# Patient Record
Sex: Female | Born: 2000
Health system: Southern US, Community
[De-identification: ages and names within clinical notes are randomized; demographics above are authoritative.]

## PROBLEM LIST (undated history)

## (undated) DIAGNOSIS — F419 Anxiety disorder, unspecified: Secondary | ICD-10-CM

## (undated) DIAGNOSIS — K760 Fatty (change of) liver, not elsewhere classified: Secondary | ICD-10-CM

## (undated) DIAGNOSIS — L309 Dermatitis, unspecified: Secondary | ICD-10-CM

## (undated) DIAGNOSIS — J069 Acute upper respiratory infection, unspecified: Secondary | ICD-10-CM

## (undated) DIAGNOSIS — L509 Urticaria, unspecified: Secondary | ICD-10-CM

## (undated) DIAGNOSIS — T7840XA Allergy, unspecified, initial encounter: Secondary | ICD-10-CM

## (undated) DIAGNOSIS — F909 Attention-deficit hyperactivity disorder, unspecified type: Secondary | ICD-10-CM

## (undated) DIAGNOSIS — T783XXA Angioneurotic edema, initial encounter: Secondary | ICD-10-CM

## (undated) HISTORY — PX: TONSILLECTOMY: SUR1361

## (undated) HISTORY — DX: Allergy, unspecified, initial encounter: T78.40XA

## (undated) HISTORY — DX: Dermatitis, unspecified: L30.9

## (undated) HISTORY — DX: Anxiety disorder, unspecified: F41.9

## (undated) HISTORY — DX: Fatty (change of) liver, not elsewhere classified: K76.0

## (undated) HISTORY — PX: UPPER GASTROINTESTINAL ENDOSCOPY: SHX188

## (undated) HISTORY — PX: MYRINGOTOMY WITH TUBE PLACEMENT: SHX5663

## (undated) HISTORY — DX: Urticaria, unspecified: L50.9

## (undated) HISTORY — PX: TYMPANOSTOMY TUBE PLACEMENT: SHX32

## (undated) HISTORY — DX: Angioneurotic edema, initial encounter: T78.3XXA

## (undated) HISTORY — PX: ADENOIDECTOMY: SUR15

## (undated) HISTORY — DX: Acute upper respiratory infection, unspecified: J06.9

## (undated) HISTORY — PX: OTHER SURGICAL HISTORY: SHX169

---
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
## 2018-01-31 ENCOUNTER — Encounter: Payer: Self-pay | Admitting: Neurology

## 2018-01-31 ENCOUNTER — Ambulatory Visit: Payer: 59 | Admitting: Neurology

## 2018-01-31 VITALS — BP 104/69 | HR 61 | Ht 60.0 in | Wt 165.0 lb

## 2018-01-31 DIAGNOSIS — R202 Paresthesia of skin: Secondary | ICD-10-CM | POA: Diagnosis not present

## 2018-01-31 NOTE — Progress Notes (Signed)
PATIENT: Leslie [name] DOB: 09-May-2001  Chief Complaint  Patient presents with  . Skin Sensations    She is here with her mother, Leslie [name].  Reports extreme cold sensations in her feet for the last several years - significant enough to cause pain.    Marland Kitchen. PCP    Leslie AstersSummer, Jennifer, MD     HISTORICAL  Leslie [name] is 17 years old female, seen in refer by her primary care doctor Summer, Leslie [name] for evaluation of feeling cold in her feet for the last several years, initial evaluation was on January 31, 2018, she is accompanied by her mother at today's clinical visit.  She has past medical history of ADHD, began to take medication since 6 grade, initially was treated with Concerta, since 2017, it was changed to Vyvanse  She began to feel bilateral feet coldness sensation around 2017, especially in cold weather, it is to the point of painful sensation, she has to get up and walk around, which helps some, she often feels coldness in her feet even during the summertime.  She denies bilateral hands coldness sensation, denies neck pain, low back pain, denies gait abnormality, denies bowel and bladder incontinence.  She plays volleyball regularly, denies difficulty  REVIEW OF SYSTEMS: Full 14 system review of systems performed and notable only for as above  ALLERGIES: No Known Allergies  HOME MEDICATIONS: Current Outpatient Medications  Medication Sig Dispense Refill  . lisdexamfetamine (VYVANSE) 40 MG capsule Take 40 mg by mouth every morning.     No current facility-administered medications for this visit.     PAST MEDICAL HISTORY: Past Medical History:  Diagnosis Date  . ADHD (attention deficit hyperactivity disorder)     PAST SURGICAL HISTORY: Past Surgical History:  Procedure Laterality Date  . MYRINGOTOMY WITH TUBE PLACEMENT    . TONSILLECTOMY    . tubes    . wisdom teeth      FAMILY HISTORY: Family History  Adopted: Yes    SOCIAL HISTORY:  Social  History   Socioeconomic History  . Marital status: Single    Spouse name: Not on file  . Number of children: 0  . Years of education: 1512  . Highest education level: Not on file  Social Needs  . Financial resource strain: Not on file  . Food insecurity - worry: Not on file  . Food insecurity - inability: Not on file  . Transportation needs - medical: Not on file  . Transportation needs - non-medical: Not on file  Occupational History  . Occupation: Consulting civil engineertudent  Tobacco Use  . Smoking status: Never Smoker  . Smokeless tobacco: Never Used  Substance and Sexual Activity  . Alcohol use: No    Alcohol/week: 0.0 oz  . Drug use: No  . Sexual activity: Not on file  Other Topics Concern  . Not on file  Social History Narrative   Lives at home with parents.   Right-handed.   2 sodas per day.         PHYSICAL EXAM   Vitals:   01/31/18 0817  BP: 104/69  Pulse: 61  Weight: 165 lb (74.8 kg)  Height: 5' (1.524 m)    Not recorded      Body mass index is 32.22 kg/m.  PHYSICAL EXAMNIATION:  Gen: NAD, conversant, well nourised, obese, well groomed                     Cardiovascular: Regular rate rhythm, no peripheral edema,  warm, nontender. Eyes: Conjunctivae clear without exudates or hemorrhage Neck: Supple, no carotid bruits. Pulmonary: Clear to auscultation bilaterally   NEUROLOGICAL EXAM:  MENTAL STATUS: Speech:    Speech is normal; fluent and spontaneous with normal comprehension.  Cognition:     Orientation to time, place and person     Normal recent and remote memory     Normal Attention span and concentration     Normal Language, naming, repeating,spontaneous speech     Fund of knowledge   CRANIAL NERVES: CN II: Visual fields are full to confrontation. Fundoscopic exam is normal with sharp discs and no vascular changes. Pupils are round equal and briskly reactive to light. CN III, IV, VI: extraocular movement are normal. No ptosis. CN V: Facial sensation is  intact to pinprick in all 3 divisions bilaterally. Corneal responses are intact.  CN VII: Face is symmetric with normal eye closure and smile. CN VIII: Hearing is normal to rubbing fingers CN IX, X: Palate elevates symmetrically. Phonation is normal. CN XI: Head turning and shoulder shrug are intact CN XII: Tongue is midline with normal movements and no atrophy.  MOTOR: There is no pronator drift of out-stretched arms. Muscle bulk and tone are normal. Muscle strength is normal.  REFLEXES: Reflexes are 2+ and symmetric at the biceps, triceps, knees, and ankles. Plantar responses are flexor.  SENSORY: Intact to light touch, pinprick, positional sensation and vibratory sensation are intact in fingers and toes.  COORDINATION: Rapid alternating movements and fine finger movements are intact. There is no dysmetria on finger-to-nose and heel-knee-shin.    GAIT/STANCE: Posture is normal. Gait is steady with normal steps, base, arm swing, and turning. Heel and toe walking are normal. Tandem gait is normal.  Romberg is absent.   DIAGNOSTIC DATA (LABS, IMAGING, TESTING) - I reviewed patient records, labs, notes, testing and imaging myself where available.   ASSESSMENT AND PLAN  Leslie [name] is a 17 y.o. female   Bilateral feet feeling cold  Laboratory evaluation to rule out common etiology such as anemia, thyroid malfunction, also check inflammatory markers  She has normal neurological examination, there is no evidence of peripheral neuropathy  I will call her for laboratory results    Leslie [name], M.D. Ph.D.  [facility] [address] [place], [place] [address] Ph: [phone] Fax: [phone]  [id number], Leslie Dike, MD

## 2018-02-01 ENCOUNTER — Telehealth: Payer: Self-pay | Admitting: Neurology

## 2018-02-01 LAB — CBC WITH DIFFERENTIAL/PLATELET
Basophils Absolute: 0.1 10*3/uL (ref 0.0–0.2)
Basos: 1 %
EOS (ABSOLUTE): 0.2 10*3/uL (ref 0.0–0.4)
Eos: 2 %
HEMOGLOBIN: 13.9 g/dL (ref 11.1–15.9)
Hematocrit: 41.6 % (ref 34.0–46.6)
Immature Grans (Abs): 0 10*3/uL (ref 0.0–0.1)
Immature Granulocytes: 0 %
LYMPHS ABS: 2.9 10*3/uL (ref 0.7–3.1)
Lymphs: 31 %
MCH: 29 pg (ref 26.6–33.0)
MCHC: 33.4 g/dL (ref 31.5–35.7)
MCV: 87 fL (ref 79–97)
MONOCYTES: 7 %
MONOS ABS: 0.7 10*3/uL (ref 0.1–0.9)
NEUTROS ABS: 5.7 10*3/uL (ref 1.4–7.0)
Neutrophils: 59 %
Platelets: 270 10*3/uL (ref 150–379)
RBC: 4.79 x10E6/uL (ref 3.77–5.28)
RDW: 14.6 % (ref 12.3–15.4)
WBC: 9.7 10*3/uL (ref 3.4–10.8)

## 2018-02-01 LAB — COMPREHENSIVE METABOLIC PANEL
ALBUMIN: 4.5 g/dL (ref 3.5–5.5)
ALT: 27 IU/L (ref 0–32)
AST: 22 IU/L (ref 0–40)
Albumin/Globulin Ratio: 1.7 (ref 1.2–2.2)
Alkaline Phosphatase: 97 IU/L (ref 43–101)
BILIRUBIN TOTAL: 0.3 mg/dL (ref 0.0–1.2)
BUN/Creatinine Ratio: 20 (ref 9–23)
BUN: 13 mg/dL (ref 6–20)
CHLORIDE: 103 mmol/L (ref 96–106)
CO2: 24 mmol/L (ref 20–29)
Calcium: 9.5 mg/dL (ref 8.7–10.2)
Creatinine, Ser: 0.66 mg/dL (ref 0.57–1.00)
GFR calc Af Amer: 149 mL/min/{1.73_m2} (ref >59)
GFR calc non Af Amer: 129 mL/min/{1.73_m2} (ref >59)
GLUCOSE: 89 mg/dL (ref 65–99)
Globulin, Total: 2.7 g/dL (ref 1.5–4.5)
Potassium: 4.1 mmol/L (ref 3.5–5.2)
SODIUM: 141 mmol/L (ref 134–144)
Total Protein: 7.2 g/dL (ref 6.0–8.5)

## 2018-02-01 LAB — ANA W/REFLEX IF POSITIVE
Anti JO-1: <0.2 AI (ref 0.0–0.9)
Anti Nuclear Antibody(ANA): POSITIVE — AB
Chromatin Ab SerPl-aCnc: 3 AI — ABNORMAL HIGH (ref 0.0–0.9)
DSDNA AB: 1 [IU]/mL (ref 0–9)
ENA SM Ab Ser-aCnc: 0.2 AI (ref 0.0–0.9)
ENA SSA (RO) Ab: 0.6 AI (ref 0.0–0.9)
ENA SSB (LA) Ab: <0.2 AI (ref 0.0–0.9)
Scleroderma SCL-70: <0.2 AI (ref 0.0–0.9)

## 2018-02-01 LAB — VITAMIN D 25 HYDROXY (VIT D DEFICIENCY, FRACTURES): Vit D, 25-Hydroxy: 13.4 ng/mL — ABNORMAL LOW (ref 30.0–100.0)

## 2018-02-01 LAB — VITAMIN B12: VITAMIN B 12: 155 pg/mL — AB (ref 232–1245)

## 2018-02-01 LAB — FERRITIN: Ferritin: 27 ng/mL (ref 15–77)

## 2018-02-01 LAB — RPR: RPR: NONREACTIVE

## 2018-02-01 LAB — C-REACTIVE PROTEIN: CRP: 1.8 mg/L (ref 0.0–4.9)

## 2018-02-01 LAB — SEDIMENTATION RATE: Sed Rate: 2 mm/hr (ref 0–32)

## 2018-02-01 NOTE — Telephone Encounter (Signed)
Please call patient, laboratory evaluation showed B12 deficiency, and vitamin D deficiency, level 13.  Vitamin B12 level was 155, she would benefit Vit B12 im supplement daily xone week, then weekly for one month.  Vit D3  supplement 1000 units, 3 tabs daily  Rest of the laboratory evaluation showed no significant abnormality.

## 2018-02-02 NOTE — Telephone Encounter (Signed)
Spoke with mother Boone MasterCherrie and reviewed below lab results, rec. for B12 inj. and Vit. D3 3,000iu once daily.  She verbalized understanding of same; first B12 appt. sched. for 02/06/18/fim

## 2018-02-05 NOTE — Telephone Encounter (Signed)
Patient is only to follow up with Dr. Terrace ArabiaYan if symptoms fail to improve (normal neuro exam).  Otherwise, she will continue care with her PCP.  I will fax over her lab results and recommended vitamin B12 injection schedule.  We have spoken with her mother, Harlow OhmsCherrie Arrowood on HIPAA, who is aware of this plan.

## 2018-02-06 ENCOUNTER — Ambulatory Visit: Payer: Self-pay

## 2018-02-07 MED ORDER — CYANOCOBALAMIN 1000 MCG/ML IJ SOLN: 1000.0000 ug | Freq: Once | INTRAMUSCULAR | 0 refills | Status: DC

## 2018-02-07 NOTE — Addendum Note (Signed)
Addended by: Lindell SparKIRKMAN, MICHELLE C on: 02/07/2018 05:09 PM   Modules accepted: Orders

## 2018-02-07 NOTE — Telephone Encounter (Signed)
Spoke to patient's mother - she will come to our office tomorrow for her first B12 injection.  She then plans to come back next week and will have injections Monday - Thursday.  After next week, she will start weekly injections for four weeks then monthly injections for one year.   Her mother plans to get her established with an adult PCP.  She is currently still under the care of her pediatrician.

## 2018-02-07 NOTE — Telephone Encounter (Signed)
Pt's mother called PCP does not give B12 injections. Please call to advise [phone]

## 2018-02-08 ENCOUNTER — Ambulatory Visit: Payer: 59 | Admitting: *Deleted

## 2018-02-08 ENCOUNTER — Other Ambulatory Visit: Payer: Self-pay | Admitting: *Deleted

## 2018-02-08 ENCOUNTER — Encounter: Payer: Self-pay | Admitting: *Deleted

## 2018-02-08 DIAGNOSIS — E538 Deficiency of other specified B group vitamins: Secondary | ICD-10-CM

## 2018-02-08 MED ORDER — CYANOCOBALAMIN 1000 MCG/ML IJ SOLN
1000.0000 ug | Freq: Once | INTRAMUSCULAR | Status: AC
  Administered 2018-02-08: 1000 ug via INTRAMUSCULAR

## 2018-02-08 MED ORDER — CYANOCOBALAMIN 1000 MCG/ML IJ SOLN
1000.0000 ug | INTRAMUSCULAR | Status: AC
  Administered 2018-02-21 – 2018-03-14 (×4): 1000 ug via INTRAMUSCULAR

## 2018-02-08 MED ORDER — CYANOCOBALAMIN 1000 MCG/ML IJ SOLN
1000.0000 ug | Freq: Every day | INTRAMUSCULAR | Status: AC
  Administered 2018-02-12 – 2018-02-15 (×4): 1000 ug via INTRAMUSCULAR

## 2018-02-08 MED ORDER — CYANOCOBALAMIN 1000 MCG/ML IJ SOLN
1000.0000 ug | INTRAMUSCULAR | Status: DC
  Administered 2018-04-13 – 2018-05-10 (×2): 1000 ug via INTRAMUSCULAR

## 2018-02-12 ENCOUNTER — Ambulatory Visit (INDEPENDENT_AMBULATORY_CARE_PROVIDER_SITE_OTHER): Payer: 59

## 2018-02-12 DIAGNOSIS — E538 Deficiency of other specified B group vitamins: Secondary | ICD-10-CM | POA: Diagnosis not present

## 2018-02-13 ENCOUNTER — Ambulatory Visit (INDEPENDENT_AMBULATORY_CARE_PROVIDER_SITE_OTHER): Payer: 59 | Admitting: *Deleted

## 2018-02-13 DIAGNOSIS — E538 Deficiency of other specified B group vitamins: Secondary | ICD-10-CM | POA: Diagnosis not present

## 2018-02-14 ENCOUNTER — Ambulatory Visit (INDEPENDENT_AMBULATORY_CARE_PROVIDER_SITE_OTHER): Payer: 59 | Admitting: *Deleted

## 2018-02-14 DIAGNOSIS — E538 Deficiency of other specified B group vitamins: Secondary | ICD-10-CM

## 2018-02-15 ENCOUNTER — Ambulatory Visit (INDEPENDENT_AMBULATORY_CARE_PROVIDER_SITE_OTHER): Payer: 59 | Admitting: *Deleted

## 2018-02-15 DIAGNOSIS — E538 Deficiency of other specified B group vitamins: Secondary | ICD-10-CM

## 2018-02-21 ENCOUNTER — Ambulatory Visit (INDEPENDENT_AMBULATORY_CARE_PROVIDER_SITE_OTHER): Payer: 59

## 2018-02-21 DIAGNOSIS — E538 Deficiency of other specified B group vitamins: Secondary | ICD-10-CM | POA: Diagnosis not present

## 2018-02-28 ENCOUNTER — Ambulatory Visit (INDEPENDENT_AMBULATORY_CARE_PROVIDER_SITE_OTHER): Payer: 59

## 2018-02-28 DIAGNOSIS — E538 Deficiency of other specified B group vitamins: Secondary | ICD-10-CM

## 2018-03-07 ENCOUNTER — Ambulatory Visit (INDEPENDENT_AMBULATORY_CARE_PROVIDER_SITE_OTHER): Payer: 59 | Admitting: *Deleted

## 2018-03-07 DIAGNOSIS — E538 Deficiency of other specified B group vitamins: Secondary | ICD-10-CM | POA: Diagnosis not present

## 2018-03-14 ENCOUNTER — Ambulatory Visit (INDEPENDENT_AMBULATORY_CARE_PROVIDER_SITE_OTHER): Payer: 59 | Admitting: *Deleted

## 2018-03-14 DIAGNOSIS — E538 Deficiency of other specified B group vitamins: Secondary | ICD-10-CM | POA: Diagnosis not present

## 2018-04-12 DIAGNOSIS — Z23 Encounter for immunization: Secondary | ICD-10-CM | POA: Diagnosis not present

## 2018-04-12 DIAGNOSIS — F902 Attention-deficit hyperactivity disorder, combined type: Secondary | ICD-10-CM | POA: Diagnosis not present

## 2018-04-13 ENCOUNTER — Ambulatory Visit (INDEPENDENT_AMBULATORY_CARE_PROVIDER_SITE_OTHER): Payer: 59 | Admitting: *Deleted

## 2018-04-13 DIAGNOSIS — E538 Deficiency of other specified B group vitamins: Secondary | ICD-10-CM

## 2018-04-24 MED FILL — VYVANSE 50 MG CAPSULE: 50 | 30 days supply | Qty: 30 | Fill #0

## 2018-05-10 ENCOUNTER — Ambulatory Visit (INDEPENDENT_AMBULATORY_CARE_PROVIDER_SITE_OTHER): Payer: 59 | Admitting: *Deleted

## 2018-05-10 ENCOUNTER — Other Ambulatory Visit: Payer: Self-pay | Admitting: Neurology

## 2018-05-10 DIAGNOSIS — E538 Deficiency of other specified B group vitamins: Secondary | ICD-10-CM | POA: Insufficient documentation

## 2018-05-10 NOTE — Progress Notes (Signed)
Order for B12, folic acid, and methylmalonic acid level

## 2018-05-10 NOTE — Progress Notes (Signed)
Gave  Vitmain B12 1066mcg/ml in left deltoid. Cleaned with alcohol wipe prior to injection. Band-aid applied. Pt tolerated well.

## 2018-05-13 LAB — FOLATE: Folate: 5.9 ng/mL (ref >3.0)

## 2018-05-13 LAB — METHYLMALONIC ACID, SERUM: METHYLMALONIC ACID: 173 nmol/L (ref 0–378)

## 2018-05-13 LAB — VITAMIN B12: Vitamin B-12: 389 pg/mL (ref 232–1245)

## 2018-05-14 ENCOUNTER — Telehealth: Payer: Self-pay | Admitting: *Deleted

## 2018-05-14 NOTE — Telephone Encounter (Signed)
-----   Message from Levert Feinstein, MD sent at 05/14/2018  5:29 PM EDT ----- Please call patient for normal Vit B12 result.

## 2018-05-15 ENCOUNTER — Encounter: Payer: Self-pay | Admitting: *Deleted

## 2018-05-15 NOTE — Telephone Encounter (Signed)
Dr. Terrace Arabia would like patient to take oral supplements of the following:  1) vitamin B12 1000 mcg per day 2) vitamin D3 2000 units per day  Spoke to patient's mother on DPR.  She is aware of the results and will purchase the recommended supplements for her daughter to start.

## 2018-06-11 ENCOUNTER — Ambulatory Visit: Payer: 59

## 2018-06-18 DIAGNOSIS — F900 Attention-deficit hyperactivity disorder, predominantly inattentive type: Secondary | ICD-10-CM | POA: Diagnosis not present

## 2018-08-07 DIAGNOSIS — Z68.41 Body mass index (BMI) pediatric, greater than or equal to 95th percentile for age: Secondary | ICD-10-CM | POA: Diagnosis not present

## 2018-08-07 DIAGNOSIS — L700 Acne vulgaris: Secondary | ICD-10-CM | POA: Diagnosis not present

## 2018-08-07 DIAGNOSIS — Z0001 Encounter for general adult medical examination with abnormal findings: Secondary | ICD-10-CM | POA: Diagnosis not present

## 2018-08-07 DIAGNOSIS — Z13220 Encounter for screening for lipoid disorders: Secondary | ICD-10-CM | POA: Diagnosis not present

## 2018-08-07 DIAGNOSIS — Z1331 Encounter for screening for depression: Secondary | ICD-10-CM | POA: Diagnosis not present

## 2018-08-07 DIAGNOSIS — F900 Attention-deficit hyperactivity disorder, predominantly inattentive type: Secondary | ICD-10-CM | POA: Diagnosis not present

## 2018-08-07 DIAGNOSIS — Z713 Dietary counseling and surveillance: Secondary | ICD-10-CM | POA: Diagnosis not present

## 2018-08-07 MED FILL — VYVANSE 50 MG CAPSULE: 50 | 30 days supply | Qty: 30 | Fill #0

## 2018-09-19 DIAGNOSIS — H5213 Myopia, bilateral: Secondary | ICD-10-CM | POA: Diagnosis not present

## 2018-10-18 DIAGNOSIS — F900 Attention-deficit hyperactivity disorder, predominantly inattentive type: Secondary | ICD-10-CM | POA: Diagnosis not present

## 2018-10-18 DIAGNOSIS — F902 Attention-deficit hyperactivity disorder, combined type: Secondary | ICD-10-CM | POA: Diagnosis not present

## 2018-10-18 DIAGNOSIS — J157 Pneumonia due to Mycoplasma pneumoniae: Secondary | ICD-10-CM | POA: Diagnosis not present

## 2018-10-18 DIAGNOSIS — E559 Vitamin D deficiency, unspecified: Secondary | ICD-10-CM | POA: Diagnosis not present

## 2018-10-18 DIAGNOSIS — E538 Deficiency of other specified B group vitamins: Secondary | ICD-10-CM | POA: Diagnosis not present

## 2018-10-18 DIAGNOSIS — R799 Abnormal finding of blood chemistry, unspecified: Secondary | ICD-10-CM | POA: Diagnosis not present

## 2018-10-18 MED FILL — AZITHROMYCIN 250 MG TABLET: 250 | 5 days supply | Qty: 6 | Fill #0

## 2018-10-18 MED FILL — VYVANSE 50 MG CAPSULE: 50 | 30 days supply | Qty: 30 | Fill #0

## 2019-01-18 MED FILL — VYVANSE 50 MG CAPSULE: 50 | 30 days supply | Qty: 30 | Fill #0

## 2019-01-23 ENCOUNTER — Ambulatory Visit: Payer: 59 | Admitting: Sports Medicine

## 2019-01-25 DIAGNOSIS — Z130 Encounter for screening for diseases of the blood and blood-forming organs and certain disorders involving the immune mechanism: Secondary | ICD-10-CM | POA: Diagnosis not present

## 2019-02-01 DIAGNOSIS — J029 Acute pharyngitis, unspecified: Secondary | ICD-10-CM | POA: Diagnosis not present

## 2019-02-01 DIAGNOSIS — B338 Other specified viral diseases: Secondary | ICD-10-CM | POA: Diagnosis not present

## 2019-02-01 MED FILL — OSELTAMIVIR PHOS 75 MG CAP: 75 | 5 days supply | Qty: 10 | Fill #0

## 2019-02-03 ENCOUNTER — Encounter: Payer: Self-pay | Admitting: Family Medicine

## 2019-02-03 ENCOUNTER — Ambulatory Visit (INDEPENDENT_AMBULATORY_CARE_PROVIDER_SITE_OTHER): Payer: Self-pay | Admitting: Family Medicine

## 2019-02-03 VITALS — BP 110/70 | HR 86 | Temp 97.9°F | Resp 16 | Wt 151.0 lb

## 2019-02-03 DIAGNOSIS — H6690 Otitis media, unspecified, unspecified ear: Secondary | ICD-10-CM

## 2019-02-03 DIAGNOSIS — R6889 Other general symptoms and signs: Secondary | ICD-10-CM

## 2019-02-03 DIAGNOSIS — R059 Cough, unspecified: Secondary | ICD-10-CM

## 2019-02-03 DIAGNOSIS — R05 Cough: Secondary | ICD-10-CM

## 2019-02-03 MED ORDER — CEFDINIR 300 MG PO CAPS: 600.0000 mg | ORAL_CAPSULE | Freq: Every day | ORAL | 0 refills | Status: AC

## 2019-02-03 MED ORDER — BENZONATATE 100 MG PO CAPS: 100.0000 mg | ORAL_CAPSULE | Freq: Three times a day (TID) | ORAL | 0 refills | Status: DC | PRN

## 2019-02-03 MED ORDER — AZELASTINE HCL 0.1 % NA SOLN: 1.0000 | Freq: Two times a day (BID) | NASAL | 0 refills | Status: DC

## 2019-02-03 NOTE — Patient Instructions (Addendum)
Otitis Media, Adult    Otitis media occurs when there is inflammation and fluid in the middle ear. Your middle ear is a part of the ear that contains bones for hearing as well as air that helps send sounds to your brain.  What are the causes?  This condition is caused by a blockage in the eustachian tube. This tube drains fluid from the ear to the back of the nose (nasopharynx). A blockage in this tube can be caused by an object or by swelling (edema) in the tube. Problems that can cause a blockage include:   A cold or other upper respiratory infection.   Allergies.   An irritant, such as tobacco smoke.   Enlarged adenoids. The adenoids are areas of soft tissue located high in the back of the throat, behind the nose and the roof of the mouth.   A mass in the nasopharynx.   Damage to the ear caused by pressure changes (barotrauma).  What are the signs or symptoms?  Symptoms of this condition include:   Ear pain.   A fever.   Decreased hearing.   A headache.   Tiredness (lethargy).   Fluid leaking from the ear.   Ringing in the ear.  How is this diagnosed?  This condition is diagnosed with a physical exam. During the exam your health care provider will use an instrument called an otoscope to look into your ear and check for redness, swelling, and fluid. He or she will also ask about your symptoms.  Your health care provider may also order tests, such as:   A test to check the movement of the eardrum (pneumatic otoscopy). This test is done by squeezing a small amount of air into the ear.   A test that changes air pressure in the middle ear to check how well the eardrum moves and whether the eustachian tube is working (tympanogram).  How is this treated?  This condition usually goes away on its own within 3-5 days. But if the condition is caused by a bacteria infection and does not go away own its own, or keeps coming back, your health care provider may:   Prescribe antibiotic medicines to treat the  infection.   Prescribe or recommend medicines to control pain.  Follow these instructions at home:   Take over-the-counter and prescription medicines only as told by your health care provider.   If you were prescribed an antibiotic medicine, take it as told by your health care provider. Do not stop taking the antibiotic even if you start to feel better.   Keep all follow-up visits as told by your health care provider. This is important.  Contact a health care provider if:   You have bleeding from your nose.   There is a lump on your neck.   You are not getting better in 5 days.   You feel worse instead of better.  Get help right away if:   You have severe pain that is not controlled with medicine.   You have swelling, redness, or pain around your ear.   You have stiffness in your neck.   A part of your face is paralyzed.   The bone behind your ear (mastoid) is tender when you touch it.   You develop a severe headache.  Summary   Otitis media is redness, soreness, and swelling of the middle ear.   This condition usually goes away on its own within 3-5 days.   If the problem does   not go away in 3-5 days, your health care provider may prescribe or recommend medicines to treat your symptoms.   If you were prescribed an antibiotic medicine, take it as told by your health care provider.  This information is not intended to replace advice given to you by your health care provider. Make sure you discuss any questions you have with your health care provider.  Document Released: 09/16/2004 Document Revised: 12/02/2016 Document Reviewed: 12/02/2016  Elsevier Interactive Patient Education  2019 Elsevier Inc[employer]      Influenza, Adult  Influenza, more commonly known as "the flu," is a viral infection that mainly affects the respiratory tract. The respiratory tract includes organs that help you breathe, such as the lungs, nose, and throat. The flu causes many symptoms similar to the common cold along with high  fever and body aches.  The flu spreads easily from person to person (is contagious). Getting a flu shot (influenza vaccination) every year is the best way to prevent the flu.  What are the causes?  This condition is caused by the influenza virus. You can get the virus by:   Breathing in droplets that are in the air from an infected person's cough or sneeze.   Touching something that has been exposed to the virus (has been contaminated) and then touching your mouth, nose, or eyes.  What increases the risk?  The following factors may make you more likely to get the flu:   Not washing or sanitizing your hands often.   Having close contact with many people during cold and flu season.   Touching your mouth, eyes, or nose without first washing or sanitizing your hands.   Not getting a yearly (annual) flu shot.  You may have a higher risk for the flu, including serious problems such as a lung infection (pneumonia), if you:   Are older than 65.   Are pregnant.   Have a weakened disease-fighting system (immune system). You may have a weakened immune system if you:  ? Have HIV or AIDS.  ? Are undergoing chemotherapy.  ? Are taking medicines that reduce (suppress) the activity of your immune system.   Have a long-term (chronic) illness, such as heart disease, kidney disease, diabetes, or lung disease.   Have a liver disorder.   Are severely overweight (morbidly obese).   Have anemia. This is a condition that affects your red blood cells.   Have asthma.  What are the signs or symptoms?  Symptoms of this condition usually begin suddenly and last 4-14 days. They may include:   Fever and chills.   Headaches, body aches, or muscle aches.   Sore throat.   Cough.   Runny or stuffy (congested) nose.   Chest discomfort.   Poor appetite.   Weakness or fatigue.   Dizziness.   Nausea or vomiting.  How is this diagnosed?  This condition may be diagnosed based on:   Your symptoms and medical history.   A physical  exam.   Swabbing your nose or throat and testing the fluid for the influenza virus.  How is this treated?  If the flu is diagnosed early, you can be treated with medicine that can help reduce how severe the illness is and how long it lasts (antiviral medicine). This may be given by mouth (orally) or through an IV.  Taking care of yourself at home can help relieve symptoms. Your health care provider may recommend:   Taking over-the-counter medicines.   Drinking plenty of   fluids.  In many cases, the flu goes away on its own. If you have severe symptoms or complications, you may be treated in a hospital.  Follow these instructions at home:  Activity   Rest as needed and get plenty of sleep.   Stay home from work or school as told by your health care provider. Unless you are visiting your health care provider, avoid leaving home until your fever has been gone for 24 hours without taking medicine.  Eating and drinking   Take an oral rehydration solution (ORS). This is a drink that is sold at pharmacies and retail stores.   Drink enough fluid to keep your urine pale yellow.   Drink clear fluids in small amounts as you are able. Clear fluids include water, ice chips, diluted fruit juice, and low-calorie sports drinks.   Eat bland, easy-to-digest foods in small amounts as you are able. These foods include bananas, applesauce, rice, lean meats, toast, and crackers.   Avoid drinking fluids that contain a lot of sugar or caffeine, such as energy drinks, regular sports drinks, and soda.   Avoid alcohol.   Avoid spicy or fatty foods.  General instructions          Take over-the-counter and prescription medicines only as told by your health care provider.   Use a cool mist humidifier to add humidity to the air in your home. This can make it easier to breathe.   Cover your mouth and nose when you cough or sneeze.   Wash your hands with soap and water often, especially after you cough or sneeze. If soap and water  are not available, use alcohol-based hand sanitizer.   Keep all follow-up visits as told by your health care provider. This is important.  How is this prevented?     Get an annual flu shot. You may get the flu shot in late summer, fall, or winter. Ask your health care provider when you should get your flu shot.   Avoid contact with people who are sick during cold and flu season. This is generally fall and winter.  Contact a health care provider if:   You develop new symptoms.   You have:  ? Chest pain.  ? Diarrhea.  ? A fever.   Your cough gets worse.   You produce more mucus.   You feel nauseous or you vomit.  Get help right away if:   You develop shortness of breath or difficulty breathing.   Your skin or nails turn a bluish color.   You have severe pain or stiffness in your neck.   You develop a sudden headache or sudden pain in your face or ear.   You cannot eat or drink without vomiting.  Summary   Influenza, more commonly known as "the flu," is a viral infection that primarily affects your respiratory tract.   Symptoms of the flu usually begin suddenly and last 4-14 days.   Getting an annual flu shot is the best way to prevent getting the flu.   Stay home from work or school as told by your health care provider. Unless you are visiting your health care provider, avoid leaving home until your fever has been gone for 24 hours without taking medicine.   Keep all follow-up visits as told by your health care provider. This is important.  This information is not intended to replace advice given to you by your health care provider. Make sure you discuss any questions you have with   your health care provider.  Document Released: 12/09/2000 Document Revised: 05/30/2018 Document Reviewed: 05/30/2018  Elsevier Interactive Patient Education  2019 Elsevier Inc.

## 2019-02-03 NOTE — Progress Notes (Signed)
Leslie [name] is a 18 y.o. female who presents today with 3 days of flu like symptoms after tamiflu was initiated by pediatrician in the last 72 hours despite negative flu test. She is a Leslie [name] and is here accompanied by Leslie [name] who is a Engineer, civil (consulting). She has been using the tamiflu as well as tylenol for symptoms. [name] is concerned about the continued cough and sore throat that patient exhibits. She reports an overall normal health history and denies any chronic health conditions. [place] does [place] campus and Leslie room mate is reportedly well at this time.    Review of Systems  Constitutional: Positive for chills, fever and malaise/fatigue.  HENT: Negative for congestion, ear discharge, ear pain, sinus pain and sore throat.   Eyes: Negative.   Respiratory: Positive for cough. Negative for sputum production and shortness of breath.   Cardiovascular: Negative.  Negative for chest pain.  Gastrointestinal: Negative for abdominal pain, diarrhea, nausea and vomiting.  Genitourinary: Negative for dysuria, frequency, hematuria and urgency.  Musculoskeletal: Negative for myalgias.  Skin: Negative.   Neurological: Negative for dizziness and headaches.  Endo/Heme/Allergies: Negative.   Psychiatric/Behavioral: Negative.     Leslie [name] has a current medication list which includes the following prescription(s): vitamin d3, lisdexamfetamine, oseltamivir, vitamin b-12, azelastine, azithromycin, benzonatate, cefdinir, and portia-28. Also has No Known Allergies.  Leslie [name]  has a past medical history of ADHD (attention deficit hyperactivity disorder). Also  has a past surgical history that includes Myringotomy with tube placement; tubes; Tonsillectomy; and wisdom teeth.    O: Vitals:   02/03/19 1543  BP: 110/70  Pulse: 86  Resp: 16  Temp: 97.9 F (36.6 C)  SpO2: 97%     Physical Exam Vitals signs (WNL-afebrile) reviewed.  Constitutional:      General: She is not in acute distress.  Appearance: Normal appearance. She is well-developed, well-groomed and normal weight. She is not ill-appearing, toxic-appearing or diaphoretic.  HENT:     Head: Normocephalic.     Right Ear: Hearing, ear canal and external ear normal. A middle ear effusion is present. There is no impacted cerumen. Tympanic membrane is injected and erythematous.     Left Ear: Hearing, ear canal and external ear normal. There is impacted cerumen. Tympanic membrane is injected.     Ears:     Comments: Impacted cerumen- ear irrigated and then TM visualized and is consistent appearance of TM erythema as right TM.    Nose: Congestion and rhinorrhea present.     Right Sinus: Frontal sinus tenderness present. No maxillary sinus tenderness.     Left Sinus: Frontal sinus tenderness present. No maxillary sinus tenderness.     Mouth/Throat:     Mouth: Mucous membranes are moist.     Pharynx: Uvula midline.  Neck:     Musculoskeletal: Normal range of motion and neck supple.  Cardiovascular:     Rate and Rhythm: Normal rate and regular rhythm.     Pulses: Normal pulses.     Heart sounds: Normal heart sounds.  Pulmonary:     Effort: Pulmonary effort is normal.     Breath sounds: Normal breath sounds. No decreased breath sounds, wheezing, rhonchi or rales.     Comments: Mild upper lobe congestion easily cleared with cough in office- cough was non productive  Abdominal:     General: Bowel sounds are normal.     Palpations: Abdomen is soft.  Musculoskeletal: Normal range of motion.  Lymphadenopathy:     Head:  Right side of head: Tonsillar adenopathy present. No submental or submandibular adenopathy.     Left side of head: Tonsillar adenopathy present. No submental or submandibular adenopathy.     Cervical: Cervical adenopathy present.     Right cervical: Superficial cervical adenopathy present.     Left cervical: Superficial cervical adenopathy present.  Neurological:     Mental Status: She is alert and oriented  to person, place, and time.  Psychiatric:        Behavior: Behavior is cooperative.    A: 1. Acute otitis media, unspecified otitis media type   2. Flu-like symptoms   3. Cough    P: Patient treated for influenza in the last 72 hours be pediatrician. Patient has been taking tamiflu but no other real treatment for supportive care- I suspect that this has complicated the healing process for the patient and now has a bacterial ear infection. She is AAO x 3 and NAD on exam today-she is a Leslie [name] in office with Leslie [name]- today VSS and real concern is just the need to initiate symptom management and will treat for the otitis- medication compliance importance was discussed with patient, as well as urgent emergent follow up reasons and local locations.  1. Acute otitis media, unspecified otitis media type - cefdinir (OMNICEF) 300 MG capsule; Take 2 capsules (600 mg total) by mouth daily for 5 days.  2. Flu-like symptoms - azelastine (ASTELIN) 0.1 % nasal spray; Place 1 spray into both nostrils 2 (two) times daily. Use in each nostril as directed  3. Cough - benzonatate (TESSALON) 100 MG capsule; Take 1 capsule (100 mg total) by mouth 3 (three) times daily as needed for cough (with full glass of water). Albuterol inhaler for cough support.   Discussed with patient exam findings, suspected diagnosis etiology and  reviewed recommended treatment plan and follow up, including complications and indications for urgent medical follow up and evaluation. Medications including use and indications reviewed with patient. Patient provided relevant patient education on diagnosis and/or relevant related condition that were discussed and reviewed with patient at discharge. Patient verbalized understanding of information provided and agrees with plan of care (POC), all questions answered.

## 2019-02-04 ENCOUNTER — Telehealth: Payer: Self-pay

## 2019-02-04 MED ORDER — ALBUTEROL SULFATE HFA 108 (90 BASE) MCG/ACT IN AERS: 2.0000 | INHALATION_SPRAY | Freq: Four times a day (QID) | RESPIRATORY_TRACT | 0 refills | Status: DC | PRN

## 2019-06-26 DIAGNOSIS — F900 Attention-deficit hyperactivity disorder, predominantly inattentive type: Secondary | ICD-10-CM | POA: Diagnosis not present

## 2019-07-04 DIAGNOSIS — F411 Generalized anxiety disorder: Secondary | ICD-10-CM | POA: Diagnosis not present

## 2019-07-04 DIAGNOSIS — F321 Major depressive disorder, single episode, moderate: Secondary | ICD-10-CM | POA: Diagnosis not present

## 2019-07-04 DIAGNOSIS — F900 Attention-deficit hyperactivity disorder, predominantly inattentive type: Secondary | ICD-10-CM | POA: Diagnosis not present

## 2019-07-08 DIAGNOSIS — F411 Generalized anxiety disorder: Secondary | ICD-10-CM | POA: Diagnosis not present

## 2019-07-08 DIAGNOSIS — F321 Major depressive disorder, single episode, moderate: Secondary | ICD-10-CM | POA: Diagnosis not present

## 2019-07-08 DIAGNOSIS — F900 Attention-deficit hyperactivity disorder, predominantly inattentive type: Secondary | ICD-10-CM | POA: Diagnosis not present

## 2019-07-11 ENCOUNTER — Other Ambulatory Visit: Payer: Self-pay | Admitting: Internal Medicine

## 2019-07-11 DIAGNOSIS — Z20822 Contact with and (suspected) exposure to covid-19: Secondary | ICD-10-CM

## 2019-07-11 DIAGNOSIS — R6889 Other general symptoms and signs: Secondary | ICD-10-CM | POA: Diagnosis not present

## 2019-07-15 DIAGNOSIS — F321 Major depressive disorder, single episode, moderate: Secondary | ICD-10-CM | POA: Diagnosis not present

## 2019-07-15 DIAGNOSIS — F900 Attention-deficit hyperactivity disorder, predominantly inattentive type: Secondary | ICD-10-CM | POA: Diagnosis not present

## 2019-07-15 DIAGNOSIS — F411 Generalized anxiety disorder: Secondary | ICD-10-CM | POA: Diagnosis not present

## 2019-07-16 LAB — NOVEL CORONAVIRUS, NAA: SARS-CoV-2, NAA: NOT DETECTED

## 2019-07-22 DIAGNOSIS — F900 Attention-deficit hyperactivity disorder, predominantly inattentive type: Secondary | ICD-10-CM | POA: Diagnosis not present

## 2019-07-22 DIAGNOSIS — F411 Generalized anxiety disorder: Secondary | ICD-10-CM | POA: Diagnosis not present

## 2019-07-22 DIAGNOSIS — F321 Major depressive disorder, single episode, moderate: Secondary | ICD-10-CM | POA: Diagnosis not present

## 2019-07-29 DIAGNOSIS — F900 Attention-deficit hyperactivity disorder, predominantly inattentive type: Secondary | ICD-10-CM | POA: Diagnosis not present

## 2019-07-29 DIAGNOSIS — F411 Generalized anxiety disorder: Secondary | ICD-10-CM | POA: Diagnosis not present

## 2019-07-29 DIAGNOSIS — F321 Major depressive disorder, single episode, moderate: Secondary | ICD-10-CM | POA: Diagnosis not present

## 2019-08-05 DIAGNOSIS — F900 Attention-deficit hyperactivity disorder, predominantly inattentive type: Secondary | ICD-10-CM | POA: Diagnosis not present

## 2019-08-05 DIAGNOSIS — F411 Generalized anxiety disorder: Secondary | ICD-10-CM | POA: Diagnosis not present

## 2019-08-05 DIAGNOSIS — F321 Major depressive disorder, single episode, moderate: Secondary | ICD-10-CM | POA: Diagnosis not present

## 2019-08-07 DIAGNOSIS — H52203 Unspecified astigmatism, bilateral: Secondary | ICD-10-CM | POA: Diagnosis not present

## 2019-08-09 DIAGNOSIS — Z20828 Contact with and (suspected) exposure to other viral communicable diseases: Secondary | ICD-10-CM | POA: Diagnosis not present

## 2019-08-09 DIAGNOSIS — Z7189 Other specified counseling: Secondary | ICD-10-CM | POA: Diagnosis not present

## 2019-08-27 DIAGNOSIS — Z20828 Contact with and (suspected) exposure to other viral communicable diseases: Secondary | ICD-10-CM | POA: Diagnosis not present

## 2019-12-17 DIAGNOSIS — R453 Demoralization and apathy: Secondary | ICD-10-CM | POA: Diagnosis not present

## 2019-12-17 DIAGNOSIS — R450 Nervousness: Secondary | ICD-10-CM | POA: Diagnosis not present

## 2019-12-17 DIAGNOSIS — R452 Unhappiness: Secondary | ICD-10-CM | POA: Diagnosis not present

## 2019-12-17 DIAGNOSIS — Z1331 Encounter for screening for depression: Secondary | ICD-10-CM | POA: Diagnosis not present

## 2019-12-17 DIAGNOSIS — F900 Attention-deficit hyperactivity disorder, predominantly inattentive type: Secondary | ICD-10-CM | POA: Diagnosis not present

## 2019-12-17 DIAGNOSIS — Z713 Dietary counseling and surveillance: Secondary | ICD-10-CM | POA: Diagnosis not present

## 2019-12-17 DIAGNOSIS — Z79899 Other long term (current) drug therapy: Secondary | ICD-10-CM | POA: Diagnosis not present

## 2019-12-17 DIAGNOSIS — Z0001 Encounter for general adult medical examination with abnormal findings: Secondary | ICD-10-CM | POA: Diagnosis not present

## 2019-12-17 MED FILL — VYVANSE 50 MG CAPSULE: 50 | 30 days supply | Qty: 30 | Fill #0

## 2019-12-17 MED FILL — SERTRALINE HCL 25 MG TABLET: 25 | 30 days supply | Qty: 30 | Fill #0

## 2020-01-15 MED FILL — SERTRALINE HCL 25 MG TABLET: 25 | 30 days supply | Qty: 30 | Fill #1

## 2020-01-16 DIAGNOSIS — F419 Anxiety disorder, unspecified: Secondary | ICD-10-CM | POA: Diagnosis not present

## 2020-01-16 DIAGNOSIS — R450 Nervousness: Secondary | ICD-10-CM | POA: Diagnosis not present

## 2020-02-14 MED FILL — SERTRALINE HCL 25 MG TABLET: 25 | 30 days supply | Qty: 30 | Fill #2

## 2020-03-23 MED FILL — SERTRALINE HCL 25 MG TABLET: 25 | 30 days supply | Qty: 30 | Fill #0

## 2020-03-23 MED FILL — VYVANSE 50 MG CAPSULE: 50 | 30 days supply | Qty: 30 | Fill #0

## 2020-04-03 ENCOUNTER — Other Ambulatory Visit (HOSPITAL_COMMUNITY): Payer: Self-pay | Admitting: Medical

## 2020-04-03 DIAGNOSIS — Z111 Encounter for screening for respiratory tuberculosis: Secondary | ICD-10-CM | POA: Diagnosis not present

## 2020-04-03 DIAGNOSIS — F900 Attention-deficit hyperactivity disorder, predominantly inattentive type: Secondary | ICD-10-CM | POA: Diagnosis not present

## 2020-04-03 DIAGNOSIS — F419 Anxiety disorder, unspecified: Secondary | ICD-10-CM | POA: Diagnosis not present

## 2020-04-03 DIAGNOSIS — Z79899 Other long term (current) drug therapy: Secondary | ICD-10-CM | POA: Diagnosis not present

## 2020-04-08 DIAGNOSIS — Z111 Encounter for screening for respiratory tuberculosis: Secondary | ICD-10-CM | POA: Diagnosis not present

## 2020-04-16 DIAGNOSIS — Z23 Encounter for immunization: Secondary | ICD-10-CM | POA: Diagnosis not present

## 2020-04-28 MED FILL — SERTRALINE HCL 25 MG TABLET: 25 | 30 days supply | Qty: 30 | Fill #0

## 2020-05-16 DIAGNOSIS — M545 Low back pain: Secondary | ICD-10-CM | POA: Diagnosis not present

## 2020-05-16 DIAGNOSIS — R300 Dysuria: Secondary | ICD-10-CM | POA: Diagnosis not present

## 2020-05-16 DIAGNOSIS — R102 Pelvic and perineal pain: Secondary | ICD-10-CM | POA: Diagnosis not present

## 2020-05-19 MED FILL — AMOXICILLIN 875 MG TABS: 875 | 10 days supply | Qty: 20 | Fill #0

## 2020-05-21 ENCOUNTER — Other Ambulatory Visit: Payer: Self-pay | Admitting: Nurse Practitioner

## 2020-05-21 ENCOUNTER — Encounter: Payer: Self-pay | Admitting: Nurse Practitioner

## 2020-05-21 ENCOUNTER — Ambulatory Visit (INDEPENDENT_AMBULATORY_CARE_PROVIDER_SITE_OTHER): Payer: 59 | Admitting: Nurse Practitioner

## 2020-05-21 ENCOUNTER — Other Ambulatory Visit: Payer: Self-pay

## 2020-05-21 VITALS — BP 124/78 | Ht 61.0 in | Wt 183.0 lb

## 2020-05-21 DIAGNOSIS — M545 Low back pain, unspecified: Secondary | ICD-10-CM

## 2020-05-21 DIAGNOSIS — R1030 Lower abdominal pain, unspecified: Secondary | ICD-10-CM

## 2020-05-21 DIAGNOSIS — N898 Other specified noninflammatory disorders of vagina: Secondary | ICD-10-CM | POA: Diagnosis not present

## 2020-05-21 DIAGNOSIS — Z30011 Encounter for initial prescription of contraceptive pills: Secondary | ICD-10-CM | POA: Diagnosis not present

## 2020-05-21 LAB — WET PREP FOR TRICH, YEAST, CLUE

## 2020-05-21 MED ORDER — LEVONORGESTREL-ETHINYL ESTRAD 0.15-30 MG-MCG PO TABS: 1.0000 | ORAL_TABLET | Freq: Every day | ORAL | 4 refills | Status: DC

## 2020-05-21 NOTE — Progress Notes (Signed)
Acute Office Visit  Subjective:    Patient ID: Leslie [name], female    DOB: 2001/04/10, 18 y.o.   MRN: [id number]  Chief Complaint  Patient presents with  . New Patient (Initial Visit)    jk,gardasil completed  . Back Pain  . Abdominal Pain    severity pain 8  . Urinary Frequency    HPI Presents today with her mom for lower abdominal pain, back pain, and urinary frequency that began 1 week ago. Describes the abdominal pain as lower, sharp, intermittent, 8/10 pain level. Back pain is lower, bilateral, and described as dull.  Her mom has noticed vaginal discharge while doing laundry and would like to check for infection.  Patient denies vaginal itching or odor.  Still having some frequency, denies dysuria and hematuria.  Was seen 05/19/2020 by pediatric office for the urinary symptoms and was treated with a 10-day course of amoxicillin, she is currently on day 2. Urine culture showed Group B but patient wanted to continue the antibiotic.  Has not been sexually active in 3 to 4 months, used condom, denies need for STD screening. Has had Gardasil series.  She is adopted and does not know family history. On OCPs with regular cycles. Mother concerned that patient is ordering them online and would like a prescription for these.   Review of Systems  Constitutional: Negative.  Negative for appetite change, chills and fever.  Gastrointestinal: Positive for abdominal pain (lower). Negative for abdominal distention, constipation, diarrhea, nausea and vomiting.  Genitourinary: Positive for flank pain (bilateral), frequency, pelvic pain (bilateral) and vaginal discharge. Negative for dysuria, hematuria and menstrual problem.  Musculoskeletal: Positive for back pain (lower).       Objective:    Physical Exam Constitutional:      Appearance: She is obese.  Abdominal:     General: There is no distension.     Tenderness: There is no abdominal tenderness. There is right CVA tenderness and left  CVA tenderness. There is no guarding. Negative signs include Murphy's sign and Rovsing's sign.  Genitourinary:    Vagina: Vaginal discharge (white, thin) present.     Cervix: Discharge (white, thin) present. No cervical motion tenderness.     Uterus: Normal. Not enlarged and not tender.      Adnexa: Right adnexa normal and left adnexa normal.       Right: No tenderness.         Left: No tenderness.       BP 124/78 (BP Location: Right Arm, Patient Position: Sitting, Cuff Size: Large)   Ht 5\' 1"  (1.549 m)   Wt 183 lb (83 kg)   LMP 05/01/2020   BMI 34.58 kg/m  Wt Readings from Last 3 Encounters:  05/21/20 183 lb (83 kg)  02/03/19 151 lb (68.5 kg) (82 %, Z= 0.92)*  01/31/18 165 lb (74.8 kg) (92 %, Z= 1.37)*   * Growth percentiles are based on CDC (Girls, 2-20 Years) data.   Wet prep negative     Assessment & Plan:   Problem List Items Addressed This Visit    None    Visit Diagnoses    Oral contraception initial prescription    -  Primary   Relevant Medications   levonorgestrel-ethinyl estradiol (KURVELO) 0.15-30 MG-MCG tablet   Vaginal discharge       Relevant Orders   WET PREP FOR TRICH, YEAST, CLUE   Acute bilateral low back pain without sciatica       Relevant Orders  US PELVIS TRANSVAGINAL NON-OB (TV ONLY)   Lower abdominal pain       Relevant Orders   US PELVIS TRANSVAGINAL NON-OB (TV ONLY)     Plan: We will schedule ultrasound to rule out abnormalities.  Prescription for OCP sent to pharmacy.  Instructed to finish full course of antibiotics that was prescribed by pediatrician.  Instructed to go to emergency room if patient begins to have severe abdominal pain.  Follow-up as needed if symptoms worsen or do not improve.     Tamela Gammon [facility], 3:37 PM 05/21/2020

## 2020-05-28 ENCOUNTER — Encounter: Payer: Self-pay | Admitting: Nurse Practitioner

## 2020-05-28 ENCOUNTER — Ambulatory Visit (INDEPENDENT_AMBULATORY_CARE_PROVIDER_SITE_OTHER): Payer: 59

## 2020-05-28 ENCOUNTER — Ambulatory Visit: Payer: 59 | Admitting: Nurse Practitioner

## 2020-05-28 ENCOUNTER — Other Ambulatory Visit: Payer: Self-pay

## 2020-05-28 VITALS — BP 110/78

## 2020-05-28 DIAGNOSIS — M545 Low back pain, unspecified: Secondary | ICD-10-CM

## 2020-05-28 DIAGNOSIS — R1030 Lower abdominal pain, unspecified: Secondary | ICD-10-CM

## 2020-05-28 DIAGNOSIS — N854 Malposition of uterus: Secondary | ICD-10-CM | POA: Diagnosis not present

## 2020-05-28 MED FILL — MARLISSA-28 TABLET: 0.15-30 | 28 days supply | Qty: 28 | Fill #0

## 2020-05-28 NOTE — Progress Notes (Signed)
History: Here following pelvic ultrasound for the lower abdominal pain and back pain that began 2 weeks ago.  Described the back pain as lower, bilateral, and dull.  Abdominal pain was lower, sharp, intermittent, 8 out of 10 pain level.Was treated 05/19/2020 at pediatric office for UTI.   Exam: Appears well. Reports resolution of symptoms Ultrasound: Anteverted uterus, normal size and shape, no myometrial masses.  Thin, symmetrical endometrium.  No masses or thickening seen.  Both ovaries normal size with normal follicle pattern and normal perfusion.  No adnexal masses, no free fluid.  Assessment: Normal ultrasound  Plan: Reassurance provided on normal ultrasound findings.  Patient is adopted so there is a lot of question regarding genetics. Most likely related to UTI. Follow up if symptoms return.

## 2020-06-10 MED FILL — SERTRALINE HCL 25 MG TABLET: 25 | 30 days supply | Qty: 30 | Fill #1

## 2020-06-24 MED FILL — MARLISSA-28 TABLET: 0.15-30 | 84 days supply | Qty: 84 | Fill #1

## 2020-07-27 MED FILL — SERTRALINE HCL 25 MG TABLET: 25 | 30 days supply | Qty: 30 | Fill #2

## 2020-07-29 MED FILL — VYVANSE 50 MG CAPSULE: 50 | 30 days supply | Qty: 30 | Fill #0

## 2020-08-07 DIAGNOSIS — F419 Anxiety disorder, unspecified: Secondary | ICD-10-CM | POA: Diagnosis not present

## 2020-08-07 DIAGNOSIS — Z1331 Encounter for screening for depression: Secondary | ICD-10-CM | POA: Diagnosis not present

## 2020-09-11 MED FILL — FLUARIX QUADRIVALENT 0.5 ML: 0.5 | 1 days supply | Qty: 1 | Fill #0

## 2020-09-16 MED FILL — MARLISSA-28 TABLET: 0.15-30 | 84 days supply | Qty: 84 | Fill #2

## 2020-09-28 MED FILL — SERTRALINE HCL 25 MG TABLET: 25 | 30 days supply | Qty: 30 | Fill #3

## 2020-10-16 ENCOUNTER — Ambulatory Visit: Payer: 59

## 2020-10-16 NOTE — Progress Notes (Deleted)
   Covid-19 Vaccination Clinic  Name:  Leslie [name]    MRN: [id number] DOB: 03/05/2001  10/16/2020  Leslie [name] was observed post Covid-19 immunization for 15 minutes without incident. Leslie [name] was provided with Vaccine Information Sheet and instruction to access the V-Safe system. Vaccinated by Pam Ward.  [name] was instructed to call 911 with any severe reactions post vaccine: . Difficulty breathing  . Swelling of face and throat  . A fast heartbeat  . A bad rash all over body  . Dizziness and weakness      

## 2020-10-16 NOTE — Progress Notes (Unsigned)
   Covid-19 Vaccination Clinic  Name:  Leslie [name]    MRN: [id number] DOB: [birth year]  10/16/2020  [name] was observed post Covid-19 immunization for 15 minutes without incident. She was provided with Vaccine Information Sheet and instruction to access the V-Safe system. Vaccinated by [facility] Ward.  [name] was instructed to call 911 with any severe reactions post vaccine: Marland Kitchen Difficulty breathing  . Swelling of face and throat  . A fast heartbeat  . A bad rash all over body  . Dizziness and weakness

## 2020-10-22 ENCOUNTER — Other Ambulatory Visit (HOSPITAL_COMMUNITY): Payer: Self-pay | Admitting: Pediatrics

## 2020-10-22 MED FILL — VYVANSE 50 MG CAPSULE: 50 | 30 days supply | Qty: 30 | Fill #0

## 2020-10-27 DIAGNOSIS — R300 Dysuria: Secondary | ICD-10-CM | POA: Diagnosis not present

## 2020-10-27 DIAGNOSIS — Z113 Encounter for screening for infections with a predominantly sexual mode of transmission: Secondary | ICD-10-CM | POA: Diagnosis not present

## 2020-10-27 DIAGNOSIS — R350 Frequency of micturition: Secondary | ICD-10-CM | POA: Diagnosis not present

## 2020-10-29 ENCOUNTER — Other Ambulatory Visit (HOSPITAL_COMMUNITY): Payer: Self-pay | Admitting: Medical

## 2020-10-29 DIAGNOSIS — Z1331 Encounter for screening for depression: Secondary | ICD-10-CM | POA: Diagnosis not present

## 2020-10-29 DIAGNOSIS — R450 Nervousness: Secondary | ICD-10-CM | POA: Diagnosis not present

## 2020-10-29 DIAGNOSIS — F419 Anxiety disorder, unspecified: Secondary | ICD-10-CM | POA: Diagnosis not present

## 2020-10-29 MED FILL — SERTRALINE HCL 50 MG TABLET: 50 | 30 days supply | Qty: 30 | Fill #0

## 2020-11-04 DIAGNOSIS — H6122 Impacted cerumen, left ear: Secondary | ICD-10-CM | POA: Diagnosis not present

## 2020-11-04 DIAGNOSIS — R450 Nervousness: Secondary | ICD-10-CM | POA: Diagnosis not present

## 2020-11-04 DIAGNOSIS — H65191 Other acute nonsuppurative otitis media, right ear: Secondary | ICD-10-CM | POA: Diagnosis not present

## 2020-11-04 DIAGNOSIS — J029 Acute pharyngitis, unspecified: Secondary | ICD-10-CM | POA: Diagnosis not present

## 2020-11-04 DIAGNOSIS — Z1152 Encounter for screening for COVID-19: Secondary | ICD-10-CM | POA: Diagnosis not present

## 2020-11-07 DIAGNOSIS — J029 Acute pharyngitis, unspecified: Secondary | ICD-10-CM | POA: Diagnosis not present

## 2020-11-07 DIAGNOSIS — H6642 Suppurative otitis media, unspecified, left ear: Secondary | ICD-10-CM | POA: Diagnosis not present

## 2020-11-07 DIAGNOSIS — J069 Acute upper respiratory infection, unspecified: Secondary | ICD-10-CM | POA: Diagnosis not present

## 2020-11-09 DIAGNOSIS — E569 Vitamin deficiency, unspecified: Secondary | ICD-10-CM | POA: Diagnosis not present

## 2020-11-09 DIAGNOSIS — H6642 Suppurative otitis media, unspecified, left ear: Secondary | ICD-10-CM | POA: Diagnosis not present

## 2020-11-09 DIAGNOSIS — H6643 Suppurative otitis media, unspecified, bilateral: Secondary | ICD-10-CM | POA: Diagnosis not present

## 2020-11-09 DIAGNOSIS — Z1152 Encounter for screening for COVID-19: Secondary | ICD-10-CM | POA: Diagnosis not present

## 2020-11-09 DIAGNOSIS — J189 Pneumonia, unspecified organism: Secondary | ICD-10-CM | POA: Diagnosis not present

## 2020-11-09 DIAGNOSIS — R059 Cough, unspecified: Secondary | ICD-10-CM | POA: Diagnosis not present

## 2020-11-09 DIAGNOSIS — H1031 Unspecified acute conjunctivitis, right eye: Secondary | ICD-10-CM | POA: Diagnosis not present

## 2020-11-17 ENCOUNTER — Other Ambulatory Visit (HOSPITAL_COMMUNITY): Payer: Self-pay | Admitting: Pediatrics

## 2020-12-04 MED FILL — SERTRALINE HCL 50 MG TABLET: 50 | 60 days supply | Qty: 60 | Fill #0

## 2020-12-04 MED FILL — MARLISSA-28 TABLET: 0.15-30 | 84 days supply | Qty: 84 | Fill #3

## 2020-12-07 MED FILL — VYVANSE 50 MG CAPSULE: 50 | 60 days supply | Qty: 60 | Fill #0

## 2020-12-28 DIAGNOSIS — F32A Depression, unspecified: Secondary | ICD-10-CM | POA: Diagnosis not present

## 2020-12-28 DIAGNOSIS — E538 Deficiency of other specified B group vitamins: Secondary | ICD-10-CM | POA: Diagnosis not present

## 2020-12-28 DIAGNOSIS — E559 Vitamin D deficiency, unspecified: Secondary | ICD-10-CM | POA: Diagnosis not present

## 2020-12-28 DIAGNOSIS — F902 Attention-deficit hyperactivity disorder, combined type: Secondary | ICD-10-CM | POA: Diagnosis not present

## 2020-12-28 DIAGNOSIS — Z0000 Encounter for general adult medical examination without abnormal findings: Secondary | ICD-10-CM | POA: Diagnosis not present

## 2020-12-28 DIAGNOSIS — F419 Anxiety disorder, unspecified: Secondary | ICD-10-CM | POA: Diagnosis not present

## 2020-12-29 DIAGNOSIS — Z0000 Encounter for general adult medical examination without abnormal findings: Secondary | ICD-10-CM | POA: Diagnosis not present

## 2020-12-29 DIAGNOSIS — F419 Anxiety disorder, unspecified: Secondary | ICD-10-CM | POA: Diagnosis not present

## 2020-12-29 DIAGNOSIS — E559 Vitamin D deficiency, unspecified: Secondary | ICD-10-CM | POA: Diagnosis not present

## 2020-12-29 DIAGNOSIS — E538 Deficiency of other specified B group vitamins: Secondary | ICD-10-CM | POA: Diagnosis not present

## 2021-01-04 DIAGNOSIS — R899 Unspecified abnormal finding in specimens from other organs, systems and tissues: Secondary | ICD-10-CM | POA: Diagnosis not present

## 2021-03-01 MED FILL — MARLISSA-28 TABLET: 0.15-30 | 84 days supply | Qty: 84 | Fill #4

## 2021-03-29 ENCOUNTER — Other Ambulatory Visit (HOSPITAL_COMMUNITY): Payer: Self-pay

## 2021-03-29 DIAGNOSIS — F902 Attention-deficit hyperactivity disorder, combined type: Secondary | ICD-10-CM | POA: Diagnosis not present

## 2021-03-29 DIAGNOSIS — F419 Anxiety disorder, unspecified: Secondary | ICD-10-CM | POA: Diagnosis not present

## 2021-03-29 MED ORDER — SERTRALINE HCL 50 MG PO TABS
50.0000 mg | ORAL_TABLET | Freq: Every day | ORAL | 3 refills | Status: DC
  Filled 2021-03-29: qty 90, 90d supply, fill #0
  Filled 2021-07-01: qty 90, 90d supply, fill #1

## 2021-05-28 ENCOUNTER — Other Ambulatory Visit (HOSPITAL_COMMUNITY): Payer: Self-pay

## 2021-05-28 ENCOUNTER — Telehealth: Payer: Self-pay | Admitting: *Deleted

## 2021-05-28 ENCOUNTER — Other Ambulatory Visit: Payer: Self-pay | Admitting: Nurse Practitioner

## 2021-05-28 DIAGNOSIS — Z30011 Encounter for initial prescription of contraceptive pills: Secondary | ICD-10-CM

## 2021-05-28 MED ORDER — LEVONORGESTREL-ETHINYL ESTRAD 0.15-30 MG-MCG PO TABS
1.0000 | ORAL_TABLET | Freq: Every day | ORAL | 0 refills | Status: DC
  Filled 2021-05-28: qty 84, 84d supply, fill #0

## 2021-05-28 NOTE — Telephone Encounter (Signed)
Patient has annual exam scheduled on 07/05/21, needs refill on birth control pills. Rx sent.

## 2021-06-30 DIAGNOSIS — F902 Attention-deficit hyperactivity disorder, combined type: Secondary | ICD-10-CM | POA: Diagnosis not present

## 2021-07-01 ENCOUNTER — Other Ambulatory Visit (HOSPITAL_COMMUNITY): Payer: Self-pay

## 2021-07-01 MED ORDER — VYVANSE 50 MG PO CAPS
50.0000 mg | ORAL_CAPSULE | Freq: Every morning | ORAL | 0 refills | Status: DC
  Filled 2021-07-01: qty 90, 90d supply, fill #0

## 2021-07-05 ENCOUNTER — Encounter: Payer: Self-pay | Admitting: Nurse Practitioner

## 2021-07-05 ENCOUNTER — Other Ambulatory Visit (HOSPITAL_COMMUNITY)
Admission: RE | Admit: 2021-07-05 | Discharge: 2021-07-05 | Disposition: A | Discharge status: Home / Self Care | Payer: 59 | Source: Ambulatory Visit | Attending: Nurse Practitioner | Admitting: Nurse Practitioner

## 2021-07-05 ENCOUNTER — Other Ambulatory Visit (HOSPITAL_COMMUNITY): Payer: Self-pay

## 2021-07-05 ENCOUNTER — Ambulatory Visit: Payer: 59 | Admitting: Nurse Practitioner

## 2021-07-05 ENCOUNTER — Ambulatory Visit (INDEPENDENT_AMBULATORY_CARE_PROVIDER_SITE_OTHER): Payer: 59 | Admitting: Nurse Practitioner

## 2021-07-05 ENCOUNTER — Other Ambulatory Visit: Payer: Self-pay

## 2021-07-05 VITALS — BP 110/62 | HR 84 | Ht 60.0 in | Wt 190.0 lb

## 2021-07-05 DIAGNOSIS — Z01419 Encounter for gynecological examination (general) (routine) without abnormal findings: Secondary | ICD-10-CM

## 2021-07-05 DIAGNOSIS — Z3041 Encounter for surveillance of contraceptive pills: Secondary | ICD-10-CM | POA: Diagnosis not present

## 2021-07-05 MED ORDER — LEVONORGESTREL-ETHINYL ESTRAD 0.15-30 MG-MCG PO TABS
1.0000 | ORAL_TABLET | Freq: Every day | ORAL | 3 refills | Status: DC
Start: 2021-07-05 — End: 2022-03-15
  Filled 2021-07-05 – 2021-08-18 (×2): qty 84, 84d supply, fill #0
  Filled 2021-11-14: qty 84, 84d supply, fill #1

## 2021-07-05 NOTE — Progress Notes (Signed)
   Leslie [name] May 17, 2001 [id number]   History:  20 y.o. G0 presents for annual exam without GYN complaints. Monthly cycle/OCPs. Not currently sexually active, denies need for STD screening.   Gynecologic History Patient's last menstrual period was 06/15/2021 (approximate).   Contraception/Family planning: OCP (estrogen/progesterone)  Health Maintenance Last Pap: Never Last mammogram: Not indicated Last colonoscopy: Not indicated Last Dexa: Not indicated   Past medical history, past surgical history, family history and social history were all reviewed and documented in the EPIC chart. At [employer] [facility] [employer] [employer] with plans for nursing. 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   ROS:  A ROS was performed and pertinent positives and negatives are included.  Exam:  Vitals:   07/05/21 1532  BP: 110/62  Pulse: 84  SpO2: 100%  Weight: 190 lb (86.2 kg)  Height: 5' (1.524 m)   Body mass index is 37.11 kg/m.  General appearance:  Normal Thyroid:  Symmetrical, normal in size, without palpable masses or nodularity. Respiratory  Auscultation:  Clear without wheezing or rhonchi Cardiovascular  Auscultation:  Regular rate, without rubs, murmurs or gallops  Edema/varicosities:  Not grossly evident Abdominal  Soft,nontender, without masses, guarding or rebound.  Liver/spleen:  No organomegaly noted  Hernia:  None appreciated  Skin  Inspection:  Grossly normal Breasts: Not indicated per guidelines Genitourinary   Inguinal/mons:  Normal without inguinal adenopathy  External genitalia:  Normal appearing vulva with no masses, tenderness, or lesions  BUS/Urethra/Skene's glands:  Normal  Vagina:  Normal appearing with normal color and discharge, no lesions  Cervix:  Normal appearing without discharge or lesions  Uterus:  Normal in size, shape and contour.  Midline and mobile, nontender  Adnexa/parametria:     Rt: Normal in size, without masses or tenderness.   Lt: Normal in size, without masses or  tenderness.  Anus and perineum: Normal  Patient informed chaperone available to be present for breast and pelvic exam. Patient has requested no chaperone to be present. Patient has been advised what will be completed during breast and pelvic exam.   Assessment/Plan:  20 y.o. G0 for annual exam.   Well female exam with routine gynecological exam - Plan: Cytology - PAP( [facility]). Education provided on SBEs, importance of preventative screenings, current guidelines, high calcium diet, regular exercise, and multivitamin daily.   Encounter for surveillance of contraceptive pills - Plan: levonorgestrel-ethinyl estradiol (NORDETTE) 0.15-30 MG-MCG tablet daily. Taking as prescribed. Monthly cycles. Refills x 1 year provided.   Screening for cervical cancer - Pap today.   Return in 1 year for annual.    Olivia Mackie DNP, 4:00 PM 07/05/2021

## 2021-07-12 LAB — CYTOLOGY - PAP: Diagnosis: NEGATIVE

## 2021-07-29 ENCOUNTER — Other Ambulatory Visit (HOSPITAL_COMMUNITY): Payer: Self-pay

## 2021-07-29 DIAGNOSIS — R509 Fever, unspecified: Secondary | ICD-10-CM | POA: Diagnosis not present

## 2021-07-29 DIAGNOSIS — R6883 Chills (without fever): Secondary | ICD-10-CM | POA: Diagnosis not present

## 2021-07-29 DIAGNOSIS — J029 Acute pharyngitis, unspecified: Secondary | ICD-10-CM | POA: Diagnosis not present

## 2021-07-29 MED ORDER — AZITHROMYCIN 250 MG PO TABS
ORAL_TABLET | ORAL | 0 refills | Status: DC
  Filled 2021-07-29: qty 6, 5d supply, fill #0

## 2021-07-29 MED ORDER — CARESTART COVID-19 HOME TEST VI KIT
PACK | 0 refills | Status: DC
  Filled 2021-07-29: qty 4, 4d supply, fill #0

## 2021-08-18 ENCOUNTER — Other Ambulatory Visit (HOSPITAL_COMMUNITY): Payer: Self-pay

## 2021-08-19 ENCOUNTER — Other Ambulatory Visit (HOSPITAL_COMMUNITY): Payer: Self-pay

## 2021-09-16 ENCOUNTER — Other Ambulatory Visit (HOSPITAL_COMMUNITY): Payer: Self-pay

## 2021-09-16 DIAGNOSIS — M25551 Pain in right hip: Secondary | ICD-10-CM | POA: Diagnosis not present

## 2021-09-16 MED ORDER — TRAMADOL HCL 50 MG PO TABS
50.0000 mg | ORAL_TABLET | Freq: Four times a day (QID) | ORAL | 0 refills | Status: DC | PRN
  Filled 2021-09-16: qty 20, 5d supply, fill #0

## 2021-11-11 DIAGNOSIS — F419 Anxiety disorder, unspecified: Secondary | ICD-10-CM | POA: Diagnosis not present

## 2021-11-11 DIAGNOSIS — L700 Acne vulgaris: Secondary | ICD-10-CM | POA: Diagnosis not present

## 2021-11-11 DIAGNOSIS — F902 Attention-deficit hyperactivity disorder, combined type: Secondary | ICD-10-CM | POA: Diagnosis not present

## 2021-11-12 ENCOUNTER — Other Ambulatory Visit (HOSPITAL_COMMUNITY): Payer: Self-pay

## 2021-11-15 ENCOUNTER — Other Ambulatory Visit (HOSPITAL_COMMUNITY): Payer: Self-pay

## 2021-11-16 ENCOUNTER — Other Ambulatory Visit (HOSPITAL_COMMUNITY): Payer: Self-pay

## 2021-11-16 MED ORDER — VYVANSE 30 MG PO CAPS
30.0000 mg | ORAL_CAPSULE | Freq: Every morning | ORAL | 0 refills | Status: DC
  Filled 2021-11-16: qty 30, 30d supply, fill #0

## 2021-12-02 ENCOUNTER — Other Ambulatory Visit (HOSPITAL_COMMUNITY): Payer: Self-pay

## 2021-12-02 DIAGNOSIS — L700 Acne vulgaris: Secondary | ICD-10-CM | POA: Diagnosis not present

## 2021-12-02 MED ORDER — CLINDAMYCIN PHOS-BENZOYL PEROX 1.2-5 % EX GEL
1.0000 "application " | Freq: Every morning | CUTANEOUS | 2 refills | Status: DC
  Filled 2021-12-02: qty 45, 30d supply, fill #0

## 2021-12-02 MED ORDER — DROSPIRENONE-ETHINYL ESTRADIOL 3-0.03 MG PO TABS
1.0000 | ORAL_TABLET | Freq: Every day | ORAL | 11 refills | Status: DC
  Filled 2021-12-02: qty 28, 28d supply, fill #0
  Filled 2022-01-02: qty 28, 28d supply, fill #1
  Filled 2022-02-06: qty 28, 28d supply, fill #2

## 2021-12-06 ENCOUNTER — Other Ambulatory Visit (HOSPITAL_COMMUNITY): Payer: Self-pay

## 2021-12-06 DIAGNOSIS — F902 Attention-deficit hyperactivity disorder, combined type: Secondary | ICD-10-CM | POA: Diagnosis not present

## 2021-12-06 DIAGNOSIS — L700 Acne vulgaris: Secondary | ICD-10-CM | POA: Diagnosis not present

## 2021-12-06 DIAGNOSIS — F419 Anxiety disorder, unspecified: Secondary | ICD-10-CM | POA: Diagnosis not present

## 2021-12-06 MED ORDER — VYVANSE 30 MG PO CAPS
30.0000 mg | ORAL_CAPSULE | Freq: Every morning | ORAL | 0 refills | Status: DC
  Filled 2022-01-04: qty 90, 90d supply, fill #0

## 2021-12-22 ENCOUNTER — Other Ambulatory Visit (HOSPITAL_COMMUNITY): Payer: Self-pay

## 2021-12-22 DIAGNOSIS — H6692 Otitis media, unspecified, left ear: Secondary | ICD-10-CM | POA: Diagnosis not present

## 2021-12-22 MED ORDER — AZITHROMYCIN 250 MG PO TABS
ORAL_TABLET | ORAL | 0 refills | Status: DC
  Filled 2021-12-22: qty 6, 5d supply, fill #0

## 2022-01-03 ENCOUNTER — Other Ambulatory Visit (HOSPITAL_COMMUNITY): Payer: Self-pay

## 2022-01-03 MED ORDER — SERTRALINE HCL 50 MG PO TABS
50.0000 mg | ORAL_TABLET | Freq: Every day | ORAL | 3 refills | Status: DC
  Filled 2022-01-03: qty 90, 90d supply, fill #0
  Filled 2022-05-24: qty 90, 90d supply, fill #1

## 2022-01-04 ENCOUNTER — Other Ambulatory Visit (HOSPITAL_COMMUNITY): Payer: Self-pay

## 2022-02-04 DIAGNOSIS — M7591 Shoulder lesion, unspecified, right shoulder: Secondary | ICD-10-CM | POA: Diagnosis not present

## 2022-02-04 DIAGNOSIS — M25511 Pain in right shoulder: Secondary | ICD-10-CM | POA: Diagnosis not present

## 2022-02-04 DIAGNOSIS — S46001A Unspecified injury of muscle(s) and tendon(s) of the rotator cuff of right shoulder, initial encounter: Secondary | ICD-10-CM | POA: Diagnosis not present

## 2022-02-04 DIAGNOSIS — M542 Cervicalgia: Secondary | ICD-10-CM | POA: Diagnosis not present

## 2022-02-07 ENCOUNTER — Other Ambulatory Visit (HOSPITAL_COMMUNITY): Payer: Self-pay

## 2022-02-14 NOTE — Therapy (Signed)
OUTPATIENT PHYSICAL THERAPY SHOULDER EVALUATION   Patient Name: Leslie [name] MRN: [id number] DOB:June 15, 2001, 21 y.o., female Today's Date: 02/15/2022   PT End of Session - 02/15/22 1723     Visit Number 1    Date for PT Re-Evaluation 04/12/22    Authorization Type UMR review after 25 visits    PT Start Time 1615    PT Stop Time 1655    PT Time Calculation (min) 40 min    Activity Tolerance Patient tolerated treatment well             Past Medical History:  Diagnosis Date   ADHD (attention deficit hyperactivity disorder)    Anxiety    Past Surgical History:  Procedure Laterality Date   MYRINGOTOMY WITH TUBE PLACEMENT     TONSILLECTOMY     tubes     wisdom teeth     Patient Active Problem List   Diagnosis Date Noted   Vitamin B12 deficiency 05/10/2018   B12 deficiency 02/08/2018    PCP: Fatima Sanger, FNP  REFERRING PROVIDER: Cherie Dark, PA  REFERRING DIAG: injury to right rotator cuff  THERAPY DIAG:  Right shoulder pain   ONSET DATE: 12/26/2013  SUBJECTIVE:                                                                                                                                                                                      SUBJECTIVE STATEMENT: Overuse learning to serve volleyball 8 years ago (21 y.o) torn rotator cuff only 10% left.  Had cortisone shot 1 week ago still in a lot of pain.  Didn't help.  Goals strengthen arm and lift arm more than 20 sec without pain.  Shoulder pops a lot.    PERTINENT HISTORY: X-rays   PAIN:  Are you having pain? Yes NPRS scale: 8/10 Pain location: anterior right shoulder and posterior near axilla Pain orientation: Right  PAIN TYPE: aching Pain description: constant  Aggravating factors: sleeping on right side arm goes numb; reaching overhead; hard to put hair up and brushing up Relieving factors: advil  PRECAUTIONS: None  WEIGHT BEARING RESTRICTIONS No  FALLS:  Has patient fallen in  last 6 months? 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  OBJECTIVE:   DIAGNOSTIC FINDINGS:  X-ray "rotator cuff issue"  PATIENT SURVEYS:  FOTO 64%        POSTURE: WNLs  UPPER EXTREMITY AROM/PROM: Decreased right scapular mobility with shoulder elevation ; no winging with wall push up  A/PROM  Right  Left   Shoulder flexion 148 168  Shoulder extension    Shoulder abduction 155 173  Shoulder adduction    Shoulder internal rotation T10 pain T4  Shoulder external rotation C7 T1  Elbow flexion    Elbow extension    Wrist flexion    Wrist extension    Wrist ulnar deviation    Wrist radial deviation    Wrist pronation    Wrist supination    (Blank rows = not tested)  UPPER EXTREMITY MMT:  MMT Right  Left   Shoulder flexion 4 5  Shoulder extension    Shoulder abduction 4- 5  Shoulder adduction    Shoulder internal rotation 4 5  Shoulder external rotation 4 5  Middle trapezius    Lower trapezius    Elbow flexion    Elbow extension    Wrist flexion    Wrist extension    Wrist ulnar deviation    Wrist radial deviation    Wrist pronation    Wrist supination    Grip strength (lbs)    (Blank rows = not tested)  SHOULDER SPECIAL TESTS:  Impingement tests: Neer impingement test: positive     Rotator cuff assessment: Drop arm test: positive , Empty can test: positive , External rotation lag sign: negative, and Internal rotation lag sign: negative    JOINT MOBILITY TESTING:  Mild glenohumeral and scapular hypomobility   PALPATION:  Tender points in upper traps, teres, infraspinatus and supraspinatus   TODAY'S TREATMENT:  The patient has had DN in the past for knee pain and had an excellent response.  She is open to manual therapy/soft tissue mobilization and DN of right upper trap, teres, infraspinatus, supraspinatus muscles.   Multiple twitch response produced and improved soft tissue length noted.  Much improved flexion and internal rotation ROM after DN/manual therapy.     PATIENT EDUCATION: Education details: prone row, extensions, horizontal abduction; DN after care Person educated: Patient Education method: Explanation, Demonstration, and Handouts Education comprehension: verbalized understanding and returned demonstration   HOME EXERCISE PROGRAM: Access Code: [facility] URL: [email or web address] Date: 02/15/2022 Prepared by: Lavinia Sharps  Exercises Prone Shoulder Extension - Single Arm - 1 x daily - 7 x weekly - 1 sets - 10 reps Prone Shoulder Row - 1 x daily - 7 x weekly - 1 sets - 10 reps Prone Single Arm Shoulder Horizontal Abduction with Scapular Retraction and Palm Down - 1 x daily - 7 x weekly - 1 sets - 10 reps   ASSESSMENT:  CLINICAL IMPRESSION: Patient is a 21 y.o. female who was seen today for physical therapy evaluation and treatment for right shoulder pain.   The pain began 8 years ago when she was 21 years old playing volleyball.  She reports continued anterior shoulder pain as well as pain posterior shoulder on a fairly constant basis interfering with overhead reaching, lifting and lying on her right side.  Limited flexion, abduction and internal rotation ROM.  Decreased strength particularly shoulder abduction.  + rotator cuff tests including empty can test and pain with resisted abduction.  Decreased scapular mobility and underuse of scapular/shoulder stabilizer muscles.     OBJECTIVE IMPAIRMENTS decreased ROM, decreased strength, increased fascial restrictions, impaired UE functional use, and pain.   ACTIVITY LIMITATIONS cleaning, community activity, driving, and occupation.   PERSONAL FACTORS Time since onset of injury/illness/exacerbation are also affecting patient's functional outcome.    REHAB POTENTIAL: Good  CLINICAL DECISION MAKING:  Stable/uncomplicated  EVALUATION COMPLEXITY: Low  GOALS: Goals reviewed with patient? Yes  SHORT TERM GOALS:  STG Name Target Date Goal status  1 The patient will demonstrate knowledge of basic self care strategies and ex's to promote healing Baseline:  03/15/2022 INITIAL  2 The patient will report a 40% improvement in right shoulder pain with right UE elevation with home and work (CNA) duties Baseline:  03/15/2022 INITIAL  3 Right shoulder flexion to 158, abduction to 163 needed for home and work ADLS Baseline: 03/15/2022 INITIAL  4  Baseline:    5  Baseline:    6   Baseline:    7   Baseline:     LONG TERM GOALS:   LTG Name Target Date Goal status  1 The patient will be independent in safe, self progression of HEP Baseline: 04/12/2022 INITIAL  2 The patient will report a 75% improvement in right shoulder pain with home and work ADLs Baseline: 04/12/2022 INITIAL  3 The patient will be able to perform overhead activity for 30 sec with pain level 3/10 Baseline: 04/12/2022 INITIAL  4 The patient will have grossly 4+/5 shoulder and scapular muscle strength to decrease rotator cuff overuse Baseline: 04/12/2022 INITIAL  5 Shoulder flexion 168, abduction 170, internal rotation to T8 needed for fixing her hair Baseline: 04/12/2022 INITIAL  6 FOTO score improved to 75% Baseline: 04/12/2022 INITIAL  7  Baseline: 04/12/2022 INITIAL   PLAN: PT FREQUENCY: 2x/week  PT DURATION: 8 weeks  PLANNED INTERVENTIONS: Therapeutic exercises, Therapeutic activity, Neuro Muscular re-education, Balance training, Gait training, Patient/Family education, Joint mobilization, Aquatic Therapy, Dry Needling, Electrical stimulation, Cryotherapy, Moist heat, Taping, Ultrasound, Ionotophoresis 4mg /ml Dexamethasone, and Manual therapy  PLAN FOR NEXT SESSION: assess response to DN#1 and continue possibly adding rhomboids;  review prone ex's from HEP as needed; further progress middle and lower trap  strengthening; band ex's  , PT 02/15/22 5:25 PM Phone: [phone] Fax: [phone]  [phone], PT 02/15/2022, 5:25 PM

## 2022-02-15 ENCOUNTER — Other Ambulatory Visit: Payer: Self-pay

## 2022-02-15 ENCOUNTER — Ambulatory Visit: Payer: 59 | Attending: Physician Assistant | Admitting: Physical Therapy

## 2022-02-15 ENCOUNTER — Encounter: Payer: Self-pay | Admitting: Physical Therapy

## 2022-02-15 DIAGNOSIS — M25511 Pain in right shoulder: Secondary | ICD-10-CM | POA: Diagnosis not present

## 2022-02-15 DIAGNOSIS — M25611 Stiffness of right shoulder, not elsewhere classified: Secondary | ICD-10-CM | POA: Diagnosis not present

## 2022-02-15 DIAGNOSIS — S46001A Unspecified injury of muscle(s) and tendon(s) of the rotator cuff of right shoulder, initial encounter: Secondary | ICD-10-CM | POA: Insufficient documentation

## 2022-02-15 DIAGNOSIS — M6281 Muscle weakness (generalized): Secondary | ICD-10-CM | POA: Diagnosis not present

## 2022-02-15 DIAGNOSIS — X503XXA Overexertion from repetitive movements, initial encounter: Secondary | ICD-10-CM | POA: Insufficient documentation

## 2022-02-24 ENCOUNTER — Ambulatory Visit: Payer: 59 | Attending: Physician Assistant | Admitting: Physical Therapy

## 2022-02-24 ENCOUNTER — Other Ambulatory Visit: Payer: Self-pay

## 2022-02-24 DIAGNOSIS — M6281 Muscle weakness (generalized): Secondary | ICD-10-CM | POA: Diagnosis not present

## 2022-02-24 DIAGNOSIS — M25611 Stiffness of right shoulder, not elsewhere classified: Secondary | ICD-10-CM | POA: Diagnosis not present

## 2022-02-24 NOTE — Therapy (Addendum)
OUTPATIENT PHYSICAL THERAPY TREATMENT NOTE   Patient Name: Leslie [name] MRN: [id number] DOB:2001/03/05, 21 y.o., female Today's Date: 02/24/2022  PCP: Holland Commons, FNP REFERRING PROVIDER: Drue Novel, PA   PT End of Session - 02/24/22 0756     Visit Number 2    Date for PT Re-Evaluation 04/12/22    Authorization Type UMR review after 25 visits    PT Start Time 0800    PT Stop Time 0840    PT Time Calculation (min) 40 min    Activity Tolerance Patient tolerated treatment well             Past Medical History:  Diagnosis Date   ADHD (attention deficit hyperactivity disorder)    Anxiety    Past Surgical History:  Procedure Laterality Date   MYRINGOTOMY WITH TUBE PLACEMENT     TONSILLECTOMY     tubes     wisdom teeth     Patient Active Problem List   Diagnosis Date Noted   Vitamin B12 deficiency 05/10/2018   B12 deficiency 02/08/2018    REFERRING DIAG: right shoulder pain  THERAPY DIAG:  Stiffness of right shoulder, not elsewhere classified  Muscle weakness (generalized)  PERTINENT HISTORY: 8 year history of shoulder pain starting when playing volleyball;  had DN in past for knee pain  PRECAUTIONS: none  SUBJECTIVE: I did good with the needling.  At night I have trouble getting to sleep b/c I feel like it's falling out of joint.  Could only do 3-5 reps of ex before spasm.    PAIN:  Are you having pain? Yes NPRS scale: 8/10 Pain location: right anterior shoulder and post scapular  Pain orientation: Right and Anterior    Aggravating factors: night time Relieving factors: DN    OBJECTIVE:    DIAGNOSTIC FINDINGS:  X-ray "rotator cuff issue"   PATIENT SURVEYS:  FOTO 64%                                    POSTURE: WNLs   UPPER EXTREMITY AROM/PROM: Decreased right scapular mobility with shoulder elevation ; no winging with wall push up   A/PROM Right   Left    Shoulder flexion 148 168  Shoulder extension      Shoulder  abduction 155 173  Shoulder adduction      Shoulder internal rotation T10 pain T4  Shoulder external rotation C7 T1  Elbow flexion      Elbow extension      Wrist flexion      Wrist extension      Wrist ulnar deviation      Wrist radial deviation      Wrist pronation      Wrist supination      (Blank rows = not tested)   UPPER EXTREMITY MMT:   MMT Right   Left    Shoulder flexion 4 5  Shoulder extension      Shoulder abduction 4- 5  Shoulder adduction      Shoulder internal rotation 4 5  Shoulder external rotation 4 5  Middle trapezius      Lower trapezius      Elbow flexion      Elbow extension      Wrist flexion      Wrist extension      Wrist ulnar deviation      Wrist radial deviation  Wrist pronation      Wrist supination      Grip strength (lbs)      (Blank rows = not tested)   SHOULDER SPECIAL TESTS:           Impingement tests: Neer impingement test: positive                       Rotator cuff assessment: Drop arm test: positive , Empty can test: positive , External rotation lag sign: negative, and Internal rotation lag sign: negative              JOINT MOBILITY TESTING:  Mild glenohumeral and scapular hypomobility    PALPATION:  Tender points in upper traps, teres, infraspinatus and supraspinatus                 OBJECTIVE findings from initial evaluation:    DIAGNOSTIC FINDINGS:  X-ray "rotator cuff issue"   PATIENT SURVEYS:  FOTO 64%                                    POSTURE: WNLs   UPPER EXTREMITY AROM/PROM: Decreased right scapular mobility with shoulder elevation ; no winging with wall push up   A/PROM Right   Left    Shoulder flexion 148 168  Shoulder extension      Shoulder abduction 155 173  Shoulder adduction      Shoulder internal rotation T10 pain T4  Shoulder external rotation C7 T1  Elbow flexion      Elbow extension      Wrist flexion      Wrist extension      Wrist ulnar deviation      Wrist radial deviation       Wrist pronation      Wrist supination      (Blank rows = not tested)   UPPER EXTREMITY MMT:   MMT Right   Left    Shoulder flexion 4 5  Shoulder extension      Shoulder abduction 4- 5  Shoulder adduction      Shoulder internal rotation 4 5  Shoulder external rotation 4 5  Middle trapezius      Lower trapezius      Elbow flexion      Elbow extension      Wrist flexion      Wrist extension      Wrist ulnar deviation      Wrist radial deviation      Wrist pronation      Wrist supination      Grip strength (lbs)      (Blank rows = not tested)   SHOULDER SPECIAL TESTS:           Impingement tests: Neer impingement test: positive                       Rotator cuff assessment: Drop arm test: positive , Empty can test: positive , External rotation lag sign: negative, and Internal rotation lag sign: negative              JOINT MOBILITY TESTING:  Mild glenohumeral and scapular hypomobility    PALPATION:  Tender points in upper traps, teres, infraspinatus and supraspinatus             TODAY'S TREATMENT 02/24/2022 Review of initial HEP prone rows, extensions, horizontal abduction 3-5 reps  Counter top push ups 10x Counter top shoulder taps 10x Small ball circles on wall 10x each way Yellow band tied in loop wall slides up and lift offs at the top 5x:  Manual therapy/Soft tissue mobilization to right pectorals, teres, rhomboid, levator, upper traps, infraspinatus, supraspinatus    Trigger Point Dry-Needling  Treatment instructions: Expect mild to moderate muscle soreness. S/S of pneumothorax if dry needled over a lung field, and to seek immediate medical attention should they occur. Patient verbalized understanding of these instructions and education.  Patient Consent Given: Yes Education handout provided: Previously provided Muscles treated: DN of right pectorals, levator scap, rhomboid, upper trap, teres, infraspinatus, supraspinatus muscles.  Multiple twitch response  produced and improved soft tissue length noted.  Much improved flexion and internal rotation ROM after DN/manual therapy.   Electrical stimulation performed: No Parameters: N/A Treatment response/outcome: Twitch response elicited and Palpable decrease in muscle tension   TODAY'S TREATMENT from initial evaluation:  The patient has had DN in the past for knee pain and had an excellent response.  She is open to manual therapy/soft tissue mobilization and DN of right upper trap, teres, infraspinatus, supraspinatus muscles.  Multiple twitch response produced and improved soft tissue length noted.  Much improved flexion and internal rotation ROM after DN/manual therapy.   PATIENT EDUCATION: Education details: prone row, extensions, horizontal abduction; DN after care Person educated: Patient Education method: Explanation, Demonstration, and Handouts Education comprehension: verbalized understanding and returned demonstration     HOME EXERCISE PROGRAM:  Access Code: [facility] URL: [email or web address] Date: 02/24/2022 Prepared by: Ruben Im  Exercises Prone Shoulder Extension - Single Arm - 1 x daily - 7 x weekly - 1 sets - 10 reps Prone Shoulder Row - 1 x daily - 7 x weekly - 1 sets - 10 reps Prone Single Arm Shoulder Horizontal Abduction with Scapular Retraction and Palm Down - 1 x daily - 7 x weekly - 1 sets - 10 reps Push ups on kitchen counter - 1 x daily - 7 x weekly - 1 sets - 10 reps Shoulder Taps on Table - 1 x daily - 7 x weekly - 1 sets - 10 reps Standing Wall Ball Circles with Mini Swiss Ball - 1 x daily - 7 x weekly - 2 sets - 10 reps Shoulder Flexion Wall Slide with Resistance Band - 1 x daily - 7 x weekly - 1 sets - 10 reps  ASSESSMENT:   CLINICAL IMPRESSION:  The patient had a positive response to initial DN with minimal soreness and continued improvements in ROM but reports continued anterior and posterior shoulder pain particularly at night.  She has the  sensation of instability.  Today's treatment focusing on stabilization ex's with closed chain emphasis with early muscular fatigue produced with minimal repetition.  Addressed soft tissue restriction including pectorals with numerous tender points identified.  Improved soft tissue mobility noted in teres and upper traps muscles compared to initial visit.     OBJECTIVE IMPAIRMENTS decreased ROM, decreased strength, increased fascial restrictions, impaired UE functional use, and pain.    ACTIVITY LIMITATIONS cleaning, community activity, driving, and occupation.    PERSONAL FACTORS Time since onset of injury/illness/exacerbation are also affecting patient's functional outcome.      REHAB POTENTIAL: Good   CLINICAL DECISION MAKING: Stable/uncomplicated   EVALUATION COMPLEXITY: Low     GOALS: Goals reviewed with patient? 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 Baseline:  03/15/2022 INITIAL  3 Right shoulder flexion to 158, abduction to 163 needed for home and work ADLS Baseline: [id number] INITIAL  4   Baseline:      5   Baseline:      6     Baseline:      7     Baseline:        LONG TERM GOALS:    LTG Name Target Date Goal status  1 The patient will be independent in safe, self progression of HEP Baseline: 04/12/2022 INITIAL  2 The patient will report a 75% improvement in right shoulder pain with home and work ADLs Baseline: 04/12/2022 INITIAL  3 The patient will be able to perform overhead activity for 30 sec with pain level 3/10 Baseline: 04/12/2022 INITIAL  4 The patient will have grossly 4+/5 shoulder and scapular muscle strength to decrease rotator cuff overuse Baseline: 04/12/2022 INITIAL  5 Shoulder  flexion 168, abduction 170, internal rotation to T8 needed for fixing her hair Baseline: 04/12/2022 INITIAL  6 FOTO score improved to 75% Baseline: 04/12/2022 INITIAL  7   Baseline: 04/12/2022 INITIAL    PLAN: PT FREQUENCY: 2x/week   PT DURATION: 8 weeks   PLANNED INTERVENTIONS: Therapeutic exercises, Therapeutic activity, Neuro Muscular re-education, Balance training, Gait training, Patient/Family education, Joint mobilization, Aquatic Therapy, Dry Needling, Electrical stimulation, Cryotherapy, Moist heat, Taping, Ultrasound, Ionotophoresis 4mg /ml Dexamethasone, and Manual therapy   PLAN FOR NEXT SESSION: assess response to DN#2;  shoulder stabilization ex's; middle and lower trap strengthening   URL: [email or web address] Date: 02/15/2022 Prepared by: Ruben Im   Exercises Prone Shoulder Extension - Single Arm - 1 x daily - 7 x weekly - 1 sets - 10 reps Prone Shoulder Row - 1 x daily - 7 x weekly - 1 sets - 10 reps Prone Single Arm Shoulder Horizontal Abduction with Scapular Retraction and Palm Down - 1 x daily - 7 x weekly - 1 sets - 10 reps         Alvera Singh, PT 02/24/2022, 7:56 AM    Ruben Im, PT 02/24/22 10:29 AM Phone: [phone] Fax: [phone]

## 2022-02-28 DIAGNOSIS — Z0000 Encounter for general adult medical examination without abnormal findings: Secondary | ICD-10-CM | POA: Diagnosis not present

## 2022-03-01 ENCOUNTER — Ambulatory Visit: Payer: 59

## 2022-03-01 ENCOUNTER — Encounter: Payer: 59 | Admitting: Physical Therapy

## 2022-03-01 ENCOUNTER — Other Ambulatory Visit (HOSPITAL_COMMUNITY): Payer: Self-pay

## 2022-03-01 ENCOUNTER — Other Ambulatory Visit: Payer: Self-pay

## 2022-03-01 DIAGNOSIS — M6281 Muscle weakness (generalized): Secondary | ICD-10-CM | POA: Diagnosis not present

## 2022-03-01 DIAGNOSIS — M25611 Stiffness of right shoulder, not elsewhere classified: Secondary | ICD-10-CM

## 2022-03-01 DIAGNOSIS — L700 Acne vulgaris: Secondary | ICD-10-CM | POA: Diagnosis not present

## 2022-03-01 MED ORDER — TRETINOIN 0.05 % EX CREA
1.0000 | TOPICAL_CREAM | Freq: Every evening | CUTANEOUS | 2 refills | Status: DC
  Filled 2022-03-01: qty 20, 30d supply, fill #0

## 2022-03-01 MED ORDER — DOXYCYCLINE HYCLATE 100 MG PO TABS
100.0000 mg | ORAL_TABLET | Freq: Every day | ORAL | 0 refills | Status: DC
  Filled 2022-03-01: qty 14, 14d supply, fill #0

## 2022-03-01 MED ORDER — DROSPIRENONE-ETHINYL ESTRADIOL 3-0.03 MG PO TABS
1.0000 | ORAL_TABLET | Freq: Every day | ORAL | 3 refills | Status: DC
  Filled 2022-03-01: qty 84, 84d supply, fill #0
  Filled 2022-05-13: qty 84, 84d supply, fill #1
  Filled 2022-08-03: qty 84, 84d supply, fill #2
  Filled 2022-10-05 – 2022-10-06 (×2): qty 84, 84d supply, fill #3

## 2022-03-01 NOTE — Therapy (Signed)
OUTPATIENT PHYSICAL THERAPY TREATMENT NOTE   Patient Name: Leslie [name] MRN: [id number] DOB:[birth year], 21 y.o., female Today's Date: 03/01/2022  PCP: Fatima Sanger, FNP REFERRING PROVIDER: Fatima Sanger, FNP   PT End of Session - 03/01/22 1320     Visit Number 3    Date for PT Re-Evaluation 04/12/22    Authorization Type UMR review after 25 visits    Authorization - Visit Number 3    Authorization - Number of Visits 25    PT Start Time 1238    PT Stop Time 1318    PT Time Calculation (min) 40 min    Activity Tolerance Patient tolerated treatment well    Behavior During Therapy WFL for tasks assessed/performed              Past Medical History:  Diagnosis Date   ADHD (attention deficit hyperactivity disorder)    Anxiety    Past Surgical History:  Procedure Laterality Date   MYRINGOTOMY WITH TUBE PLACEMENT     TONSILLECTOMY     tubes     wisdom teeth     Patient Active Problem List   Diagnosis Date Noted   Vitamin B12 deficiency 05/10/2018   B12 deficiency 02/08/2018    REFERRING DIAG: right shoulder pain  THERAPY DIAG:  Stiffness of right shoulder, not elsewhere classified  Muscle weakness (generalized)  PERTINENT HISTORY: 8 year history of shoulder pain starting when playing volleyball;  had DN in past for knee pain  PRECAUTIONS: none  SUBJECTIVE: I am having some pain in the outside of my shoulder when I touch it.  The needling is helping me.     PAIN:  Are you having pain? Yes NPRS scale: 6/10 Pain location: right anterior shoulder and post scapular  Pain orientation: Right and Anterior    Aggravating factors: night time Relieving factors: DN    OBJECTIVE:    DIAGNOSTIC FINDINGS:  X-ray "rotator cuff issue"   PATIENT SURVEYS:  FOTO 64%                                    POSTURE: WNLs   UPPER EXTREMITY AROM/PROM: Decreased right scapular mobility with shoulder elevation ; no winging with wall push up   A/PROM  Right   Left    Shoulder flexion 148 168  Shoulder extension      Shoulder abduction 155 173  Shoulder adduction      Shoulder internal rotation T10 pain T4  Shoulder external rotation C7 T1  Elbow flexion      Elbow extension      Wrist flexion      Wrist extension      Wrist ulnar deviation      Wrist radial deviation      Wrist pronation      Wrist supination      (Blank rows = not tested)   UPPER EXTREMITY MMT:   MMT Right   Left    Shoulder flexion 4 5  Shoulder extension      Shoulder abduction 4- 5  Shoulder adduction      Shoulder internal rotation 4 5  Shoulder external rotation 4 5  Middle trapezius      Lower trapezius      Elbow flexion      Elbow extension      Wrist flexion      Wrist extension  Wrist ulnar deviation      Wrist radial deviation      Wrist pronation      Wrist supination      Grip strength (lbs)      (Blank rows = not tested)   SHOULDER SPECIAL TESTS:           Impingement tests: Neer impingement test: positive                       Rotator cuff assessment: Drop arm test: positive , Empty can test: positive , External rotation lag sign: negative, and Internal rotation lag sign: negative              JOINT MOBILITY TESTING:  Mild glenohumeral and scapular hypomobility    PALPATION:  Tender points in upper traps, teres, infraspinatus and supraspinatus                 OBJECTIVE findings from initial evaluation:    DIAGNOSTIC FINDINGS:  X-ray "rotator cuff issue"   PATIENT SURVEYS:  FOTO 64%                                    POSTURE: WNLs   UPPER EXTREMITY AROM/PROM: Decreased right scapular mobility with shoulder elevation ; no winging with wall push up   A/PROM Right   Left    Shoulder flexion 148 168  Shoulder extension      Shoulder abduction 155 173  Shoulder adduction      Shoulder internal rotation T10 pain T4  Shoulder external rotation C7 T1  Elbow flexion      Elbow extension      Wrist flexion       Wrist extension      Wrist ulnar deviation      Wrist radial deviation      Wrist pronation      Wrist supination      (Blank rows = not tested)   UPPER EXTREMITY MMT:   MMT Right   Left    Shoulder flexion 4 5  Shoulder extension      Shoulder abduction 4- 5  Shoulder adduction      Shoulder internal rotation 4 5  Shoulder external rotation 4 5  Middle trapezius      Lower trapezius      Elbow flexion      Elbow extension      Wrist flexion      Wrist extension      Wrist ulnar deviation      Wrist radial deviation      Wrist pronation      Wrist supination      Grip strength (lbs)      (Blank rows = not tested)   SHOULDER SPECIAL TESTS:           Impingement tests: Neer impingement test: positive                       Rotator cuff assessment: Drop arm test: positive , Empty can test: positive , External rotation lag sign: negative, and Internal rotation lag sign: negative              JOINT MOBILITY TESTING:  Mild glenohumeral and scapular hypomobility    PALPATION:  Tender points in upper traps, teres, infraspinatus and supraspinatus  TODAY'S TREATMENT  03/01/2022 prone rows, extensions, horizontal abduction 5 reps- minor tactile cues required to reduce scapular elevation and to increased posterior deltoid activation  Counter top push ups 8x Counter top shoulder taps 10x Pec stretch in doorway: 10x 5" hold  Yellow band tied in loop wall slides up and lift offs at the top 5x:  Manual therapy: Skilled palpation and monitoring during DN, Soft tissue mobilization to right pectorals, teres, rhomboid, levator, upper traps, infraspinatus, supraspinatus    Trigger Point Dry-Needling  Treatment instructions: Expect mild to moderate muscle soreness. S/S of pneumothorax if dry needled over a lung field, and to seek immediate medical attention should they occur. Patient verbalized understanding of these instructions and education.  Patient Consent Given:  Yes Education handout provided: Previously provided Muscles treated: DN of right pectorals, levator scap, rhomboid, upper trap, teres, infraspinatus, supraspinatus muscles.  Multiple twitch response produced and improved soft tissue length noted.  Much improved flexion and internal rotation ROM after DN/manual therapy.   Electrical stimulation performed: No Parameters: N/A Treatment response/outcome: Twitch response elicited and Palpable decrease in muscle tension   02/24/2022 Review of initial HEP prone rows, extensions, horizontal abduction 3-5 reps Counter top push ups 10x Counter top shoulder taps 10x Small ball circles on wall 10x each way Yellow band tied in loop wall slides up and lift offs at the top 5x:  Manual therapy/Soft tissue mobilization to right pectorals, teres, rhomboid, levator, upper traps, infraspinatus, supraspinatus    Trigger Point Dry-Needling  Treatment instructions: Expect mild to moderate muscle soreness. S/S of pneumothorax if dry needled over a lung field, and to seek immediate medical attention should they occur. Patient verbalized understanding of these instructions and education.  Patient Consent Given: Yes Education handout provided: Previously provided Muscles treated: DN of right pectorals, levator scap, rhomboid, upper trap, teres, infraspinatus, supraspinatus muscles.  Multiple twitch response produced and improved soft tissue length noted.  Much improved flexion and internal rotation ROM after DN/manual therapy.   Electrical stimulation performed: No Parameters: N/A Treatment response/outcome: Twitch response elicited and Palpable decrease in muscle tension   TODAY'S TREATMENT from initial evaluation:  The patient has had DN in the past for knee pain and had an excellent response.  She is open to manual therapy/soft tissue mobilization and DN of right upper trap, teres, infraspinatus, supraspinatus muscles.  Multiple twitch response produced and improved  soft tissue length noted.  Much improved flexion and internal rotation ROM after DN/manual therapy.   PATIENT EDUCATION: Education details: prone row, extensions, horizontal abduction; DN after care Person educated: Patient Education method: Explanation, Demonstration, and Handouts Education comprehension: verbalized understanding and returned demonstration     HOME EXERCISE PROGRAM:  [facility] Code: [email or web address] URL: [email or web address] Date: 02/24/2022 Prepared by: Lavinia SharpsStacy Simpson  Exercises Prone Shoulder Extension - Single Arm - 1 x daily - 7 x weekly - 1 sets - 10 reps Prone Shoulder Row - 1 x daily - 7 x weekly - 1 sets - 10 reps Prone Single Arm Shoulder Horizontal Abduction with Scapular Retraction and Palm Down - 1 x daily - 7 x weekly - 1 sets - 10 reps Push ups on kitchen counter - 1 x daily - 7 x weekly - 1 sets - 10 reps Shoulder Taps on Table - 1 x daily - 7 x weekly - 1 sets - 10 reps Standing Wall Ball Circles with Mini Swiss Ball - 1 x daily - 7 x weekly - 2 sets - 10 reps Shoulder  Flexion Wall Slide with Resistance Band - 1 x daily - 7 x weekly - 1 sets - 10 reps  ASSESSMENT:   CLINICAL IMPRESSION: Pt reports that she experiences fatigue of her Rt shoulder scapula at rest now that she is doing HEP for strength.  Pt reports improved tissue mobility in the posterior Rt shoulder and denies any change in pain in the Rt pecs.  Pt is challenged with current level of exercise and can only perform 5-8 reps of most exercises. Pt with good response to DN including multiple twitch responses and improved tissue mobility after DN today.  Pt will continue to benefit from skilled PT to address chronic Rt shoulder pain.     OBJECTIVE IMPAIRMENTS decreased ROM, decreased strength, increased fascial restrictions, impaired UE functional use, and pain.    ACTIVITY LIMITATIONS cleaning, community activity, driving, and occupation.    PERSONAL FACTORS Time since onset of  injury/illness/exacerbation are also affecting patient's functional outcome.      REHAB POTENTIAL: Good   CLINICAL DECISION MAKING: Stable/uncomplicated   EVALUATION COMPLEXITY: Low     GOALS: Goals reviewed with patient? 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 Baseline:  03/15/2022 INITIAL  3 Right shoulder flexion to 158, abduction to 163 needed for home and work ADLS Baseline: 03/15/2022 INITIAL  4   Baseline:      5   Baseline:      6     Baseline:      7     Baseline:        LONG TERM GOALS:    LTG Name Target Date Goal status  1 The patient will be independent in safe, self progression of HEP Baseline: 04/12/2022 INITIAL  2 The patient will report a 75% improvement in right shoulder pain with home and work ADLs Baseline: 04/12/2022 INITIAL  3 The patient will be able to perform overhead activity for 30 sec with pain level 3/10 Baseline: 04/12/2022 INITIAL  4 The patient will have grossly 4+/5 shoulder and scapular muscle strength to decrease rotator cuff overuse Baseline: 04/12/2022 INITIAL  5 Shoulder flexion 168, abduction 170, internal rotation to T8 needed for fixing her hair Baseline: 04/12/2022 INITIAL  6 FOTO score improved to 75% Baseline: 04/12/2022 INITIAL  7   Baseline: 04/12/2022 INITIAL    PLAN: PT FREQUENCY: 2x/week   PT DURATION: 8 weeks   PLANNED INTERVENTIONS: Therapeutic exercises, Therapeutic activity, Neuro Muscular re-education, Balance training, Gait training, Patient/Family education, Joint mobilization, Aquatic Therapy, Dry Needling, Electrical stimulation, Cryotherapy, Moist heat, Taping, Ultrasound, Ionotophoresis 4mg /ml Dexamethasone, and Manual therapy   PLAN FOR NEXT  SESSION: assess response to DN#3;  shoulder stabilization ex's; middle and lower trap strengthening   URL: [email or web address] Date: 02/15/2022 Prepared by: 02/17/2022   Exercises Prone Shoulder Extension - Single Arm - 1 x daily - 7 x weekly - 1 sets - 10 reps Prone Shoulder Row - 1 x daily - 7 x weekly - 1 sets - 10 reps Prone Single Arm Shoulder Horizontal Abduction with Scapular Retraction and Palm Down - 1 x daily - 7 x weekly - 1 sets - 10 reps         Quintara Bost, PT 03/01/2022,  1:21 PM

## 2022-03-03 ENCOUNTER — Ambulatory Visit: Payer: 59

## 2022-03-03 ENCOUNTER — Other Ambulatory Visit: Payer: Self-pay

## 2022-03-03 DIAGNOSIS — M25611 Stiffness of right shoulder, not elsewhere classified: Secondary | ICD-10-CM | POA: Diagnosis not present

## 2022-03-03 DIAGNOSIS — M6281 Muscle weakness (generalized): Secondary | ICD-10-CM

## 2022-03-03 NOTE — Therapy (Addendum)
OUTPATIENT PHYSICAL THERAPY TREATMENT NOTE   Patient Name: Leslie [name] MRN: [id number] DOB:August 05, 2001, 21 y.o., female Today's Date: 03/03/2022  PCP: Holland Commons, FNP REFERRING PROVIDER: Holland Commons, FNP   PT End of Session - 03/03/22 [phone]     Visit Number 4    Date for PT Re-Evaluation 04/12/22    Authorization Type UMR review after 25 visits    Authorization - Visit Number 4    Authorization - Number of Visits 25    PT Start Time 0801    PT Stop Time 0835    PT Time Calculation (min) 34 min    Activity Tolerance Patient tolerated treatment well    Behavior During Therapy [facility] for tasks assessed/performed               Past Medical History:  Diagnosis Date   ADHD (attention deficit hyperactivity disorder)    Anxiety    Past Surgical History:  Procedure Laterality Date   MYRINGOTOMY WITH TUBE PLACEMENT     TONSILLECTOMY     tubes     wisdom teeth     Patient Active Problem List   Diagnosis Date Noted   Vitamin B12 deficiency 05/10/2018   B12 deficiency 02/08/2018    REFERRING DIAG: right shoulder pain  THERAPY DIAG:  Stiffness of right shoulder, not elsewhere classified  Muscle weakness (generalized)  PERTINENT HISTORY: 8 year history of shoulder pain starting when playing volleyball;  had DN in past for knee pain  PRECAUTIONS: none  SUBJECTIVE: I feel about the same.  I feel like my arm is going to "pop out" when I reach forward.      PAIN:  Are you having pain? Yes NPRS scale: 6/10 Pain location: right anterior shoulder and post scapular  Pain orientation: Right and Anterior    Aggravating factors: night time Relieving factors: DN OBJECTIVE:      OBJECTIVE findings from initial evaluation:    DIAGNOSTIC FINDINGS:  X-ray "rotator cuff issue"   PATIENT SURVEYS:  FOTO 64%                        POSTURE: WNLs   UPPER EXTREMITY AROM/PROM: Decreased right scapular mobility with shoulder elevation ; no winging with  wall push up   A/PROM Right   Left    Shoulder flexion 148 168  Shoulder extension      Shoulder abduction 155 173  Shoulder adduction      Shoulder internal rotation T10 pain T4  Shoulder external rotation C7 T1  Elbow flexion      Elbow extension      Wrist flexion      Wrist extension      Wrist ulnar deviation      Wrist radial deviation      Wrist pronation      Wrist supination      (Blank rows = not tested)   UPPER EXTREMITY MMT:   MMT Right   Left    Shoulder flexion 4 5  Shoulder extension      Shoulder abduction 4- 5  Shoulder adduction      Shoulder internal rotation 4 5  Shoulder external rotation 4 5  Middle trapezius      Lower trapezius      Elbow flexion      Elbow extension      Wrist flexion      Wrist extension      Wrist ulnar deviation  Wrist radial deviation      Wrist pronation      Wrist supination      Grip strength (lbs)      (Blank rows = not tested)   SHOULDER SPECIAL TESTS:           Impingement tests: Neer impingement test: positive                       Rotator cuff assessment: Drop arm test: positive , Empty can test: positive , External rotation lag sign: negative, and Internal rotation lag sign: negative              JOINT MOBILITY TESTING:  Mild glenohumeral and scapular hypomobility    PALPATION:  Tender points in upper traps, teres, infraspinatus and supraspinatus             TODAY'S TREATMENT  03/03/2022 Standing rows and shoulder extension: red band 2x10 Supine on foam roll: static pec stretch x 3 min, ER with band red 2x10, horizontal abduction without band 2x10 Pec stretch in doorway: 10x 5" hold  Yellow band tied in loop wall slides up and lift offs at the top 5x:  Open book stretch with foam roll anchor x10 each   03/01/2022 prone rows, extensions, horizontal abduction 5 reps- minor tactile cues required to reduce scapular elevation and to increased posterior deltoid activation  Counter top push ups  8x Counter top shoulder taps 10x Pec stretch in doorway: 10x 5" hold  Yellow band tied in loop wall slides up and lift offs at the top 5x:  Manual therapy: Skilled palpation and monitoring during DN, Soft tissue mobilization to right pectorals, teres, rhomboid, levator, upper traps, infraspinatus, supraspinatus    Trigger Point Dry-Needling  Treatment instructions: Expect mild to moderate muscle soreness. S/S of pneumothorax if dry needled over a lung field, and to seek immediate medical attention should they occur. Patient verbalized understanding of these instructions and education.  Patient Consent Given: Yes Education handout provided: Previously provided Muscles treated: DN of right pectorals, levator scap, rhomboid, upper trap, teres, infraspinatus, supraspinatus muscles.  Multiple twitch response produced and improved soft tissue length noted.  Much improved flexion and internal rotation ROM after DN/manual therapy.   Electrical stimulation performed: No Parameters: N/A Treatment response/outcome: Twitch response elicited and Palpable decrease in muscle tension   02/24/2022 Review of initial HEP prone rows, extensions, horizontal abduction 3-5 reps Counter top push ups 10x Counter top shoulder taps 10x Small ball circles on wall 10x each way Yellow band tied in loop wall slides up and lift offs at the top 5x:  Manual therapy/Soft tissue mobilization to right pectorals, teres, rhomboid, levator, upper traps, infraspinatus, supraspinatus    Trigger Point Dry-Needling  Treatment instructions: Expect mild to moderate muscle soreness. S/S of pneumothorax if dry needled over a lung field, and to seek immediate medical attention should they occur. Patient verbalized understanding of these instructions and education.  Patient Consent Given: Yes Education handout provided: Previously provided Muscles treated: DN of right pectorals, levator scap, rhomboid, upper trap, teres, infraspinatus,  supraspinatus muscles.  Multiple twitch response produced and improved soft tissue length noted.  Much improved flexion and internal rotation ROM after DN/manual therapy.   Electrical stimulation performed: No Parameters: N/A Treatment response/outcome: Twitch response elicited and Palpable decrease in muscle tension     HOME EXERCISE PROGRAM:   PATIENT EDUCATION: Education details: [place] Person educated: Patient Education method: Explanation, Demonstration, and Handouts Education comprehension:  verbalized understanding and returned demonstration   HOME EXERCISE PROGRAM: Access Code: [facility] URL: [email or web address] Date: 03/03/2022 Prepared by: Claiborne Billings  Exercises Scapular Retraction with Resistance - 2 x daily - 7 x weekly - 10 reps - 2 sets Shoulder extension with resistance - Neutral - 2 x daily - 7 x weekly - 10 reps - 2 sets Doorway Pec Stretch at 90 Degrees Abduction - 2 x daily - 7 x weekly - 1 sets - 5 reps - 10 hold Chest Stretch on Foam 1/2 Roll Arms Extended - 1 x daily - 7 x weekly - 1 sets - 10 reps   ASSESSMENT:   CLINICAL IMPRESSION: Pt had good response to DN last session and reports improved flexibility and reduced muscle tension.  PT issued HEP for scapular strength in standing and pec stretch to address posture and anterior pec stiffness.  Pt did well with all exercises and fatigued quickly.  PT provided verbal and tactile cueing for alignment and technique with exercise.  Pt with chronic shoulder pain and functional strength deficits and will continue to benefit from skilled PT to address.     OBJECTIVE IMPAIRMENTS decreased ROM, decreased strength, increased fascial restrictions, impaired UE functional use, and pain.    ACTIVITY LIMITATIONS cleaning, community activity, driving, and occupation.    PERSONAL FACTORS Time since onset of injury/illness/exacerbation are also affecting patient's functional outcome.      REHAB POTENTIAL: Good    CLINICAL DECISION MAKING: Stable/uncomplicated   EVALUATION COMPLEXITY: Low     GOALS: Goals reviewed with patient? 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 Baseline:  03/15/2022 INITIAL  3 Right shoulder flexion to 158, abduction to 163 needed for home and work ADLS Baseline: 06/18/7627 INITIAL  4   Baseline:      5   Baseline:      6     Baseline:      7     Baseline:        LONG TERM GOALS:    LTG Name Target Date Goal status  1 The patient will be independent in safe, self progression of HEP Baseline: 04/12/2022 INITIAL  2 The patient will report a 75% improvement in right shoulder pain with home and work ADLs Baseline: 04/12/2022 INITIAL  3 The patient will be able to perform overhead activity for 30 sec with pain level 3/10 Baseline: 04/12/2022 INITIAL  4 The patient will have grossly 4+/5 shoulder and scapular muscle strength to decrease rotator cuff overuse Baseline: 04/12/2022 INITIAL  5 Shoulder flexion 168, abduction 170, internal rotation to T8 needed for fixing her hair Baseline: 04/12/2022 INITIAL  6 FOTO score improved to 75% Baseline: 04/12/2022 INITIAL  7   Baseline: 04/12/2022 INITIAL    PLAN: PT FREQUENCY: 2x/week   PT DURATION: 8 weeks   PLANNED INTERVENTIONS: Therapeutic exercises, Therapeutic activity, Neuro Muscular re-education, Balance training, Gait training, Patient/Family education, Joint mobilization, Aquatic Therapy, Dry Needling, Electrical stimulation, Cryotherapy, Moist heat, Taping, Ultrasound, Ionotophoresis 40m/ml Dexamethasone, and Manual therapy   PLAN FOR NEXT SESSION: DN #4,  shoulder stabilization ex's; middle and lower trap strengthening   URL:  [email or web address] Date: 02/15/2022 Prepared by: SRuben Im  Exercises Prone Shoulder Extension -  Single Arm - 1 x daily - 7 x weekly - 1 sets - 10 reps Prone Shoulder Row - 1 x daily - 7 x weekly - 1 sets - 10 reps Prone Single Arm Shoulder Horizontal Abduction with Scapular Retraction and Palm Down - 1 x daily - 7 x weekly - 1 sets - 10 reps         Cuca Benassi, PT 03/03/2022, 8:42 AM   PHYSICAL THERAPY DISCHARGE SUMMARY  Visits from Start of Care: 4  Current functional level related to goals / functional outcomes: Pt didn't return after this session.  See above for most current status.     Remaining deficits: See above.    Education / Equipment: HEP, posture    Patient agrees to discharge. Patient goals were partially met. Patient is being discharged due to not returning since the last visit.  Sigurd Sos, PT 05/17/22 6:30 PM

## 2022-03-07 ENCOUNTER — Other Ambulatory Visit (HOSPITAL_COMMUNITY): Payer: Self-pay

## 2022-03-07 DIAGNOSIS — S46001A Unspecified injury of muscle(s) and tendon(s) of the rotator cuff of right shoulder, initial encounter: Secondary | ICD-10-CM | POA: Diagnosis not present

## 2022-03-07 DIAGNOSIS — E559 Vitamin D deficiency, unspecified: Secondary | ICD-10-CM | POA: Diagnosis not present

## 2022-03-07 DIAGNOSIS — Z0000 Encounter for general adult medical examination without abnormal findings: Secondary | ICD-10-CM | POA: Diagnosis not present

## 2022-03-07 DIAGNOSIS — M25511 Pain in right shoulder: Secondary | ICD-10-CM | POA: Diagnosis not present

## 2022-03-07 DIAGNOSIS — E538 Deficiency of other specified B group vitamins: Secondary | ICD-10-CM | POA: Diagnosis not present

## 2022-03-07 MED ORDER — METHOCARBAMOL 500 MG PO TABS
500.0000 mg | ORAL_TABLET | ORAL | 0 refills | Status: DC
  Filled 2022-03-07: qty 40, 10d supply, fill #0

## 2022-03-08 ENCOUNTER — Other Ambulatory Visit (HOSPITAL_BASED_OUTPATIENT_CLINIC_OR_DEPARTMENT_OTHER): Payer: Self-pay | Admitting: Physician Assistant

## 2022-03-08 ENCOUNTER — Telehealth: Payer: Self-pay | Admitting: Physical Therapy

## 2022-03-08 ENCOUNTER — Ambulatory Visit: Payer: 59 | Admitting: Physical Therapy

## 2022-03-08 DIAGNOSIS — S46001A Unspecified injury of muscle(s) and tendon(s) of the rotator cuff of right shoulder, initial encounter: Secondary | ICD-10-CM

## 2022-03-08 NOTE — Telephone Encounter (Signed)
Called and left voicemail message regarding missed appt today.  Reminded of next appt 3/16 ?

## 2022-03-11 ENCOUNTER — Ambulatory Visit (HOSPITAL_BASED_OUTPATIENT_CLINIC_OR_DEPARTMENT_OTHER)
Admission: RE | Admit: 2022-03-11 | Discharge: 2022-03-11 | Disposition: A | Discharge status: Home / Self Care | Payer: 59 | Source: Ambulatory Visit | Attending: Physician Assistant | Admitting: Physician Assistant

## 2022-03-11 ENCOUNTER — Other Ambulatory Visit: Payer: Self-pay

## 2022-03-11 DIAGNOSIS — X58XXXA Exposure to other specified factors, initial encounter: Secondary | ICD-10-CM | POA: Diagnosis not present

## 2022-03-11 DIAGNOSIS — S46001A Unspecified injury of muscle(s) and tendon(s) of the rotator cuff of right shoulder, initial encounter: Secondary | ICD-10-CM | POA: Diagnosis not present

## 2022-03-11 DIAGNOSIS — Y9368 Activity, volleyball (beach) (court): Secondary | ICD-10-CM | POA: Insufficient documentation

## 2022-03-11 DIAGNOSIS — S43431A Superior glenoid labrum lesion of right shoulder, initial encounter: Secondary | ICD-10-CM | POA: Diagnosis not present

## 2022-03-11 IMAGING — MR MR SHOULDER*R* W/O CM
5 series · 40 of 40 positions shown · non-contrast
Comparison: None.

CLINICAL DATA: Right anterior/posterior shoulder pain related to a
volleyball injury 8 years ago, and notes right arm goes numb
sometimes, and reports hard to lift arm above head. History of torn
rotator cuff with only 10% left.

EXAM:
MRI OF THE RIGHT SHOULDER WITHOUT CONTRAST
TECHNIQUE: Multiplanar, multisequence MR imaging of the shoulder was performed.
No intravenous contrast was administered.

[Series 5: T2 fat-sat · oblique · 4.0mm · 0.59mm/px · 7 of 25 slices shown (1 of 3)]
[im 1/25]
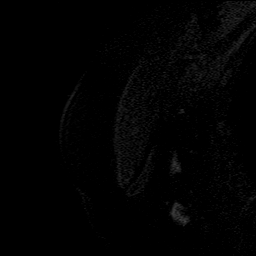
[im 5/25]
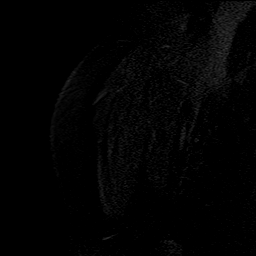
[im 9/25]
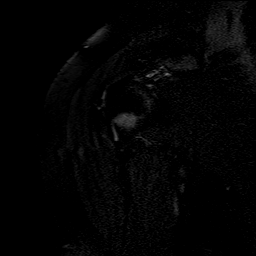
[im 13/25]
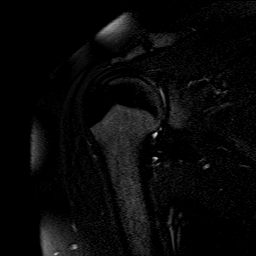
[im 17/25]
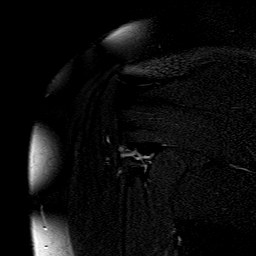
[im 21/25]
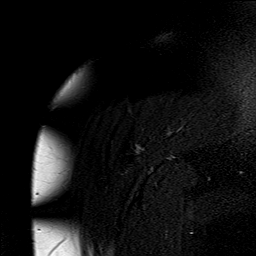
[im 25/25]
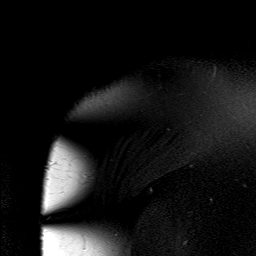

[Series 6: PD · oblique · 4.0mm · 0.59mm/px · 8 of 25 slices shown]
[im 1/25]
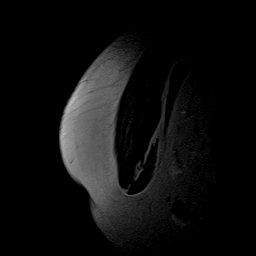
[im 4/25]
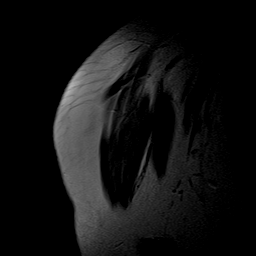
[im 7/25]
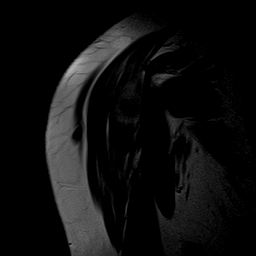
[im 11/25]
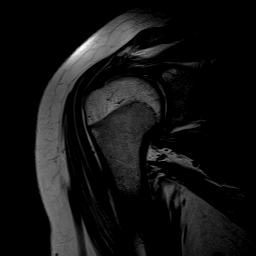
[im 14/25]
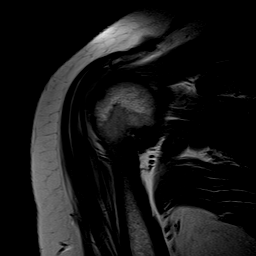
[im 18/25]
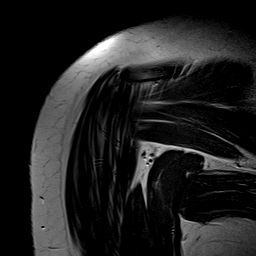
[im 21/25]
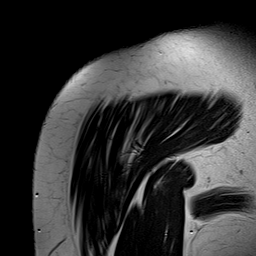
[im 25/25]
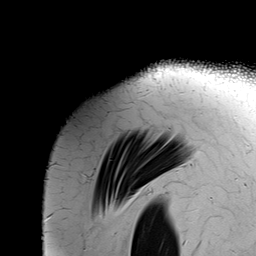

[Series 7: T1 · oblique · 4.0mm · 0.59mm/px · 8 of 25 slices shown]
[im 1/25]
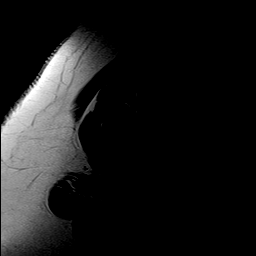
[im 4/25]
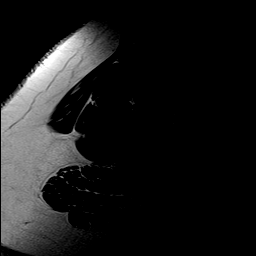
[im 7/25]
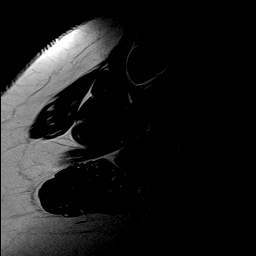
[im 11/25]
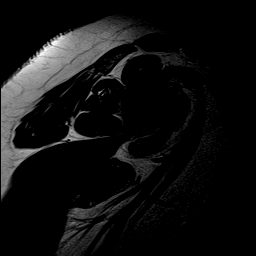
[im 14/25]
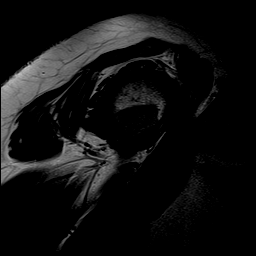
[im 18/25]
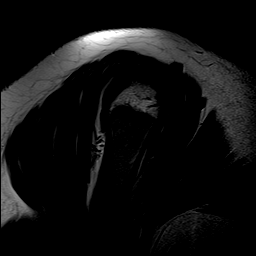
[im 21/25]
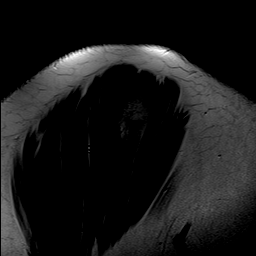
[im 25/25]
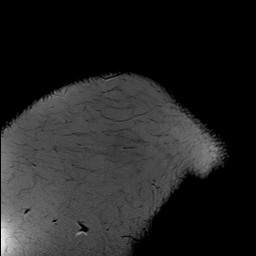

[Series 8: T2 fat-sat · oblique · 4.0mm · 0.59mm/px · 8 of 25 slices shown (2 of 3)]
[im 1/25]
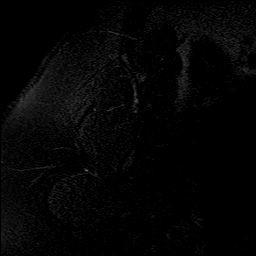
[im 4/25]
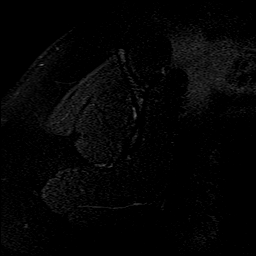
[im 7/25]
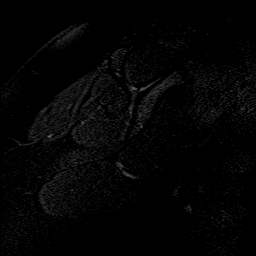
[im 11/25]
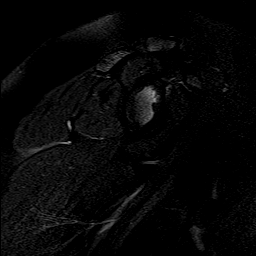
[im 14/25]
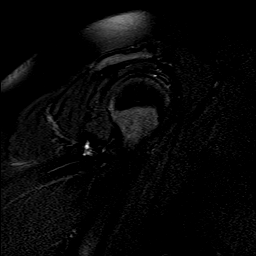
[im 18/25]
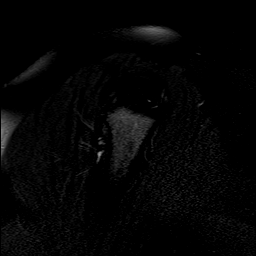
[im 21/25]
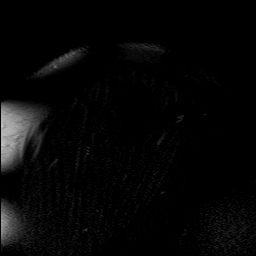
[im 25/25]
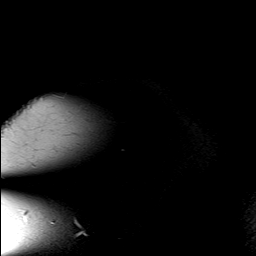

[Series 9: T2 fat-sat · axial · 4.0mm · 0.59mm/px · z∈[-23,+103]mm · 9 of 29 slices shown (3 of 3)]
[im 1/29]
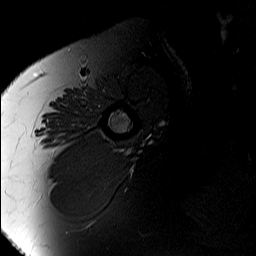
[im 4/29]
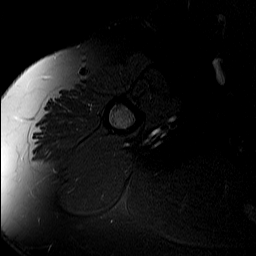
[im 8/29]
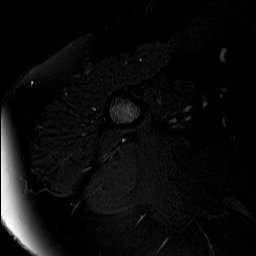
[im 11/29]
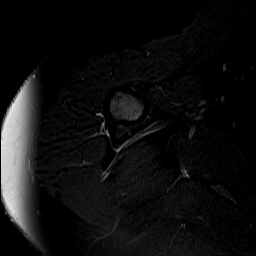
[im 15/29]
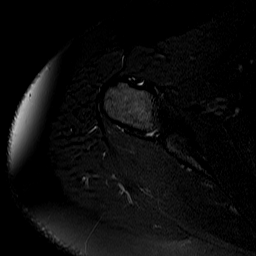
[im 18/29]
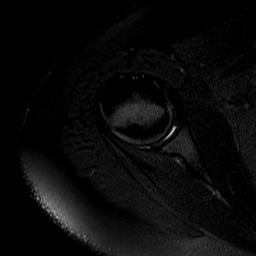
[im 22/29]
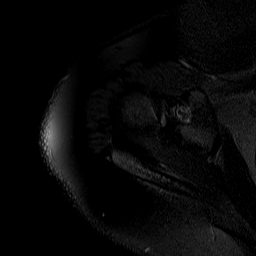
[im 25/29]
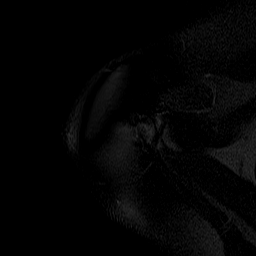
[im 29/29]
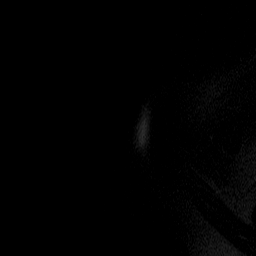

[40 of 40 positions shown; findings below may reference images not displayed]

FINDINGS: Rotator cuff: The supraspinatus, infraspinatus, subscapularis, and
teres minor are intact.

Muscles: No rotator cuff muscle atrophy, fatty infiltration, or
edema.

Biceps long head: The intra-articular long head of the biceps tendon
is intact.

Acromioclavicular Joint: Normal alignment of the acromioclavicular
joint without significant degenerative change. Type 2 acromion. No
subacromial/subdeltoid bursitis.

Glenohumeral Joint: Intact cartilage.

Labrum: There appears to be mild attenuation of the posterior
glenoid labrum however this may represent the patient's normal
anatomy. No fluid bright tear is visualized. Lack of intra-articular
fluid does limit evaluation of the labrum.

Bones:  No acute fracture.

Other: None.
IMPRESSION: :
IMPRESSION: 1. Intact rotator cuff.
2. No significant degenerative change of the acromioclavicular
joint.
3. Apparent mild attenuation of the posterior glenoid labrum,
however this may represent the patient's normal anatomy.

## 2022-03-14 ENCOUNTER — Ambulatory Visit: Payer: 59 | Admitting: Orthopedic Surgery

## 2022-03-15 ENCOUNTER — Other Ambulatory Visit (HOSPITAL_COMMUNITY): Payer: Self-pay

## 2022-03-15 ENCOUNTER — Ambulatory Visit
Admission: EM | Admit: 2022-03-15 | Discharge: 2022-03-15 | Disposition: A | Discharge status: Home / Self Care | Payer: 59 | Attending: Emergency Medicine | Admitting: Emergency Medicine

## 2022-03-15 ENCOUNTER — Other Ambulatory Visit: Payer: Self-pay

## 2022-03-15 ENCOUNTER — Encounter: Payer: 59 | Admitting: Physical Therapy

## 2022-03-15 DIAGNOSIS — K209 Esophagitis, unspecified without bleeding: Secondary | ICD-10-CM

## 2022-03-15 DIAGNOSIS — J029 Acute pharyngitis, unspecified: Secondary | ICD-10-CM

## 2022-03-15 LAB — POCT URINALYSIS DIP (MANUAL ENTRY)
Bilirubin, UA: NEGATIVE
Blood, UA: NEGATIVE
Glucose, UA: NEGATIVE mg/dL
Ketones, POC UA: NEGATIVE mg/dL
Leukocytes, UA: NEGATIVE
Nitrite, UA: NEGATIVE
Protein Ur, POC: NEGATIVE mg/dL
Spec Grav, UA: 1.025 (ref 1.010–1.025)
Urobilinogen, UA: 0.2 E.U./dL
pH, UA: 6 (ref 5.0–8.0)

## 2022-03-15 LAB — POCT RAPID STREP A (OFFICE): Rapid Strep A Screen: NEGATIVE

## 2022-03-15 LAB — POCT URINE PREGNANCY: Preg Test, Ur: NEGATIVE

## 2022-03-15 MED ORDER — SUCRALFATE 1 G PO TABS
1.0000 g | ORAL_TABLET | Freq: Three times a day (TID) | ORAL | 0 refills | Status: DC
  Filled 2022-03-15: qty 28, 7d supply, fill #0

## 2022-03-15 NOTE — ED Triage Notes (Signed)
Pt c/o center chest pain radiating up to throat since awaken this am. Denies other sx's. Caregiver states pt has had high fevers and vomiting all last week until Sunday 2 days ago. ?

## 2022-03-15 NOTE — Discharge Instructions (Addendum)
Your rapid strep test today is negative.  Per our protocol, throat culture will be performed as well, will be notified of the result once it is complete and if there is a positive strep result, you will be provided with antibiotics. ? ?Your urinalysis today was concerning for mild dehydration but was otherwise normal. ? ?Your EKG today demonstrated normal sinus rhythm. ? ?I believe that the upper chest and throat pain that you are having at this time is due to irritation in your esophagus from prolonged vomiting. ? ?I recommend that you begin taking Carafate.  Please take 1 tablet 3 times daily with meals and again before bedtime.  Please do this for the next 7 days. ? ?If you have not had complete relief of your symptoms, please follow-up with your primary care provider.  If you have worsening pain in your chest, please report to the emergency room for further evaluation. ? ?Thank you for visiting urgent care today. ?

## 2022-03-15 NOTE — ED Provider Notes (Signed)
?[facility] ? ? ? ?CSN: [id number] ?Arrival date & time: 03/15/22  1427 ?  ? ?HISTORY  ? ?Chief Complaint  ?Patient presents with  ? Chest Pain  ? ?HPI ?Leslie [name] is a 21 y.o. female. Pt c/o center chest pain radiating up to throat since awaken this am, denies cough, nasal congestion, sinus drainage, ear pain, body aches, loss of taste or smell.  Patient is here with her mother today who states has had high fever (Tmax 102.5) as well as vomiting that began 9 days ago and resolved 2 days ago, caregiver states she was retching and vomiting at least 4 times a day.  Caregiver states she also was concerned because patient appears flushed at this time.  As I walk into the room, patient is pleasant, smiling, interactive and well-appearing.  Vital signs are normal today.  Urine pregnancy test today is negative, urinalysis is unremarkable and EKG, requested by caregiver, was normal.  Patient denies abdominal pain, constipation but reports significantly decreased appetite but is not fully resolved since her recent gastroenteritis. ? ?The history is provided by the patient and a caregiver.  ?Past Medical History:  ?Diagnosis Date  ? ADHD (attention deficit hyperactivity disorder)   ? Anxiety   ? ?Patient Active Problem List  ? Diagnosis Date Noted  ? Vitamin B12 deficiency 05/10/2018  ? B12 deficiency 02/08/2018  ? ?Past Surgical History:  ?Procedure Laterality Date  ? MYRINGOTOMY WITH TUBE PLACEMENT    ? TONSILLECTOMY    ? tubes    ? wisdom teeth    ? ?OB History   ? ? Gravida  ?0  ? Para  ?0  ? Term  ?0  ? Preterm  ?0  ? AB  ?0  ? Living  ?0  ?  ? ? SAB  ?0  ? IAB  ?0  ? Ectopic  ?0  ? Multiple  ?0  ? Live Births  ?0  ?   ?  ?  ? ?Home Medications   ? ?Prior to Admission medications   ?Medication Sig Start Date End Date Taking? Authorizing Provider  ?Cholecalciferol (VITAMIN D3) 2000 units TABS Take 2,000 Units by mouth daily.    [provider]  ?doxycycline (VIBRA-TABS) 100 MG tablet Take 1  tablet by mouth once a day 03/01/22     ?drospirenone-ethinyl estradiol (YASMIN) 3-0.03 MG tablet Take 1 tablet by mouth once a day 03/01/22     ?lisdexamfetamine (VYVANSE) 30 MG capsule Take 1 capsule (30 mg total) by mouth in the morning. 12/06/21     ?methocarbamol (ROBAXIN) 500 MG tablet Take 1 tablet (500 mg total) by mouth every 6-8 hours as needed for spasm 03/07/22     ?sertraline (ZOLOFT) 50 MG tablet Take 1 tablet (50 mg total) by mouth daily. 01/03/22     ?tretinoin (RETIN-A) 0.05 % cream Apply 1 application (a small amount) topically to skin every night 03/01/22     ?vitamin B-12 (CYANOCOBALAMIN) 1000 MCG tablet Take 1,000 mcg by mouth daily.    [provider]  ? ?Family History ?Family History  ?Adopted: Yes  ? ?Social History ?Social History  ? ?Tobacco Use  ? Smoking status: Never  ? Smokeless tobacco: Never  ?Vaping Use  ? Vaping Use: Never used  ?Substance Use Topics  ? Alcohol use: Yes  ?  Comment: 3 or 4 x/year  ? Drug use: No  ? ?Allergies   ?Amoxicillin-pot clavulanate ? ?Review of Systems ?Review of Systems ?Pertinent  findings noted in history of present illness.  ? ?Physical Exam ?Triage Vital Signs ?ED Triage Vitals  ?Enc Vitals Group  ?   BP 10/22/21 0827 (!) 147/82  ?   Pulse Rate 10/22/21 0827 72  ?   Resp 10/22/21 0827 18  ?   Temp 10/22/21 0827 98.3 ?F (36.8 ?C)  ?   Temp Source 10/22/21 0827 Oral  ?   SpO2 10/22/21 0827 98 %  ?   Weight --   ?   Height --   ?   Head Circumference --   ?   Peak Flow --   ?   Pain Score 10/22/21 0826 5  ?   Pain Loc --   ?   Pain Edu? --   ?   Excl. in GC? --   ?No data found. ? ?Updated Vital Signs ?BP 118/80 (BP Location: Left Arm)   Pulse 84   Temp 98.8 ?F (37.1 ?C) (Oral)   Resp 18   LMP 03/01/2022   SpO2 97%  ? ?Physical Exam ?Vitals and nursing note reviewed.  ?Constitutional:   ?   General: She is not in acute distress. ?   Appearance: Normal appearance. She is not ill-appearing.  ?HENT:  ?   Head: Normocephalic and atraumatic.  ?Eyes:  ?    General: Lids are normal.     ?   Right eye: No discharge.     ?   Left eye: No discharge.  ?   Extraocular Movements: Extraocular movements intact.  ?   Conjunctiva/sclera: Conjunctivae normal.  ?   Right eye: Right conjunctiva is not injected.  ?   Left eye: Left conjunctiva is not injected.  ?Neck:  ?   Trachea: Trachea and phonation normal.  ?Cardiovascular:  ?   Rate and Rhythm: Normal rate and regular rhythm.  ?   Pulses: Normal pulses.  ?   Heart sounds: Normal heart sounds. No murmur heard. ?  No friction rub. No gallop.  ?Pulmonary:  ?   Effort: Pulmonary effort is normal. No accessory muscle usage, prolonged expiration or respiratory distress.  ?   Breath sounds: Normal breath sounds. No stridor, decreased air movement or transmitted upper airway sounds. No decreased breath sounds, wheezing, rhonchi or rales.  ?Chest:  ?   Chest wall: No tenderness.  ?Abdominal:  ?   General: Abdomen is flat. Bowel sounds are normal. There is no distension or abdominal bruit.  ?   Palpations: Abdomen is soft. There is no fluid wave, hepatomegaly, splenomegaly or mass.  ?   Tenderness: There is no abdominal tenderness. There is no right CVA tenderness, left CVA tenderness, guarding or rebound.  ?   Hernia: No hernia is present.  ?Musculoskeletal:     ?   General: Normal range of motion.  ?   Cervical back: Normal range of motion and neck supple. Normal range of motion.  ?Lymphadenopathy:  ?   Cervical: No cervical adenopathy.  ?Skin: ?   General: Skin is warm and dry.  ?   Findings: No erythema or rash.  ?Neurological:  ?   General: No focal deficit present.  ?   Mental Status: She is alert and oriented to person, place, and time.  ?Psychiatric:     ?   Mood and Affect: Mood normal.     ?   Behavior: Behavior normal.  ? ? ?Visual Acuity ?Right Eye Distance:   ?Left Eye Distance:   ?Bilateral Distance:   ? ?  Right Eye Near:   ?Left Eye Near:    ?Bilateral Near:    ? ?UC Couse / Diagnostics / Procedures:  ?   ?EKG ? ?Radiology ?No results found. ? ?Procedures ?Procedures (including critical care time) ? ?UC Diagnoses / Final Clinical Impressions(s)   ?I have reviewed the triage vital signs and the nursing notes. ? ?Pertinent labs & imaging results that were available during my care of the patient were reviewed by me and considered in my medical decision making (see chart for details).   ?Final diagnoses:  ?Pharyngitis, unspecified etiology  ?Acute esophagitis  ? ?All testing today was normal.  Throat culture is pending.  Patient advised to begin Carafate for esophagitis secondary to prolonged vomiting.  Return precautions advised. ? ?ED Prescriptions   ? ? Medication Sig Dispense Auth. Provider  ? sucralfate (CARAFATE) 1 g tablet Take 1 tablet (1 g total) by mouth 4 (four) times daily -  with meals and at bedtime for 7 days. 28 tablet Theadora Rama Scales, PA-C  ? ?  ? ?PDMP not reviewed this encounter. ? ?Pending results:  ?Labs Reviewed  ?CULTURE, GROUP A STREP [facility])  ?POCT RAPID STREP A (OFFICE)  ?POCT URINALYSIS DIP (MANUAL ENTRY)  ?POCT URINE PREGNANCY  ? ? ?Medications Ordered in UC: ?Medications - No data to display ? ?Disposition Upon Discharge:  ?Condition: stable for discharge home ?Home: take medications as prescribed; routine discharge instructions as discussed; follow up as advised. ? ?Patient presented with an acute illness with associated systemic symptoms and significant discomfort requiring urgent management. In my opinion, this is a condition that a prudent lay person (someone who possesses an average knowledge of health and medicine) may potentially expect to result in complications if not addressed urgently such as respiratory distress, impairment of bodily function or dysfunction of bodily organs.  ? ?Routine symptom specific, illness specific and/or disease specific instructions were discussed with the patient and/or caregiver at length.  ? ?As such, the patient has been evaluated and assessed,  work-up was performed and treatment was provided in alignment with urgent care protocols and evidence based medicine.  Patient/parent/caregiver has been advised that the patient may require follow up for further testing and trea

## 2022-03-18 LAB — CULTURE, GROUP A STREP (THRC)

## 2022-03-22 ENCOUNTER — Encounter: Payer: 59 | Admitting: Physical Therapy

## 2022-03-23 ENCOUNTER — Ambulatory Visit (INDEPENDENT_AMBULATORY_CARE_PROVIDER_SITE_OTHER): Payer: 59 | Admitting: Orthopedic Surgery

## 2022-03-23 ENCOUNTER — Ambulatory Visit (INDEPENDENT_AMBULATORY_CARE_PROVIDER_SITE_OTHER): Payer: 59

## 2022-03-23 ENCOUNTER — Encounter: Payer: Self-pay | Admitting: Orthopedic Surgery

## 2022-03-23 DIAGNOSIS — M542 Cervicalgia: Secondary | ICD-10-CM | POA: Diagnosis not present

## 2022-03-23 DIAGNOSIS — M79601 Pain in right arm: Secondary | ICD-10-CM

## 2022-03-25 ENCOUNTER — Encounter: Payer: Self-pay | Admitting: Orthopedic Surgery

## 2022-03-25 NOTE — Progress Notes (Signed)
? ?Office Visit Note ?  ?Patient: Leslie [name]           ?Date of Birth: January 18, 2001           ?MRN: [id number] ?Visit Date: 03/23/2022 ?Requested by: Fatima Sanger, FNP ?[phone] [address] ?[place],  [place] [address] ?PCP: Fatima Sanger, FNP ? ?Subjective: ?Chief Complaint  ?Patient presents with  ? Right Shoulder - Pain  ? ? ?HPI: Patient presents for evaluation of right shoulder and arm pain.  Reports long history of of chronic right shoulder pain.  Patient plays volleyball and did 7 college.  She was a Surveyor, minerals.  She has had prior treatment at another orthopedic office.  Tried injection along with physical therapy for 2 to 3 weeks without relief.  States her shoulder pain was worsened after the injection.  Pain started 8 years ago from overuse playing volleyball.  States that she played in pain frequently.  She was finished with the team in October 2022.  Pain wakes her from sleep at night.  Unable to lay on the right-hand side.  Describes some decreased range of motion as well as painful range of motion and popping in the shoulder.  Describes pain level at 9 out of 10.  Also reports some mechanical symptoms.  Patient reported shoulder pain with almost every practice.  Developed a duodenal ulcer secondary to Advil use.  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  This is a physical job.  She has a nonarthrogram MRI scan on the system which is reviewed.  This shows intact rotator cuff with no degenerative change of the [facility] joint and very mild attenuation of the posterior glenoid labrum which could represent normal anatomy. ?             ?ROS: All systems reviewed are negative as they relate to the chief complaint within the history of present illness.  Patient denies  fevers or chills. ? ? ?Assessment & Plan: ?Visit Diagnoses:  ?1. Neck pain   ? ? ?Plan: Impression is shoulder pain which could be radiculopathy.  She is an Hotel manager and so statistically speaking this likely  represents labral pathology.  Does not have too much in terms of multidirectional instability in the shoulder.  Mild pain with posterior force on the shoulder with positive O'Brien's sign on the right but not on the left.  No discrete AC joint tenderness.  I think she needs an MRI arthrogram to more fully evaluate the labrum particularly with the history of mechanical symptoms in the shoulder.  I think it is also possible that this could be referred pain from the neck.  She is having some symptoms which go down to the elbow.  MRI cervical spine also indicated due to long duration of symptoms.  Follow-up after those studies. ? ?Follow-Up Instructions: Return for after MRI.  ? ?Orders:  ?Orders Placed This Encounter  ?Procedures  ? XR Cervical Spine 2 or 3 views  ? ?No orders of the defined types were placed in this encounter. ? ? ? ? Procedures: ?No procedures performed ? ? ?Clinical Data: ?No additional findings. ? ?Objective: ?Vital Signs: LMP 03/01/2022  ? ?Physical Exam:  ? ?Constitutional: Patient appears well-developed ?HEENT:  ?Head: Normocephalic ?Eyes:EOM are normal ?Neck: Normal range of motion ?Cardiovascular: Normal rate ?Pulmonary/chest: Effort normal ?Neurologic: Patient is alert ?Skin: Skin is warm ?Psychiatric: Patient has normal mood and affect ? ? ?Ortho Exam: Ortho exam demonstrates good cervical  spine range of motion.  5 out of 5 grip EPL FPL interosseous wrist flexion extension bicep triceps and deltoid strength with no paresthesias C5-T1.  Reflexes symmetric bilateral biceps and triceps.  Radial pulse intact bilaterally.  Right shoulder examination demonstrates passive range of motion of 80/115/180.  O'Brien's testing positive on the right negative on the left.  Cannot get much in the way of coarse grinding or crepitus with labral load testing.  No radiating pain into the biceps with no tenderness over the bicipital groove.  Apprehension is negative anteriorly and equivocal posteriorly.  Less  than a centimeter sulcus sign. ? ?Specialty Comments:  ?No specialty comments available. ? ?Imaging: ?No results found. ? ? ?PMFS History: ?Patient Active Problem List  ? Diagnosis Date Noted  ? Vitamin B12 deficiency 05/10/2018  ? B12 deficiency 02/08/2018  ? ?Past Medical History:  ?Diagnosis Date  ? ADHD (attention deficit hyperactivity disorder)   ? Anxiety   ?  ?Family History  ?Adopted: Yes  ?  ?Past Surgical History:  ?Procedure Laterality Date  ? MYRINGOTOMY WITH TUBE PLACEMENT    ? TONSILLECTOMY    ? tubes    ? wisdom teeth    ? ?Social History  ? ?Occupational History  ? Occupation: Consulting civil engineer  ?Tobacco Use  ? Smoking status: Never  ? Smokeless tobacco: Never  ?Vaping Use  ? Vaping Use: Never used  ?Substance and Sexual Activity  ? Alcohol use: Yes  ?  Comment: 3 or 4 x/year  ? Drug use: No  ? Sexual activity: Not Currently  ?  Birth control/protection: Pill  ?  Comment: intercourse age 10, less than sexual partnes  ? ? ? ? ? ?

## 2022-03-28 NOTE — Addendum Note (Signed)
Addended byPrescott Parma on: 03/28/2022 09:10 AM ? ? Modules accepted: Orders ? ?

## 2022-03-29 ENCOUNTER — Encounter: Payer: 59 | Admitting: Physical Therapy

## 2022-03-31 ENCOUNTER — Encounter: Payer: 59 | Admitting: Physical Therapy

## 2022-04-05 ENCOUNTER — Ambulatory Visit: Payer: 59

## 2022-04-07 ENCOUNTER — Ambulatory Visit: Payer: 59 | Admitting: Physical Therapy

## 2022-04-12 ENCOUNTER — Encounter: Payer: 59 | Admitting: Physical Therapy

## 2022-04-14 ENCOUNTER — Encounter: Payer: 59 | Admitting: Physical Therapy

## 2022-04-19 ENCOUNTER — Ambulatory Visit
Admission: RE | Admit: 2022-04-19 | Discharge: 2022-04-19 | Disposition: A | Discharge status: Home / Self Care | Payer: 59 | Source: Ambulatory Visit | Attending: Orthopedic Surgery | Admitting: Orthopedic Surgery

## 2022-04-19 DIAGNOSIS — M79601 Pain in right arm: Secondary | ICD-10-CM

## 2022-04-19 DIAGNOSIS — M75101 Unspecified rotator cuff tear or rupture of right shoulder, not specified as traumatic: Secondary | ICD-10-CM | POA: Diagnosis not present

## 2022-04-19 DIAGNOSIS — G8929 Other chronic pain: Secondary | ICD-10-CM | POA: Diagnosis not present

## 2022-04-19 DIAGNOSIS — M50222 Other cervical disc displacement at C5-C6 level: Secondary | ICD-10-CM | POA: Diagnosis not present

## 2022-04-19 DIAGNOSIS — M25511 Pain in right shoulder: Secondary | ICD-10-CM | POA: Diagnosis not present

## 2022-04-19 DIAGNOSIS — M542 Cervicalgia: Secondary | ICD-10-CM

## 2022-04-19 IMAGING — MR MR SHOULDER*R* W/CM
5 series · 38 of 40 positions shown · IV contrast (agent unspecified)
Comparison: MRI examination dated [DATE]

CLINICAL DATA: Evaluate for labral tear. Right shoulder pain.
Volleyball injury 8 years ago.

EXAM:
MR ARTHROGRAM OF THE RIGHT SHOULDER
TECHNIQUE: Multiplanar, multisequence MR imaging of the right shoulder was
performed following the administration of intra-articular contrast.
CONTRAST:  See Injection Documentation.

[Series 3: T1 fat-sat · axial · 4.0mm · 0.27mm/px · z∈[-40,+58]mm · 11 of 21 slices shown (1 of 3)]
[im 1/21]
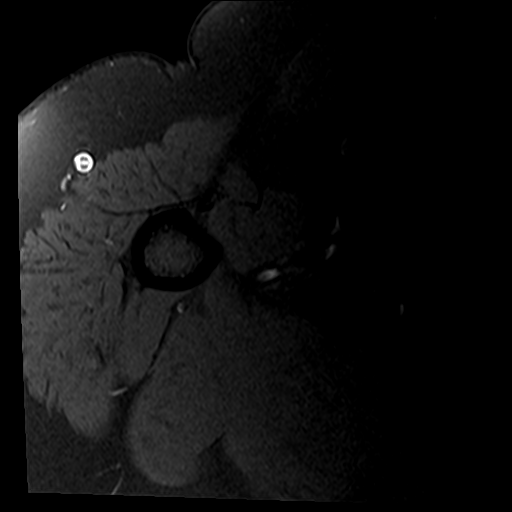
[im 3/21]
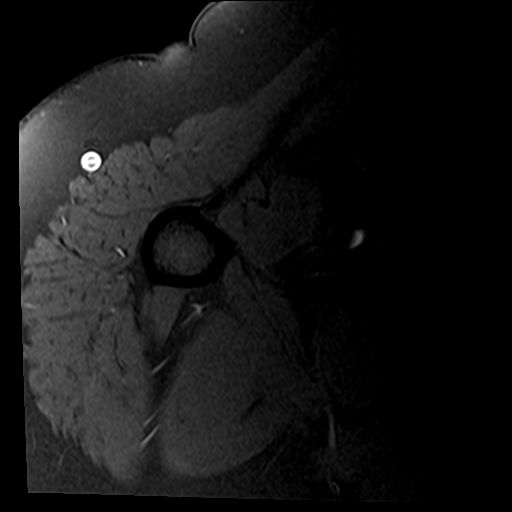
[im 5/21]
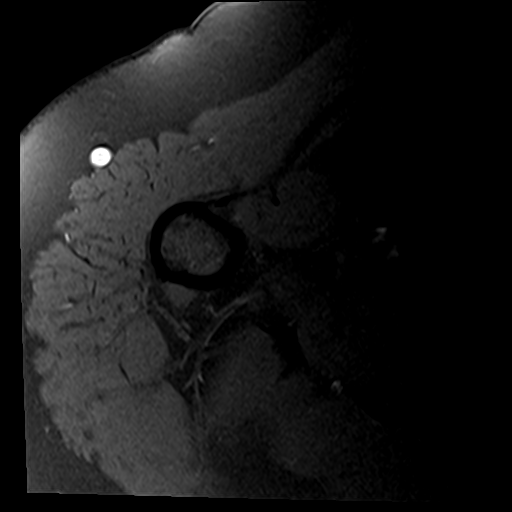
[im 7/21]
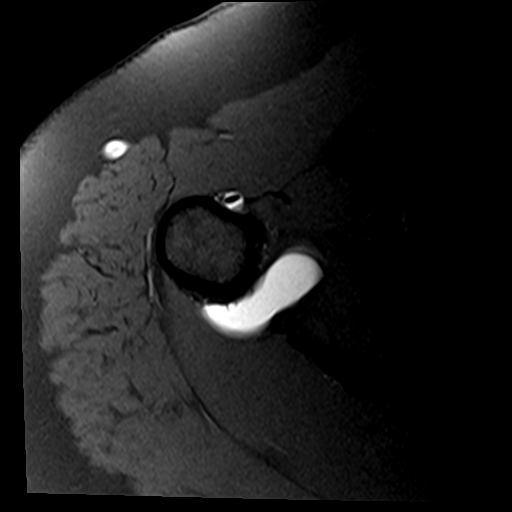
[im 9/21]
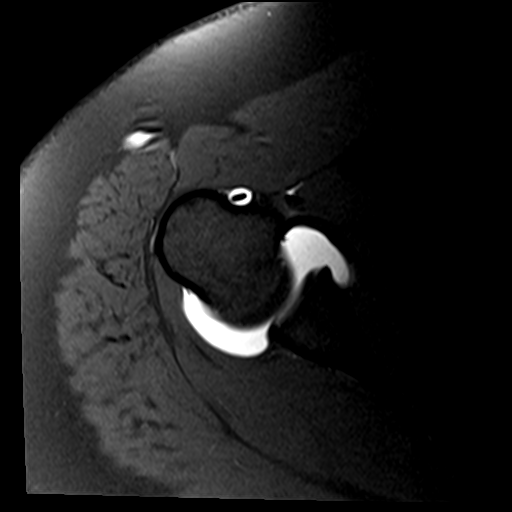
[im 11/21]
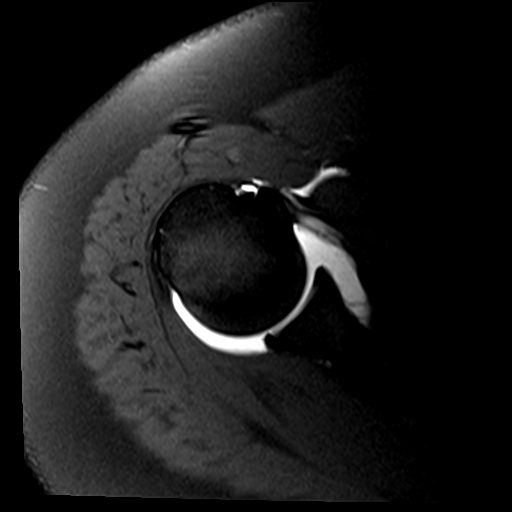
[im 13/21]
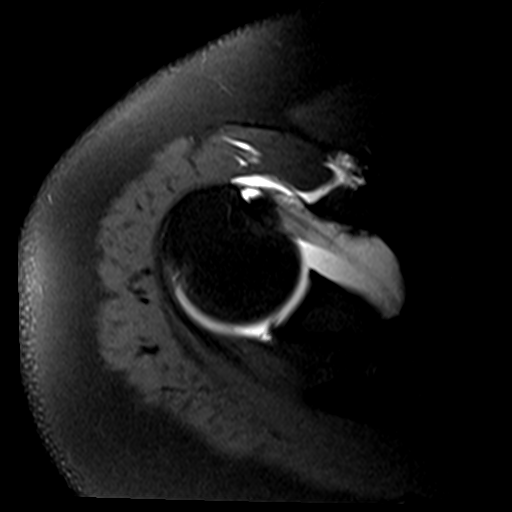
[im 15/21]
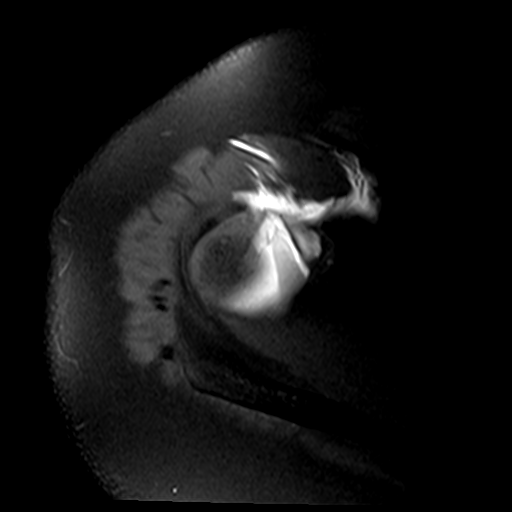
[im 17/21]
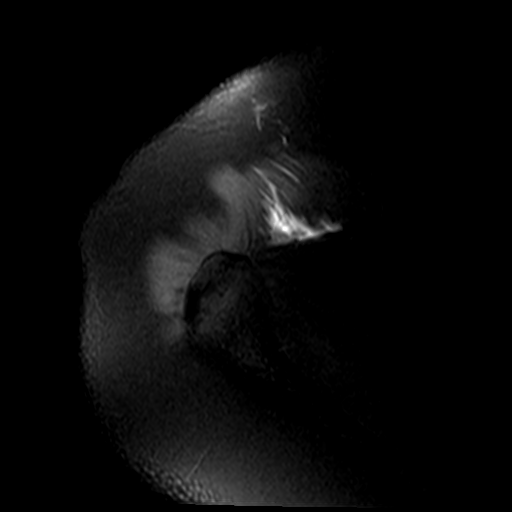
[im 19/21]
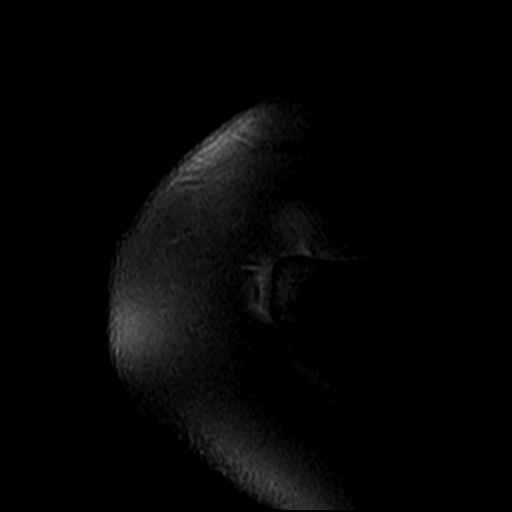
[im 21/21]
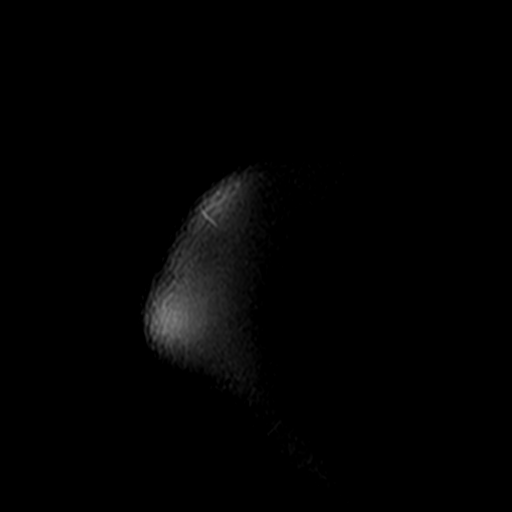

[Series 4: T2 fat-sat · oblique · 4.0mm · 0.55mm/px · 8 of 18 slices shown (1 of 2)]
[im 1/18]
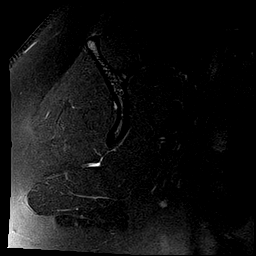
[im 3/18]
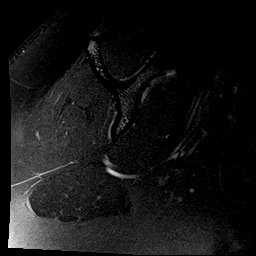
[im 5/18]
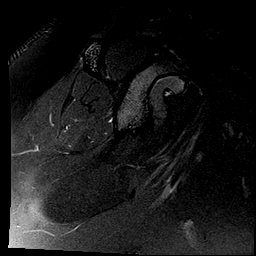
[im 8/18]
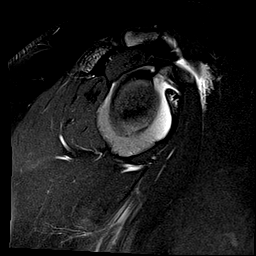
[im 10/18]
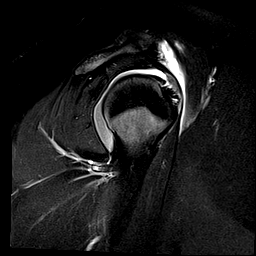
[im 13/18]
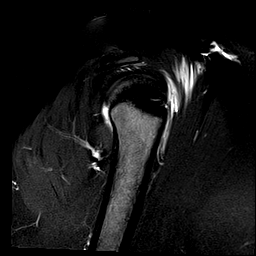
[im 15/18]
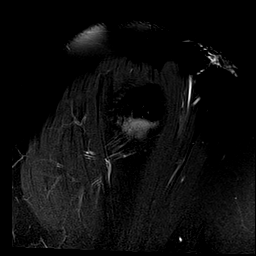
[im 18/18]
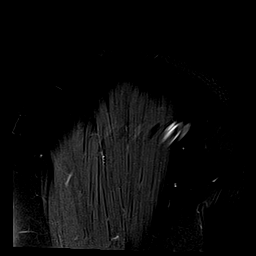

[Series 5: T1 fat-sat · oblique · 4.0mm · 0.55mm/px · 7 of 16 slices shown (2 of 3)]
[im 1/16]
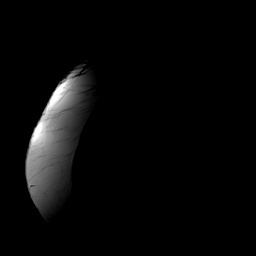
[im 3/16]
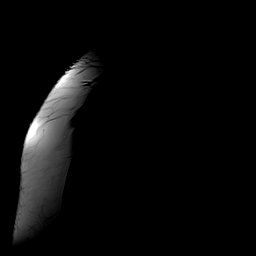
[im 6/16]
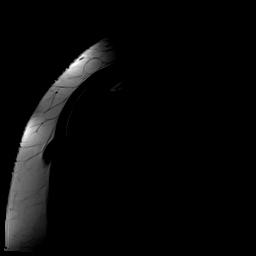
[im 8/16]
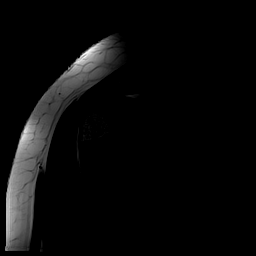
[im 11/16]
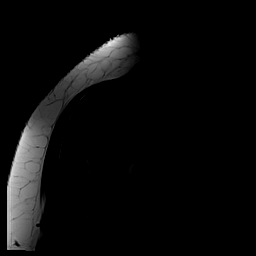
[im 13/16]
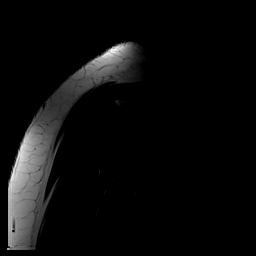
[im 16/16]
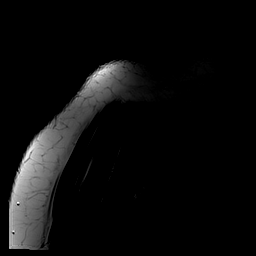

[Series 6: T1 fat-sat · oblique · 4.0mm · 0.55mm/px · 5 of 16 slices shown (3 of 3)]
[im 1/16]
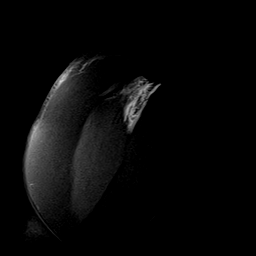
[im 3/16]
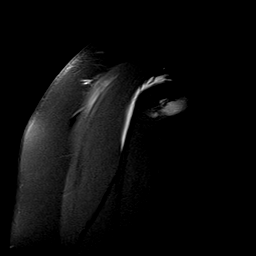
[im 6/16]
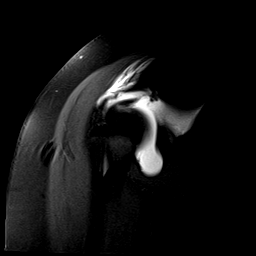
[im 8/16]
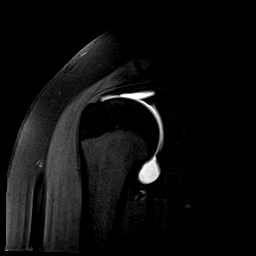
[im 11/16]
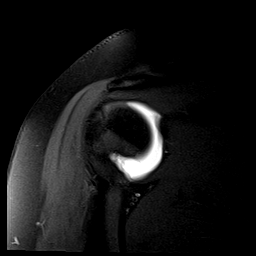

[Series 7: T2 fat-sat · oblique · 4.0mm · 0.55mm/px · 7 of 16 slices shown (2 of 2)]
[im 1/16]
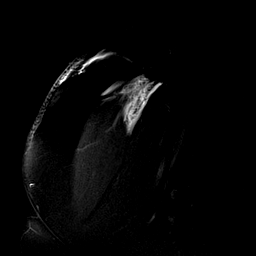
[im 3/16]
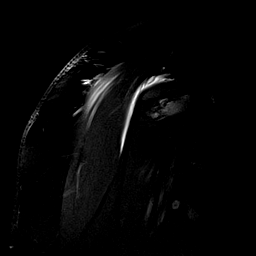
[im 6/16]
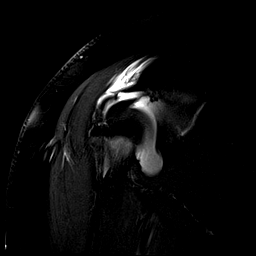
[im 8/16]
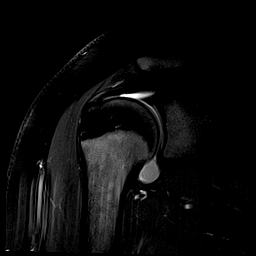
[im 11/16]
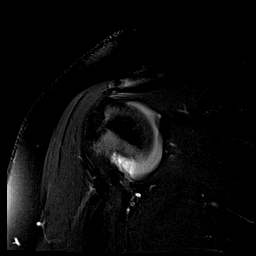
[im 13/16]
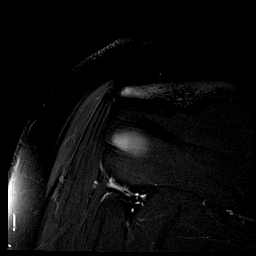
[im 16/16]
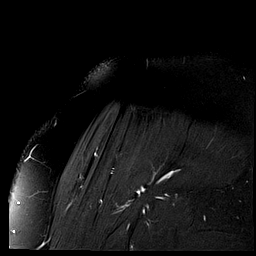

[38 of 40 positions shown; findings below may reference images not displayed]

FINDINGS: Rotator cuff: Supraspinatus tendon is intact infraspinatus tendon is
intact. Teres minor tendon is intact. Subscapularis tendon is
intact.

Muscles: No muscle atrophy or edema. No intramuscular fluid
collection or hematoma.

Biceps Long Head: Intraarticular and extraarticular portions of the
biceps tendon are intact.

Acromioclavicular Joint: No significant arthropathy of the
acromioclavicular joint. No subacromial/subdeltoid bursal fluid.

Glenohumeral Joint: Intraarticular contrast distending the joint
capsule. Normal glenohumeral ligaments. No chondral defect.

Labrum: The labrum is intact. Rounded contour of posterior labrum
without evidence of tear.

Bones: No fracture or dislocation. No aggressive osseous lesion.

Other: No fluid collection or hematoma.
IMPRESSION: 1. No evidence of labral tear. Rounded contour of the posterior
labrum, which could be a variant anatomy or mild degenerative
changes.

2.  Rotator cuff tendons are intact.

3.  No evidence of fracture or osteonecrosis.

## 2022-04-19 IMAGING — XA DG FLUORO GUIDE NDL PLC/BX
1 series · 1 of 1 positions shown · IV contrast (multihance)
Comparison: none

CLINICAL DATA: 20-year-old female with chronic right shoulder pain
concerning for labral tear

EXAM:
RIGHT SHOULDER INJECTION UNDER FLUOROSCOPY
TECHNIQUE: An appropriate skin entrance site was determined. The site was
marked, prepped with Betadine, draped in the usual sterile fashion,
and infiltrated locally with buffered Lidocaine. 22 gauge spinal
needle was advanced to the superomedial margin of the humeral head
under intermittent fluoroscopy. 1 ml of Lidocaine injected easily. A
mixture of 0.05 ml Multihance 10 ml of Omnipaque 300 was then used
to opacify the right shoulder capsule. No immediate complication.
FLUOROSCOPY:
Radiation Exposure Index (as provided by the fluoroscopic device):
6.6 mGy Kerma

[Series 1: ortho adipose · 1 of 1 slices shown]
[im 1/1]
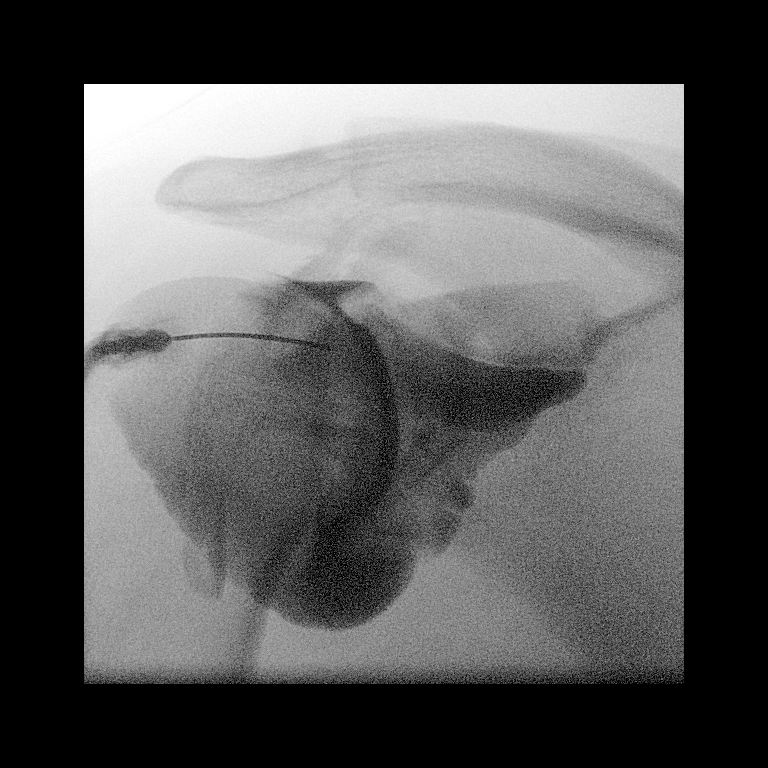

[1 of 1 positions shown; findings below may reference images not displayed]

FINDINGS: Technically successful shoulder arthrogram.
IMPRESSION: Technically successful right shoulder injection for MRI.

## 2022-04-19 MED ORDER — IOPAMIDOL (ISOVUE-M 200) INJECTION 41%
13.0000 mL | Freq: Once | INTRAMUSCULAR | Status: AC
  Administered 2022-04-19: 13 mL via INTRA_ARTICULAR

## 2022-04-25 ENCOUNTER — Ambulatory Visit: Payer: Self-pay

## 2022-04-25 ENCOUNTER — Ambulatory Visit: Payer: 59 | Admitting: Orthopedic Surgery

## 2022-04-25 DIAGNOSIS — M7521 Bicipital tendinitis, right shoulder: Secondary | ICD-10-CM | POA: Diagnosis not present

## 2022-04-25 DIAGNOSIS — M79601 Pain in right arm: Secondary | ICD-10-CM | POA: Diagnosis not present

## 2022-04-27 ENCOUNTER — Ambulatory Visit: Payer: 59 | Admitting: Orthopedic Surgery

## 2022-05-01 ENCOUNTER — Encounter: Payer: Self-pay | Admitting: Orthopedic Surgery

## 2022-05-01 MED ORDER — METHYLPREDNISOLONE ACETATE 40 MG/ML IJ SUSP
40.0000 mg | INTRAMUSCULAR | Status: AC | PRN
  Administered 2022-04-25: 40 mg via INTRA_ARTICULAR

## 2022-05-01 MED ORDER — LIDOCAINE HCL 1 % IJ SOLN
5.0000 mL | INTRAMUSCULAR | Status: AC | PRN
  Administered 2022-04-25: 5 mL

## 2022-05-01 MED ORDER — BUPIVACAINE HCL 0.5 % IJ SOLN
3.0000 mL | INTRAMUSCULAR | Status: AC | PRN
  Administered 2022-04-25: 3 mL via INTRA_ARTICULAR

## 2022-05-01 NOTE — Progress Notes (Signed)
? ?Office Visit Note ?  ?Patient: Leslie [name]           ?Date of Birth: 02-13-2001           ?MRN: [id number] ?Visit Date: 04/25/2022 ?Requested by: Fatima Sanger, FNP ?[phone] [address] ?[place],  [place] [address] ?PCP: Fatima Sanger, FNP ? ?Subjective: ?Chief Complaint  ?Patient presents with  ? Other  ?  Scan review  ? ? ?HPI: Patient presents for follow-up of C-spine and right shoulder MRI scans.  She reports some occasional numbness and tingling in the fingers for 3 months.  Almost all of her pain is anterior around the subscapularis and biceps tendon sheath.  Some radiation into the neck.  Has some occasional scapular pain but no actual pain in the cervical spine region.  Is painful for her to move her arm up overhead.  Describes some numbness in the middle and ring fingers but no other fingers. ? ?MRI of the right shoulder demonstrates no evidence of labral tear.  Rotator cuff tendons intact.  No fracture or osteonecrosis.  MRI cervical spine demonstrates small central disc protrusion at C4-5 without significant stenosis or cord impingement.  Shallow right paracentral disc protrusion at C5-6 without significant stenosis or cord impingement.  We did examine the nose on the scan together and these are very small bulging disc.  I think it is possible but unlikely that this would be accounting for her symptoms. ?             ?ROS: All systems reviewed are negative as they relate to the chief complaint within the history of present illness.  Patient denies  fevers or chills. ? ? ?Assessment & Plan: ?Visit Diagnoses:  ?1. Right arm pain   ? ? ?Plan: Impression is right arm and shoulder pain.  Looks more like possible biceps tendon pathology versus neck radiculopathy.  Would like to try ultrasound-guided injection into the biceps tendon sheath today.  If that is not effective then we could consider arthroscopic evaluation of the shoulder versus cervical spine ESI for diagnostic and  therapeutic purposes.  Again most of her pain does seem to localize to the shoulder itself.  Follow-up in 6 weeks for clinical recheck. ? ?Follow-Up Instructions: Return in about 6 weeks (around 06/06/2022).  ? ?Orders:  ?Orders Placed This Encounter  ?Procedures  ? US Guided Needle Placement - No Linked Charges  ? ?No orders of the defined types were placed in this encounter. ? ? ? ? Procedures: ?Large Joint Inj: R glenohumeral on 04/25/2022 9:52 PM ?Indications: diagnostic evaluation and pain ?Details: 18 G 1.5 in needle, ultrasound-guided anterior approach ? ?Arthrogram: No ? ?Medications: 3 mL bupivacaine 0.5 %; 40 mg methylPREDNISolone acetate 40 MG/ML; 5 mL lidocaine 1 % ?Outcome: tolerated well, no immediate complications ?Procedure, treatment alternatives, risks and benefits explained, specific risks discussed. Consent was given by the patient. Immediately prior to procedure a time out was called to verify the correct patient, procedure, equipment, support staff and site/side marked as required. Patient was prepped and draped in the usual sterile fashion.  ? ?Biceps tendon sheath under the transverse humeral ligament injected ? ? ?Clinical Data: ?No additional findings. ? ?Objective: ?Vital Signs: There were no vitals taken for this visit. ? ?Physical Exam:  ? ?Constitutional: Patient appears well-developed ?HEENT:  ?Head: Normocephalic ?Eyes:EOM are normal ?Neck: Normal range of motion ?Cardiovascular: Normal rate ?Pulmonary/chest: Effort normal ?Neurologic: Patient is alert ?Skin: Skin is warm ?Psychiatric: Patient has  normal mood and affect ? ? ?Ortho Exam: Ortho exam demonstrates range of motion on the left of 40/100/170.  On the right she has 40/90/150.  Rotator cuff strength intact to infraspinatus supraspinatus and subscap muscle testing on the right-hand side with no coarse grinding or crepitus present.  No discrete AC joint tenderness is present right left-hand side.  Motor or sensory function to the  hand intact.  No other masses lymphadenopathy or skin changes noted in that shoulder girdle region.  O'Brien's testing equivocal on the right negative on the left neck range of motion full ? ?Specialty Comments:  ?No specialty comments available. ? ?Imaging: ?No results found. ? ? ?PMFS History: ?Patient Active Problem List  ? Diagnosis Date Noted  ? Vitamin B12 deficiency 05/10/2018  ? B12 deficiency 02/08/2018  ? ?Past Medical History:  ?Diagnosis Date  ? ADHD (attention deficit hyperactivity disorder)   ? Anxiety   ?  ?Family History  ?Adopted: Yes  ?  ?Past Surgical History:  ?Procedure Laterality Date  ? MYRINGOTOMY WITH TUBE PLACEMENT    ? TONSILLECTOMY    ? tubes    ? wisdom teeth    ? ?Social History  ? ?Occupational History  ? Occupation: Consulting civil engineer  ?Tobacco Use  ? Smoking status: Never  ? Smokeless tobacco: Never  ?Vaping Use  ? Vaping Use: Never used  ?Substance and Sexual Activity  ? Alcohol use: Yes  ?  Comment: 3 or 4 x/year  ? Drug use: No  ? Sexual activity: Not Currently  ?  Birth control/protection: Pill  ?  Comment: intercourse age 1, less than sexual partnes  ? ? ? ? ? ?

## 2022-05-09 ENCOUNTER — Ambulatory Visit: Payer: 59

## 2022-05-09 ENCOUNTER — Ambulatory Visit: Payer: 59 | Admitting: Orthopedic Surgery

## 2022-05-09 DIAGNOSIS — M19011 Primary osteoarthritis, right shoulder: Secondary | ICD-10-CM

## 2022-05-09 DIAGNOSIS — M79601 Pain in right arm: Secondary | ICD-10-CM | POA: Diagnosis not present

## 2022-05-10 ENCOUNTER — Encounter: Payer: Self-pay | Admitting: Orthopedic Surgery

## 2022-05-10 MED ORDER — LIDOCAINE HCL 1 % IJ SOLN
3.0000 mL | INTRAMUSCULAR | Status: AC | PRN
  Administered 2022-05-09: 3 mL

## 2022-05-10 MED ORDER — METHYLPREDNISOLONE ACETATE 40 MG/ML IJ SUSP
13.3300 mg | INTRAMUSCULAR | Status: AC | PRN
  Administered 2022-05-09: 13.33 mg via INTRA_ARTICULAR

## 2022-05-10 MED ORDER — BUPIVACAINE HCL 0.25 % IJ SOLN
0.6600 mL | INTRAMUSCULAR | Status: AC | PRN
  Administered 2022-05-09: .66 mL via INTRA_ARTICULAR

## 2022-05-10 NOTE — Progress Notes (Signed)
? ?Office Visit Note ?  ?Patient: Leslie [name]           ?Date of Birth: June 22, 2001           ?MRN: [id number] ?Visit Date: 05/09/2022 ?Requested by: Fatima Sanger, FNP ?[phone] [address] ?[place],  [place] [address] ?PCP: Fatima Sanger, FNP ? ?Subjective: ?Chief Complaint  ?Patient presents with  ? Right Shoulder - Follow-up  ? ? ?HPI: Patient presents now with continued right shoulder pain.  She did not get any relief from the biceps tendon injection on 04/25/2022.  Having some loose muscle spasms under the right scapula since the injection.  Taking Tylenol ibuprofen and Robaxin.  She has had an MRI scan of her cervical spine which was unremarkable.  MRI scan of the shoulder also unremarkable but on my review she does have a little edema in the [facility] joint and may have a superior labral tear on the axial views at the top of the glenoid.  Rotator cuff intact. ?             ?ROS: All systems reviewed are negative as they relate to the chief complaint within the history of present illness.  Patient denies  fevers or chills. ? ? ?Assessment & Plan: ?Visit Diagnoses:  ?1. Right arm pain   ? ? ?Plan: Impression is right shoulder pain unclear etiology.  I think a diagnostic and therapeutic injection with ultrasound into the [facility] joint is indicated.  See her back in 3 weeks for clinical recheck.  If that injection does not help even temporarily then her decision making is essentially limited what she has versus shoulder arthroscopy which may or may not show [place] the pathology present. ? ?Follow-Up Instructions: Return in about 3 weeks (around 05/30/2022).  ? ?Orders:  ?Orders Placed This Encounter  ?Procedures  ? US Guided Needle Placement - No Linked Charges  ? ?No orders of the defined types were placed in this encounter. ? ? ? ? Procedures: ?Medium Joint Inj: R acromioclavicular on 05/09/2022 6:33 AM ?Indications: pain and diagnostic evaluation ?Details: 27 G 1.5 in needle, ultrasound-guided superior  approach ?Medications: 13.33 mg methylPREDNISolone acetate 40 MG/ML; 0.66 mL bupivacaine 0.25 %; 3 mL lidocaine 1 % ?Outcome: tolerated well, no immediate complications ?Procedure, treatment alternatives, risks and benefits explained, specific risks discussed. Consent was given by the patient. Immediately prior to procedure a time out was called to verify the correct patient, procedure, equipment, support staff and site/side marked as required. Patient was prepped and draped in the usual sterile fashion.  ? ? ? ? ?Clinical Data: ?No additional findings. ? ?Objective: ?Vital Signs: There were no vitals taken for this visit. ? ?Physical Exam:  ? ?Constitutional: Patient appears well-developed ?HEENT:  ?Head: Normocephalic ?Eyes:EOM are normal ?Neck: Normal range of motion ?Cardiovascular: Normal rate ?Pulmonary/chest: Effort normal ?Neurologic: Patient is alert ?Skin: Skin is warm ?Psychiatric: Patient has normal mood and affect ? ? ?Ortho Exam: Ortho exam demonstrates some pain with forward flexion crossarm adduction on the right.  Asymmetric AC joint tenderness right versus left.  I do not detect any coarse grinding or crepitus today with internal/external rotation.  O'Brien's testing positive on the right speeds testing equivocal on the right negative on the left.  Negative apprehension relocation testing ? ?Specialty Comments:  ?No specialty comments available. ? ?Imaging: ?US Guided Needle Placement - No Linked Charges ? ?Result Date: 05/10/2022 ?Ultrasound imaging demonstrates needle placement into the acromioclavicular joint on the right-hand side  with extravasation of fluid and no complicating features  ? ? ?PMFS History: ?Patient Active Problem List  ? Diagnosis Date Noted  ? Vitamin B12 deficiency 05/10/2018  ? B12 deficiency 02/08/2018  ? ?Past Medical History:  ?Diagnosis Date  ? ADHD (attention deficit hyperactivity disorder)   ? Anxiety   ?  ?Family History  ?Adopted: Yes  ?  ?Past Surgical History:   ?Procedure Laterality Date  ? MYRINGOTOMY WITH TUBE PLACEMENT    ? TONSILLECTOMY    ? tubes    ? wisdom teeth    ? ?Social History  ? ?Occupational History  ? Occupation: Consulting civil engineer  ?Tobacco Use  ? Smoking status: Never  ? Smokeless tobacco: Never  ?Vaping Use  ? Vaping Use: Never used  ?Substance and Sexual Activity  ? Alcohol use: Yes  ?  Comment: 3 or 4 x/year  ? Drug use: No  ? Sexual activity: Not Currently  ?  Birth control/protection: Pill  ?  Comment: intercourse age 71, less than sexual partnes  ? ? ? ? ? ?

## 2022-05-13 ENCOUNTER — Other Ambulatory Visit (HOSPITAL_COMMUNITY): Payer: Self-pay

## 2022-05-25 ENCOUNTER — Other Ambulatory Visit (HOSPITAL_COMMUNITY): Payer: Self-pay

## 2022-05-30 ENCOUNTER — Encounter: Payer: Self-pay | Admitting: Orthopedic Surgery

## 2022-05-30 ENCOUNTER — Ambulatory Visit: Payer: 59 | Admitting: Orthopedic Surgery

## 2022-05-30 DIAGNOSIS — M79601 Pain in right arm: Secondary | ICD-10-CM | POA: Diagnosis not present

## 2022-05-30 NOTE — Progress Notes (Signed)
Office Visit Note   Patient: Leslie [name]           Date of Birth: [birth year]           MRN: [id number] Visit Date: 05/30/2022 Requested by: Holland Commons, [place],   [address] PCP: Holland Commons, FNP  Subjective: Chief Complaint  Patient presents with   Right Shoulder - Follow-up    HPI: Patient presents for evaluation of right shoulder pain.  She had an injection in the [facility] joint on 05/09/2022.  That really did not help her much.  States that the pain is localized more in the front of the shoulder.  Reports decreased range of motion.  Robaxin does not help.  She is starting nursing school in about 3 months.  She also had a biceps tendon injection of the sheath which did not help.  Takes Advil and Tylenol as needed.              ROS: All systems reviewed are negative as they relate to the chief complaint within the history of present illness.  Patient denies  fevers or chills.   Assessment & Plan: Visit Diagnoses:  1. Right arm pain     Plan: Impression is right shoulder pain.  She has had AC joint injection which did not help as well as biceps tendon sheath and intra-articular injection also which did not help.  MRI scan is pretty underwhelming in terms of definitive operative pathology.  The labrum and rotator cuff appear intact but I think there is a small outside chance she could have a superior labral tear and pathology.  Pec tendon looks intact on scan and on exam.  Plan at this time is physical therapy here for below shoulder level rotator cuff strengthening exercises and avoid volleyball for about 6 weeks to really give this a chance to heal.  If it does not heal then diagnostic arthroscopy would be the next step but there is a fair chance that no definitive pathology will be identified.  The only thing that I think could be going on would be a slap lesion which is simply not visible on the MRI arthrogram.  This does happen  about 20% of the time.  She will follow-up with [place] as needed  Follow-Up Instructions: No follow-ups on file.   Orders:  Orders Placed This Encounter  Procedures   Ambulatory referral to Physical Therapy   No orders of the defined types were placed in this encounter.     Procedures: No procedures performed   Clinical Data: No additional findings.  Objective: Vital Signs: There were no vitals taken for this visit.  Physical Exam:   Constitutional: Patient appears well-developed HEENT:  Head: Normocephalic Eyes:EOM are normal Neck: Normal range of motion Cardiovascular: Normal rate Pulmonary/chest: Effort normal Neurologic: Patient is alert Skin: Skin is warm Psychiatric: Patient has normal mood and affect   Ortho Exam: Ortho exam essentially unchanged.  Patient does localize pain to the anterior aspect of her shoulder.  Cuff strength is good with no real asymmetric AC joint tenderness.  Specialty Comments:  No specialty comments available.  Imaging: No results found.   PMFS History: Patient Active Problem List   Diagnosis Date Noted   Vitamin B12 deficiency 05/10/2018   B12 deficiency 02/08/2018   Past Medical History:  Diagnosis Date   ADHD (attention deficit hyperactivity disorder)    Anxiety     Family History  Adopted: Yes  Past Surgical History:  Procedure Laterality Date   MYRINGOTOMY WITH TUBE PLACEMENT     TONSILLECTOMY     tubes     wisdom teeth     Social History   Occupational History   Occupation: Ship broker  Tobacco Use   Smoking status: Never   Smokeless tobacco: Never  Vaping Use   Vaping Use: Never used  Substance and Sexual Activity   Alcohol use: Yes    Comment: 3 or 4 x/year   Drug use: No   Sexual activity: Not Currently    Birth control/protection: Pill    Comment: intercourse age 12, less than sexual partnes

## 2022-06-01 ENCOUNTER — Other Ambulatory Visit (HOSPITAL_COMMUNITY): Payer: Self-pay

## 2022-06-01 DIAGNOSIS — L700 Acne vulgaris: Secondary | ICD-10-CM | POA: Diagnosis not present

## 2022-06-01 MED ORDER — CLINDAMYCIN PHOS-BENZOYL PEROX 1.2-5 % EX GEL
1.0000 | Freq: Every morning | CUTANEOUS | 11 refills | Status: DC
  Filled 2022-06-01: qty 45, 30d supply, fill #0
  Filled 2023-01-11: qty 45, 30d supply, fill #1

## 2022-06-01 MED ORDER — DROSPIRENONE-ETHINYL ESTRADIOL 3-0.03 MG PO TABS
1.0000 | ORAL_TABLET | Freq: Every day | ORAL | 11 refills | Status: DC
  Filled 2022-06-01: qty 84, 84d supply, fill #0

## 2022-06-01 MED ORDER — TRETINOIN 0.05 % EX CREA
1.0000 | TOPICAL_CREAM | Freq: Every evening | CUTANEOUS | 11 refills | Status: DC
  Filled 2022-06-01: qty 20, 30d supply, fill #0
  Filled 2023-01-11: qty 20, 30d supply, fill #1

## 2022-06-02 ENCOUNTER — Other Ambulatory Visit (HOSPITAL_COMMUNITY): Payer: Self-pay

## 2022-06-03 ENCOUNTER — Ambulatory Visit: Payer: 59 | Admitting: Rehabilitative and Restorative Service Providers"

## 2022-06-03 ENCOUNTER — Encounter: Payer: Self-pay | Admitting: Rehabilitative and Restorative Service Providers"

## 2022-06-03 DIAGNOSIS — G8929 Other chronic pain: Secondary | ICD-10-CM

## 2022-06-03 DIAGNOSIS — M6281 Muscle weakness (generalized): Secondary | ICD-10-CM | POA: Diagnosis not present

## 2022-06-03 DIAGNOSIS — M25511 Pain in right shoulder: Secondary | ICD-10-CM | POA: Diagnosis not present

## 2022-06-03 DIAGNOSIS — R293 Abnormal posture: Secondary | ICD-10-CM | POA: Diagnosis not present

## 2022-06-03 DIAGNOSIS — M25611 Stiffness of right shoulder, not elsewhere classified: Secondary | ICD-10-CM | POA: Diagnosis not present

## 2022-06-03 NOTE — Therapy (Signed)
OUTPATIENT PHYSICAL THERAPY SHOULDER EVALUATION   Patient Name: Leslie [name] MRN: [id number] DOB:[birth year], 21 y.o., female Today's Date: 06/03/2022   PT End of Session - 06/03/22 0943     Visit Number 1    Number of Visits 16    PT Start Time 0804    PT Stop Time 0847    PT Time Calculation (min) 43 min    Activity Tolerance Patient tolerated treatment well;No increased pain    Behavior During Therapy WFL for tasks assessed/performed             Past Medical History:  Diagnosis Date   ADHD (attention deficit hyperactivity disorder)    Anxiety    Past Surgical History:  Procedure Laterality Date   MYRINGOTOMY WITH TUBE PLACEMENT     TONSILLECTOMY     tubes     wisdom teeth     Patient Active Problem List   Diagnosis Date Noted   Vitamin B12 deficiency 05/10/2018   B12 deficiency 02/08/2018    PCP: Loletta SpecterMary Ellen Prevosy, FNP  REFERRING PROVIDER: Cammy CopaGregory Scott Dean, MD  REFERRING DIAG: [phone] (ICD-10-CM) - Right arm pain   THERAPY DIAG:  Abnormal posture  Muscle weakness (generalized)  Stiffness of right shoulder, not elsewhere classified  Chronic right shoulder pain  Rationale for Evaluation and Treatment Rehabilitation  ONSET DATE: Chronic, 8 years history  SUBJECTIVE:                                                                                                                                                                                      SUBJECTIVE STATEMENT: [name] has a 8 year history of R shoulder pain.  1 previous cortisone shot, some relief.  Was in a sling for 2-3 weeks.  Now that she is out of college, she wants to get this taken care of.  She is unable to sleep on her R side.  PERTINENT HISTORY: None  PAIN:  Are you having pain? Yes: NPRS scale: 0-7/10 Pain location: R shoulder and arm pain.  Occasional R hand tingling (1X/week) and scapular pain (1-2X/week) Pain description: Sharp Aggravating factors: Crochet, pulling,  pushing, leaning on the R side Relieving factors: Robaxin, change position  PRECAUTIONS: None  WEIGHT BEARING RESTRICTIONS No  FALLS:  Has patient fallen in last 6 months? No  LIVING ENVIRONMENT: Lives with: lives with their family Lives in: House/apartment Stairs:  No problems Has following equipment at home: None  OCCUPATION: Works part-time at [employer]  PLOF: Independent  PATIENT GOALS Improve pain to a 0-3/10 level, be able to coach volleyball  OBJECTIVE:   DIAGNOSTIC FINDINGS:  IMPRESSION: 1. Small central disc protrusion at  C4-5 without significant stenosis or cord impingement. 2. Shallow right paracentral disc protrusion at C5-6 without significant stenosis or cord impingement. 3. Otherwise unremarkable MRI of the cervical spine.  IMPRESSION: 1. No evidence of labral tear. Rounded contour of the posterior labrum, which could be a variant anatomy or mild degenerative changes.   2.  Rotator cuff tendons are intact.   3.  No evidence of fracture or osteonecrosis. PATIENT SURVEYS:  FOTO 55 (Goal 68)  COGNITION:  Overall cognitive status: Within functional limits for tasks assessed     SENSATION: Mckenzy notes occasional R hand tingling  POSTURE: IR and protracted shoulders  UPPER EXTREMITY ROM:   Active neck, passive shoulder ROM Right eval Left eval  Shoulder flexion 130 165  Shoulder extension    Shoulder abduction    Shoulder horizontal adduction 30 40  Shoulder internal rotation 65 75  Shoulder external rotation 90 80  Elbow flexion    Cervical extension 55   Cervical lateral flexion 35 30  Cervical rotation  60 60  Wrist ulnar deviation    Wrist radial deviation    Wrist pronation    Wrist supination    (Blank rows = not tested)  UPPER EXTREMITY Strength:  Hand held dynamometer in pounds Right eval Left eval  Cervical lateral flexion 12.1 15.3  Cervical extension 21.7    Shoulder abduction    Shoulder adduction    Shoulder  internal rotation 29.3 30.0  Shoulder external rotation 11.4 20.2  Middle trapezius    Lower trapezius    Elbow flexion    Elbow extension    Wrist flexion    Wrist extension    Wrist ulnar deviation    Wrist radial deviation    Wrist pronation    Wrist supination    Grip strength (lbs)    (Blank rows = not tested)    TODAY'S TREATMENT:  06/03/2022 Exercise: Shoulder blade pinches 5X 5 seconds Cervical extension Isometrics 5X 5 seconds Shoulder ER theraband 10X 3 seconds slow eccentrics Red  Functional Activities: Long discussion regarding exam findings, shoulder and cervical spine anatomy and how they may relate to her R UE, scapular and hand symptoms  PATIENT EDUCATION: Education details: See above Person educated: Patient Education method: Explanation, Demonstration, Tactile cues, Verbal cues, and Handouts Education comprehension: verbalized understanding, returned demonstration, verbal cues required, tactile cues required, and needs further education   HOME EXERCISE PROGRAM: Access Code: [email or web address] URL: [email or web address] Date: 06/03/2022 Prepared by: Pauletta Browns  Exercises - Standing Scapular Retraction  - 5 x daily - 7 x weekly - 1 sets - 5 reps - 5 second hold - Standing Isometric Cervical Extension with Manual Resistance  - 5 x daily - 7 x weekly - 1 sets - 5 reps - 5 hold - Shoulder External Rotation with Anchored Resistance  - 2 x daily - 7 x weekly - 1-2 sets - 10 reps - 3 hold  ASSESSMENT:  CLINICAL IMPRESSION: Patient is a 21 y.o. female who was seen today for physical therapy evaluation and treatment for R UE pain.  Alaze has a mixed bag of clinical findings that suggest a cervical and shoulder component to her pain.  Postural correction will be important as will cervical, scapular and rotator cuff strength.  This has been a problem for 8 years and it would not be surprising if this was a 8-12 week rehabilitation.  She had previous PT that  she reports discontent with.  Previous PT was a combination of  dry needling and aggressive strength activities.  Her prognosis is good with a comprehensive approach with appropriate progressions at the right time.   OBJECTIVE IMPAIRMENTS decreased activity tolerance, decreased endurance, decreased knowledge of condition, decreased ROM, decreased strength, decreased safety awareness, impaired perceived functional ability, impaired flexibility, impaired UE functional use, postural dysfunction, and pain.   ACTIVITY LIMITATIONS carrying, lifting, sleeping, and caring for others  PARTICIPATION LIMITATIONS: community activity, occupation, and crochet  PERSONAL FACTORS  No personal factors  are also affecting patient's functional outcome.   REHAB POTENTIAL: Good  CLINICAL DECISION MAKING: Evolving/moderate complexity  EVALUATION COMPLEXITY: Moderate   GOALS: Goals reviewed with patient? Yes  SHORT TERM GOALS: Target date: 07/01/2022  (Remove Blue Hyperlink)  Taffy will improve R shoulder flexion to 170 degrees and horizontal adduction to 40 degrees. Baseline: 130 and 30 respectively Goal status: INITIAL  2.  Kambree will be independent with her initial (1st 2 visit) HEP. Baseline: Started 06/03/2022 Goal status: INITIAL   LONG TERM GOALS: Target date: 07/29/2022  (Remove Blue Hyperlink)  Improve FOTO to 68 in 9 visits. Baseline: 55 Goal status: INITIAL  2.  Anysia will report R UE and scapular pain consistently 0-3/10 on the Numeric Pain Rating Scale with no complaints of peripheral symptoms. Baseline: R UE, hand and scapular symptoms can be 7/10 Goal status: INITIAL  3.  Improve cervical extension strength to at least 35 pounds and lateral bending to at least 22 pounds. Baseline: 21 and 15/12 pounds respectively Goal status: INITIAL  4.  Improve R shoulder strength for IR to 36 and ER to 24 pounds. Baseline: 29 and 11 respectively Goal status: INITIAL  5.  Improve cervical  extension AROM to 65 degrees and lateral bending to 35/35. Baseline: 55 and 30/35 respectively Goal status: INITIAL  6.  Blanchie will be independent with her long term HEP. Baseline: Started 06/03/2022 Goal status: INITIAL   PLAN: PT FREQUENCY: 1-2x/week  PT DURATION: 8 weeks  PLANNED INTERVENTIONS: Therapeutic exercises, Therapeutic activity, Neuromuscular re-education, Patient/Family education, Joint mobilization, Cryotherapy, Traction, and Manual therapy  PLAN FOR NEXT SESSION: Review HEP, progress scapular and RTC strength, postural strength.  Practical posture and body mechanics review and add as needed.   Cherlyn Cushing, PT, MPT 06/03/2022, 9:46 AM

## 2022-06-06 ENCOUNTER — Encounter: Payer: Self-pay | Admitting: Physical Therapy

## 2022-06-06 ENCOUNTER — Ambulatory Visit: Payer: 59 | Admitting: Physical Therapy

## 2022-06-06 DIAGNOSIS — M6281 Muscle weakness (generalized): Secondary | ICD-10-CM | POA: Diagnosis not present

## 2022-06-06 DIAGNOSIS — M25511 Pain in right shoulder: Secondary | ICD-10-CM

## 2022-06-06 DIAGNOSIS — M25611 Stiffness of right shoulder, not elsewhere classified: Secondary | ICD-10-CM | POA: Diagnosis not present

## 2022-06-06 DIAGNOSIS — R293 Abnormal posture: Secondary | ICD-10-CM | POA: Diagnosis not present

## 2022-06-06 DIAGNOSIS — G8929 Other chronic pain: Secondary | ICD-10-CM

## 2022-06-06 NOTE — Therapy (Signed)
OUTPATIENT PHYSICAL THERAPY TREATMENT NOTE   Patient Name: Leslie [name] MRN: [id number] DOB:2001-04-29, 21 y.o., female Today's Date: 06/06/2022  PCP: Loletta Specter, FNP REFERRING PROVIDER: Cammy Copa, MD  END OF SESSION:   PT End of Session - 06/06/22 1448     Visit Number 2    Number of Visits 16    PT Start Time 1430    PT Stop Time 1510    PT Time Calculation (min) 40 min    Activity Tolerance Patient tolerated treatment well;No increased pain    Behavior During Therapy WFL for tasks assessed/performed             Past Medical History:  Diagnosis Date   ADHD (attention deficit hyperactivity disorder)    Anxiety    Past Surgical History:  Procedure Laterality Date   MYRINGOTOMY WITH TUBE PLACEMENT     TONSILLECTOMY     tubes     wisdom teeth     Patient Active Problem List   Diagnosis Date Noted   Vitamin B12 deficiency 05/10/2018   B12 deficiency 02/08/2018    REFERRING DIAG:  M79.601 (ICD-10-CM) - Right arm pain   THERAPY DIAG:  Abnormal posture  Muscle weakness (generalized)  Stiffness of right shoulder, not elsewhere classified  Chronic right shoulder pain  Rationale for Evaluation and Treatment Rehabilitation  PERTINENT HISTORY: 8 year history of R shoulder pain  PRECAUTIONS: none  SUBJECTIVE: Pt arriving today reporting 6/10 pain in her Rt shoulder  PAIN:  Are you having pain? Yes: NPRS scale: 6/10 Pain location: Rt anterior shoulder, pectoralis Pain description: sharp, achy Aggravating factors: crocheting after 20 minutes Relieving factors: resting, paid meds   OBJECTIVE: (objective measures completed at initial evaluation unless otherwise dated)  DIAGNOSTIC FINDINGS:  IMPRESSION: 1. Small central disc protrusion at C4-5 without significant stenosis or cord impingement. 2. Shallow right paracentral disc protrusion at C5-6 without significant stenosis or cord impingement. 3. Otherwise unremarkable MRI of the  cervical spine.   IMPRESSION: 1. No evidence of labral tear. Rounded contour of the posterior labrum, which could be a variant anatomy or mild degenerative changes.   2.  Rotator cuff tendons are intact.   3.  No evidence of fracture or osteonecrosis. PATIENT SURVEYS:  FOTO 55 (Goal 68)   COGNITION:           Overall cognitive status: Within functional limits for tasks assessed                                  SENSATION: Sema notes occasional R hand tingling   POSTURE: IR and protracted shoulders   UPPER EXTREMITY ROM:    Active neck, passive shoulder ROM Right eval Left eval  Shoulder flexion 130 165  Shoulder extension      Shoulder abduction      Shoulder horizontal adduction 30 40  Shoulder internal rotation 65 75  Shoulder external rotation 90 80  Elbow flexion      Cervical extension 55    Cervical lateral flexion 35 30  Cervical rotation  60 60  Wrist ulnar deviation      Wrist radial deviation      Wrist pronation      Wrist supination      (Blank rows = not tested)   UPPER EXTREMITY Strength:   Hand held dynamometer in pounds Right eval Left eval  Cervical lateral flexion 12.1 15.3  Cervical extension 21.7     Shoulder abduction      Shoulder adduction      Shoulder internal rotation 29.3 30.0  Shoulder external rotation 11.4 20.2  Middle trapezius      Lower trapezius      Elbow flexion      Elbow extension      Wrist flexion      Wrist extension      Wrist ulnar deviation      Wrist radial deviation      Wrist pronation      Wrist supination      Grip strength (lbs)      (Blank rows = not tested)               TODAY'S TREATMENT:  06/06/2022: TherEx: Supine AAROM Flexion:  1# bar: 2 x 10  Supine AAROM  ER: 1 # bar  2 x 10 Supine shoulder isometrics: flexion, extension, ER and IR all x 10 holding 5 seconds Pulleys: 2 minutes flexion Door stretch: 90 degrees x 3 holding 20 secs Cervical retraction x 5 holding 5 sec Serratus  punches 1 # 2 x 10 Manual:  Grade 2-3 AP GH mobs IASTM to Rt shoulder using biofreeze/US gel   06/03/2022 Exercise: Shoulder blade pinches 5X 5 seconds Cervical extension Isometrics 5X 5 seconds Shoulder ER theraband 10X 3 seconds slow eccentrics Red   Functional Activities: Long discussion regarding exam findings, shoulder and cervical spine anatomy and how they may relate to her R UE, scapular and hand symptoms   PATIENT EDUCATION: Education details: See above Person educated: Patient Education method: Explanation, Demonstration, Tactile cues, Verbal cues, and Handouts Education comprehension: verbalized understanding, returned demonstration, verbal cues required, tactile cues required, and needs further education     HOME EXERCISE PROGRAM: Access Code: [email or web address] URL: [email or web address] Date: 06/03/2022 Prepared by: Pauletta Brownsobert Lovell   Exercises - Standing Scapular Retraction  - 5 x daily - 7 x weekly - 1 sets - 5 reps - 5 second hold - Standing Isometric Cervical Extension with Manual Resistance  - 5 x daily - 7 x weekly - 1 sets - 5 reps - 5 hold - Shoulder External Rotation with Anchored Resistance  - 2 x daily - 7 x weekly - 1-2 sets - 10 reps - 3 hold   ASSESSMENT:   CLINICAL IMPRESSION: Pt with limited tolerance to active and antigravity exercises this visit and reported increased muscle fatigue/pain. Pt able to tolerate AAROM for her Rt shoulder as well as IASTM. We discussed TPDN as an option at a later therapy visit. Continue with skilled PT to maximize pt's functional mobility.      OBJECTIVE IMPAIRMENTS decreased activity tolerance, decreased endurance, decreased knowledge of condition, decreased ROM, decreased strength, decreased safety awareness, impaired perceived functional ability, impaired flexibility, impaired UE functional use, postural dysfunction, and pain.    ACTIVITY LIMITATIONS carrying, lifting, sleeping, and caring for others    PARTICIPATION LIMITATIONS: community activity, occupation, and crochet   PERSONAL FACTORS  No personal factors  are also affecting patient's functional outcome.    REHAB POTENTIAL: Good   CLINICAL DECISION MAKING: Evolving/moderate complexity   EVALUATION COMPLEXITY: Moderate     GOALS: Goals reviewed with patient? Yes   SHORT TERM GOALS: Target date: 07/01/2022  (Remove Blue Hyperlink)   [name] will improve R shoulder flexion to 170 degrees and horizontal adduction to 40 degrees. Baseline: 130 and 30 respectively Goal status: INITIAL   2.  [name] will be  independent with her initial (1st 2 visit) HEP. Baseline: Started 06/03/2022 Goal status: INITIAL     LONG TERM GOALS: Target date: 07/29/2022  (Remove Blue Hyperlink)   Improve FOTO to 68 in 9 visits. Baseline: 55 Goal status: INITIAL   2.  Ileta will report R UE and scapular pain consistently 0-3/10 on the Numeric Pain Rating Scale with no complaints of peripheral symptoms. Baseline: R UE, hand and scapular symptoms can be 7/10 Goal status: INITIAL   3.  Improve cervical extension strength to at least 35 pounds and lateral bending to at least 22 pounds. Baseline: 21 and 15/12 pounds respectively Goal status: INITIAL   4.  Improve R shoulder strength for IR to 36 and ER to 24 pounds. Baseline: 29 and 11 respectively Goal status: INITIAL   5.  Improve cervical extension AROM to 65 degrees and lateral bending to 35/35. Baseline: 55 and 30/35 respectively Goal status: INITIAL   6.  Cambrey will be independent with her long term HEP. Baseline: Started 06/03/2022 Goal status: INITIAL     PLAN: PT FREQUENCY: 1-2x/week   PT DURATION: 8 weeks   PLANNED INTERVENTIONS: Therapeutic exercises, Therapeutic activity, Neuromuscular re-education, Patient/Family education, Joint mobilization, Cryotherapy, Traction, and Manual therapy   PLAN FOR NEXT SESSION: Review HEP, progress scapular and RTC strength, postural strength.   Practical posture and body mechanics review and add as needed.      Sharmon Leyden, PT, MPT 06/06/2022, 2:51 PM

## 2022-06-16 ENCOUNTER — Encounter: Payer: 59 | Admitting: Rehabilitative and Restorative Service Providers"

## 2022-06-23 ENCOUNTER — Ambulatory Visit: Payer: 59 | Admitting: Rehabilitative and Restorative Service Providers"

## 2022-06-23 ENCOUNTER — Encounter: Payer: Self-pay | Admitting: Rehabilitative and Restorative Service Providers"

## 2022-06-23 DIAGNOSIS — M25611 Stiffness of right shoulder, not elsewhere classified: Secondary | ICD-10-CM

## 2022-06-23 DIAGNOSIS — G8929 Other chronic pain: Secondary | ICD-10-CM

## 2022-06-23 DIAGNOSIS — M6281 Muscle weakness (generalized): Secondary | ICD-10-CM

## 2022-06-23 DIAGNOSIS — M25511 Pain in right shoulder: Secondary | ICD-10-CM

## 2022-06-23 DIAGNOSIS — R293 Abnormal posture: Secondary | ICD-10-CM

## 2022-06-23 NOTE — Therapy (Signed)
OUTPATIENT PHYSICAL THERAPY TREATMENT NOTE   Patient Name: Leslie [name] MRN: [id number] DOB:03/21/2001, 21 y.o., female Today's Date: 06/23/2022  PCP: Stephenie Acres, FNP REFERRING PROVIDER: Meredith Pel, MD  END OF SESSION:   PT End of Session - 06/23/22 1444     Visit Number 3    Number of Visits 16    PT Start Time 4332    PT Stop Time 9518    PT Time Calculation (min) 43 min    Activity Tolerance Patient tolerated treatment well;No increased pain    Behavior During Therapy WFL for tasks assessed/performed              Past Medical History:  Diagnosis Date   ADHD (attention deficit hyperactivity disorder)    Anxiety    Past Surgical History:  Procedure Laterality Date   MYRINGOTOMY WITH TUBE PLACEMENT     TONSILLECTOMY     tubes     wisdom teeth     Patient Active Problem List   Diagnosis Date Noted   Vitamin B12 deficiency 05/10/2018   B12 deficiency 02/08/2018    REFERRING DIAG:  M79.601 (ICD-10-CM) - Right arm pain   THERAPY DIAG:  Abnormal posture  Muscle weakness (generalized)  Stiffness of right shoulder, not elsewhere classified  Chronic right shoulder pain  Rationale for Evaluation and Treatment Rehabilitation  PERTINENT HISTORY: 8 year history of R shoulder pain  PRECAUTIONS: none  SUBJECTIVE: Leslie [name] reports good HEP compliance.  She notes feeling stronger as compared to evaluation.  Endurance with crochet work has improved since starting PT 20 days ago.  PAIN:  Are you having pain? Yes: NPRS scale: 1-6/10 this week on a 0-10/10 Pain location: Rt anterior shoulder, scapular, R sided neck, pectoralis Pain description: achy, tingling to the R hand Aggravating factors: crocheting after 30 (was 20) minutes Relieving factors: resting, pain meds   OBJECTIVE: (objective measures completed at initial evaluation unless otherwise dated)  DIAGNOSTIC FINDINGS:  IMPRESSION: 1. Small central disc protrusion at C4-5 without  significant stenosis or cord impingement. 2. Shallow right paracentral disc protrusion at C5-6 without significant stenosis or cord impingement. 3. Otherwise unremarkable MRI of the cervical spine.   IMPRESSION: 1. No evidence of labral tear. Rounded contour of the posterior labrum, which could be a variant anatomy or mild degenerative changes.   2.  Rotator cuff tendons are intact.   3.  No evidence of fracture or osteonecrosis. PATIENT SURVEYS:  FOTO 55 (Goal 68)   COGNITION:           Overall cognitive status: Within functional limits for tasks assessed                                  SENSATION: Gal notes occasional R hand tingling   POSTURE: IR and protracted shoulders   UPPER EXTREMITY ROM:    Active neck, passive shoulder ROM Right eval Left eval Left in degrees  Shoulder flexion 130 165 130  Shoulder extension       Shoulder abduction       Shoulder horizontal adduction 30 40 40  Shoulder internal rotation 65 75 70  Shoulder external rotation 90 80 90  Elbow flexion       Cervical extension 55     Cervical lateral flexion 35 30   Cervical rotation  60 60   Wrist ulnar deviation       Wrist radial deviation  Wrist pronation       Wrist supination       (Blank rows = not tested)   UPPER EXTREMITY Strength:   Hand held dynamometer in pounds Right eval Left eval Left in pounds  Cervical lateral flexion 12.1 15.3 22.2/14.9  Cervical extension 21.7    24.7  Shoulder abduction       Shoulder adduction       Shoulder internal rotation 29.3 30.0 29.3  Shoulder external rotation 11.4 20.2 17.1  Middle trapezius       Lower trapezius       Elbow flexion       Elbow extension       Wrist flexion       Wrist extension       Wrist ulnar deviation       Wrist radial deviation       Wrist pronation       Wrist supination       Grip strength (lbs)       (Blank rows = not tested)               TODAY'S TREATMENT:  06/23/2022: Shoulder blade  pinches 10X 5 seconds Cervical extension Isometrics 10X 5 seconds Cervical lateral bending Isometrics 5X 5 seconds B Shoulder ER theraband Red and Green 10X each 3 seconds slow eccentrics Supine arm raises 10X each with 2, 3 and 4# weights (reach only, palm in, 3 second hold) Pull to chest (Row) Blue 20X 3 seconds  Cervical traction 5 minutes at 25# static (for occasional hand symptoms)   06/06/2022: TherEx: Supine AAROM Flexion:  1# bar: 2 x 10  Supine AAROM  ER: 1 # bar  2 x 10 Supine shoulder isometrics: flexion, extension, ER and IR all x 10 holding 5 seconds Pulleys: 2 minutes flexion Door stretch: 90 degrees x 3 holding 20 secs Cervical retraction x 5 holding 5 sec Serratus punches 1 # 2 x 10 Manual:  Grade 2-3 AP GH mobs IASTM to Rt shoulder using biofreeze/US gel    06/03/2022 Exercise: Shoulder blade pinches 5X 5 seconds Cervical extension Isometrics 5X 5 seconds Shoulder ER theraband 10X 3 seconds slow eccentrics Red   Functional Activities: Long discussion regarding exam findings, shoulder and cervical spine anatomy and how they may relate to her R UE, scapular and hand symptoms   PATIENT EDUCATION: Education details: See above Person educated: Patient Education method: Explanation, Demonstration, Tactile cues, Verbal cues, and Handouts Education comprehension: verbalized understanding, returned demonstration, verbal cues required, tactile cues required, and needs further education     HOME EXERCISE PROGRAM: Access Code: [email or web address] URL: [email or web address] Date: 06/23/2022 Prepared by: Vista Mink  Exercises - Standing Scapular Retraction  - 5 x daily - 7 x weekly - 1 sets - 5 reps - 5 second hold - Standing Isometric Cervical Extension with Manual Resistance  - 5 x daily - 7 x weekly - 1 sets - 5 reps - 5 hold - Shoulder External Rotation with Anchored Resistance  - 2 x daily - 7 x weekly - 1-2 sets - 10 reps - 3 hold - Standing Row with  Anchored Resistance Band with PLB  - 1 x daily - 7 x weekly - 1-2 sets - 20 reps - 3 seconds hold - Standing Isometric Cervical Sidebending with Manual Resistance  - 3-5 x daily - 7 x weekly - 1 sets - 5 reps - 5 hold - Supine Scapular Protraction in Flexion with Dumbbells  -  2-3 x daily - 7 x weekly - 2 sets - 20 reps - 3 seconds hold   ASSESSMENT:   CLINICAL IMPRESSION: Adenike is making early objective progress with her cervical and shoulder strength.  Added scapular protraction to assist with active shoulder flexion.  Also started cervical traction to address occasional hand tingling.  Layia has both cervical and shoulder signs and will benefit from the recommended POC to meet LTGs.     OBJECTIVE IMPAIRMENTS decreased activity tolerance, decreased endurance, decreased knowledge of condition, decreased ROM, decreased strength, decreased safety awareness, impaired perceived functional ability, impaired flexibility, impaired UE functional use, postural dysfunction, and pain.    ACTIVITY LIMITATIONS carrying, lifting, sleeping, and caring for others   PARTICIPATION LIMITATIONS: community activity, occupation, and crochet   PERSONAL FACTORS  No personal factors  are also affecting patient's functional outcome.    REHAB POTENTIAL: Good   CLINICAL DECISION MAKING: Evolving/moderate complexity   EVALUATION COMPLEXITY: Moderate     GOALS: Goals reviewed with patient? Yes   SHORT TERM GOALS: Target date: 07/01/2022  (Remove Blue Hyperlink)   Glynda will improve R shoulder flexion to 170 degrees and horizontal adduction to 40 degrees. Baseline: 130 and 30 respectively Goal status: INITIAL   2.  Brendalee will be independent with her initial (1st 2 visit) HEP. Baseline: Started 06/03/2022 Goal status:  Met 06/23/2022     LONG TERM GOALS: Target date: 07/29/2022  (Remove Blue Hyperlink)   Improve FOTO to 68 in 9 visits. Baseline: 55 Goal status: INITIAL   2.  Destani will report R UE  and scapular pain consistently 0-3/10 on the Numeric Pain Rating Scale with no complaints of peripheral symptoms. Baseline: R UE, hand and scapular symptoms can be 7/10 Goal status: INITIAL   3.  Improve cervical extension strength to at least 35 pounds and lateral bending to at least 22 pounds. Baseline: 21 and 15/12 pounds respectively Goal status: On Going 06/23/2022   4.  Improve R shoulder strength for IR to 36 and ER to 24 pounds. Baseline: 29 and 11 respectively Goal status: On Going 06/23/2022   5.  Improve cervical extension AROM to 65 degrees and lateral bending to 35/35. Baseline: 55 and 30/35 respectively Goal status: INITIAL   6.  Tannis will be independent with her long term HEP. Baseline: Started 06/03/2022 Goal status: INITIAL     PLAN: PT FREQUENCY: 1-2x/week   PT DURATION: 8 weeks   PLANNED INTERVENTIONS: Therapeutic exercises, Therapeutic activity, Neuromuscular re-education, Patient/Family education, Joint mobilization, Cryotherapy, Traction, and Manual therapy   PLAN FOR NEXT SESSION: Review current HEP, progress scapular and RTC strength, postural and cervical strength.  Practical posture and body mechanics review and cervical traction at 25#.      Farley Ly, PT, MPT 06/23/2022, 2:49 PM

## 2022-06-27 ENCOUNTER — Telehealth: Payer: Self-pay | Admitting: Physical Therapy

## 2022-06-27 ENCOUNTER — Encounter: Payer: 59 | Admitting: Physical Therapy

## 2022-06-27 NOTE — Telephone Encounter (Signed)
I called pt after she missed her 2:30 pm PT appointment today. Pt has no more appointments scheduled so I left a message for the pt to call [phone] to make any additional appointments.  Narda Amber, PT, MPT 06/27/22 3:09 PM

## 2022-07-06 ENCOUNTER — Ambulatory Visit (INDEPENDENT_AMBULATORY_CARE_PROVIDER_SITE_OTHER): Payer: 59 | Admitting: Nurse Practitioner

## 2022-07-06 VITALS — BP 110/64 | HR 88 | Ht 60.0 in | Wt 193.0 lb

## 2022-07-06 DIAGNOSIS — Z0000 Encounter for general adult medical examination without abnormal findings: Secondary | ICD-10-CM

## 2022-07-06 DIAGNOSIS — Z3041 Encounter for surveillance of contraceptive pills: Secondary | ICD-10-CM | POA: Diagnosis not present

## 2022-07-06 DIAGNOSIS — Z01419 Encounter for gynecological examination (general) (routine) without abnormal findings: Secondary | ICD-10-CM | POA: Diagnosis not present

## 2022-07-06 NOTE — Progress Notes (Signed)
   Leslie [name] Dec 05, 2001 [id number]   History:  21 y.o. G0 presents for annual exam without GYN complaints. Monthly cycle/OCPs. Dermatology switched her to Yasmin for acne management. Doing well on this. Normal pap history. Anxiety, ADHD managed by PCP. Gardasil series completed.   Gynecologic History Patient's last menstrual period was 07/01/2022. Period Cycle (Days): 28 Period Duration (Days): 5 Period Pattern: Regular Menstrual Flow: Light Menstrual Control: Tampon Menstrual Control Change Freq (Hours): 4 Dysmenorrhea: (!) Moderate Dysmenorrhea Symptoms: Cramping Contraception/Family planning: OCP (estrogen/progesterone) Sexually active: not currently   Health Maintenance Last Pap: 07/05/2020. Results were: Normal Last mammogram: Not indicated Last colonoscopy: Not indicated Last Dexa: Not indicated   Past medical history, past surgical history, family history and social history were all reviewed and documented in the EPIC chart. 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   ROS:  A ROS was performed and pertinent positives and negatives are included.  Exam:  Vitals:   07/06/22 0935  BP: 110/64  Pulse: 88  SpO2: 99%  Weight: 193 lb (87.5 kg)  Height: 5' (1.524 m)    Body mass index is 37.69 kg/m.  General appearance:  Normal Thyroid:  Symmetrical, normal in size, without palpable masses or nodularity. Respiratory  Auscultation:  Clear without wheezing or rhonchi Cardiovascular  Auscultation:  Regular rate, without rubs, murmurs or gallops  Edema/varicosities:  Not grossly evident Abdominal  Soft,nontender, without masses, guarding or rebound.  Liver/spleen:  No organomegaly noted  Hernia:  None appreciated  Skin  Inspection:  Grossly normal Breasts: Not indicated per guidelines Genitourinary Not indicated   Assessment/Plan:  21 y.o. G0 for annual exam.   Well female exam with routine gynecological exam - Education provided on  SBEs, importance of preventative screenings, current guidelines, high calcium diet, regular exercise, and multivitamin daily. Labs with PCP.   Encounter for surveillance of contraceptive pills - Dermatology changed to Yasmin for acne. Good management. Taking as prescribed. Does not need refills at this time.   Screening for cervical cancer - Normal pap history. Will repeat at 3-year interval per guidelines.   Return in 1 year for annual.    Leslie Mackie DNP, 9:55 AM 07/06/2022

## 2022-07-08 DIAGNOSIS — Z23 Encounter for immunization: Secondary | ICD-10-CM | POA: Diagnosis not present

## 2022-07-14 DIAGNOSIS — Z0184 Encounter for antibody response examination: Secondary | ICD-10-CM | POA: Diagnosis not present

## 2022-08-03 ENCOUNTER — Other Ambulatory Visit (HOSPITAL_COMMUNITY): Payer: Self-pay

## 2022-08-15 ENCOUNTER — Other Ambulatory Visit (HOSPITAL_COMMUNITY): Payer: Self-pay

## 2022-08-15 DIAGNOSIS — F419 Anxiety disorder, unspecified: Secondary | ICD-10-CM | POA: Diagnosis not present

## 2022-08-15 DIAGNOSIS — F902 Attention-deficit hyperactivity disorder, combined type: Secondary | ICD-10-CM | POA: Diagnosis not present

## 2022-08-15 MED ORDER — VYVANSE 30 MG PO CAPS
30.0000 mg | ORAL_CAPSULE | Freq: Every morning | ORAL | 0 refills | Status: DC
  Filled 2022-08-15: qty 90, 90d supply, fill #0

## 2022-08-16 ENCOUNTER — Encounter: Payer: Self-pay | Admitting: Physical Therapy

## 2022-08-16 ENCOUNTER — Ambulatory Visit: Payer: 59 | Admitting: Physical Therapy

## 2022-08-16 DIAGNOSIS — G8929 Other chronic pain: Secondary | ICD-10-CM

## 2022-08-16 DIAGNOSIS — M25511 Pain in right shoulder: Secondary | ICD-10-CM | POA: Diagnosis not present

## 2022-08-16 DIAGNOSIS — M6281 Muscle weakness (generalized): Secondary | ICD-10-CM | POA: Diagnosis not present

## 2022-08-16 DIAGNOSIS — M25611 Stiffness of right shoulder, not elsewhere classified: Secondary | ICD-10-CM

## 2022-08-16 DIAGNOSIS — R293 Abnormal posture: Secondary | ICD-10-CM | POA: Diagnosis not present

## 2022-08-16 NOTE — Therapy (Addendum)
OUTPATIENT PHYSICAL THERAPY TREATMENT/DISCHARGE NOTE    Patient Name: Leslie [name] MRN: [id number] DOB:2001-06-13, 21 y.o., female Today's Date: 08/16/2022  PCP: Stephenie Acres, FNP REFERRING PROVIDER: Meredith Pel, MD  END OF SESSION:   PT End of Session - 08/16/22 1128     Visit Number 4    Number of Visits 12    Date for PT Re-Evaluation 10/14/22    Authorization Type 1x/ week for 8 weeks, recert sent on 04/19/94    PT Start Time 0801    PT Stop Time 0845    PT Time Calculation (min) 44 min    Activity Tolerance Patient tolerated treatment well;No increased pain    Behavior During Therapy WFL for tasks assessed/performed            PHYSICAL THERAPY DISCHARGE SUMMARY  Visits from Start of Care: 4  Current functional level related to goals / functional outcomes: See note   Remaining deficits: See note   Education / Equipment: Updated HEP   Patient agrees to discharge. Patient goals were partially met. Patient is being discharged due to not returning since the last visit.    Past Medical History:  Diagnosis Date   ADHD (attention deficit hyperactivity disorder)    Anxiety    Past Surgical History:  Procedure Laterality Date   MYRINGOTOMY WITH TUBE PLACEMENT     TONSILLECTOMY     tubes     wisdom teeth     Patient Active Problem List   Diagnosis Date Noted   Vitamin B12 deficiency 05/10/2018   B12 deficiency 02/08/2018    REFERRING DIAG:  M79.601 (ICD-10-CM) - Right arm pain   THERAPY DIAG:  Abnormal posture  Muscle weakness (generalized)  Stiffness of right shoulder, not elsewhere classified  Chronic right shoulder pain  Rationale for Evaluation and Treatment Rehabilitation  PERTINENT HISTORY: 8 year history of R shoulder pain  PRECAUTIONS: none  SUBJECTIVE:Pt arriving reporting good response to therapy interventions. Pt reporting less overall pain in her Rt shoulder.   PAIN:  Are you having pain? Yes: NPRS scale:  2-3/10 this week pain can vary from 0-5/10 Pain location: Rt anterior shoulder, scapular, R sided neck, pectoralis Pain description: achy, tingling to the R hand Aggravating factors: crocheting after 30 (was 20) minutes Relieving factors: resting, pain meds   OBJECTIVE: (objective measures completed at initial evaluation unless otherwise dated)  DIAGNOSTIC FINDINGS:  IMPRESSION: 1. Small central disc protrusion at C4-5 without significant stenosis or cord impingement. 2. Shallow right paracentral disc protrusion at C5-6 without significant stenosis or cord impingement. 3. Otherwise unremarkable MRI of the cervical spine.   IMPRESSION: 1. No evidence of labral tear. Rounded contour of the posterior labrum, which could be a variant anatomy or mild degenerative changes.   2.  Rotator cuff tendons are intact.   3.  No evidence of fracture or osteonecrosis. PATIENT SURVEYS:  FOTO 55 (Goal 68)   COGNITION:           Overall cognitive status: Within functional limits for tasks assessed                                  SENSATION: Kairie notes occasional R hand tingling   POSTURE: IR and protracted shoulders   UPPER EXTREMITY ROM:    Active neck, passive shoulder ROM Right eval Left eval Left in degrees Rt  08/16/22 Left 08/16/22  Shoulder flexion 130 165  130 A: 140 P: 144   Shoulder extension         Shoulder abduction      A: 146 P: 155   Shoulder horizontal adduction 30 40 40    Shoulder internal rotation 65 75 70    Shoulder external rotation 90 80 90 A: 58 c shoulder abd 45 degrees P: 70   Elbow flexion         Cervical extension 55       Cervical lateral flexion 35 _0 Cervical rotation  60 60  60 62  Wrist ulnar deviation         Wrist radial deviation         Wrist pronation         Wrist supination         (Blank rows = not tested)   UPPER EXTREMITY Strength:   Hand held dynamometer in pounds Right eval Left eval Left in pounds Rt 08/16/22 Left   08/16/22   Cervical lateral flexion 12.1 15.3 22.2/14.9 14.2 14.2  Cervical extension 21.7    24.7 18.5   Shoulder abduction         Shoulder adduction         Shoulder internal rotation 29.3 30.0 29.3 25.9 43.3  Shoulder external rotation 11.4 20.2 17.1 16.5 24.5  Middle trapezius         Lower trapezius         Elbow flexion         Elbow extension         Wrist flexion         Wrist extension         Wrist ulnar deviation         Wrist radial deviation         Wrist pronation         Wrist supination         Grip strength (lbs)         (Blank rows = not tested)               TODAY'S TREATMENT:  08/16/2022: TherEx: Rows : level 4 band x 20 holding 3 sec Standing with back against red physioball  against the wall for core activation with bil shoulder flexion using 3 # bar x 20 Pulleys: 2 minutes flexion Door stretch: 90 degrees x 3 holding 20 secs  Manual:  Grade 2-3 AP GH mobs STM to Rt shoulder and thoracic spine Trigger Point Dry-Needling  Treatment instructions: Expect mild to moderate muscle soreness. S/S of pneumothorax if dry needled over a lung field, and to seek immediate medical attention should they occur. Patient verbalized understanding of these instructions and education. Patient Consent Given: Yes Education handout provided: Yes Muscles treated: Rt upper trap, Rt middle deltoid, Rt infraspinatus, Rt cervical paraspinals Treatment response/outcome: multiple twitch responses noted    06/23/2022: Shoulder blade pinches 10X 5 seconds Cervical extension Isometrics 10X 5 seconds Cervical lateral bending Isometrics 5X 5 seconds B Shoulder ER theraband Red and Green 10X each 3 seconds slow eccentrics Supine arm raises 10X each with 2, 3 and 4# weights (reach only, palm in, 3 second hold) Pull to chest (Row) Blue 20X 3 seconds  Cervical traction 5 minutes at 25# static (for occasional hand symptoms)   06/06/2022: TherEx: Supine AAROM Flexion:  1# bar: 2 x  10  Supine AAROM  ER: 1 # bar  2 x 10 Supine shoulder isometrics:  flexion, extension, ER and IR all x 10 holding 5 seconds Pulleys: 2 minutes flexion Door stretch: 90 degrees x 3 holding 20 secs Cervical retraction x 5 holding 5 sec Serratus punches 1 # 2 x 10 Manual:  Grade 2-3 AP GH mobs IASTM to Rt shoulder using biofreeze/US gel       PATIENT EDUCATION: Education details: See above Person educated: Patient Education method: Explanation, Demonstration, Tactile cues, Verbal cues, and Handouts Education comprehension: verbalized understanding, returned demonstration, verbal cues required, tactile cues required, and needs further education     HOME EXERCISE PROGRAM: Access Code: [email or web address] URL: [email or web address] Date: 06/23/2022 Prepared by: Vista Mink  Exercises - Standing Scapular Retraction  - 5 x daily - 7 x weekly - 1 sets - 5 reps - 5 second hold - Standing Isometric Cervical Extension with Manual Resistance  - 5 x daily - 7 x weekly - 1 sets - 5 reps - 5 hold - Shoulder External Rotation with Anchored Resistance  - 2 x daily - 7 x weekly - 1-2 sets - 10 reps - 3 hold - Standing Row with Anchored Resistance Band with PLB  - 1 x daily - 7 x weekly - 1-2 sets - 20 reps - 3 seconds hold - Standing Isometric Cervical Sidebending with Manual Resistance  - 3-5 x daily - 7 x weekly - 1 sets - 5 reps - 5 hold - Supine Scapular Protraction in Flexion with Dumbbells  - 2-3 x daily - 7 x weekly - 2 sets - 20 reps - 3 seconds hold   ASSESSMENT:   CLINICAL IMPRESSION: Pt has not been seen due to a family emergency in over 1 month. Pt returning to therapy reporting she feels like her overall functional mobility has improved but is still limited with right shoulder flexion and abduction. Pt also still reporting pain with ADL's and work related activities. 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  I am requesting 1x/ week  for 8 more weeks for pt to progress toward all goals not met.      OBJECTIVE IMPAIRMENTS decreased activity tolerance, decreased endurance, decreased knowledge of condition, decreased ROM, decreased strength, decreased safety awareness, impaired perceived functional ability, impaired flexibility, impaired UE functional use, postural dysfunction, and pain.    ACTIVITY LIMITATIONS carrying, lifting, sleeping, and caring for others   PARTICIPATION LIMITATIONS: community activity, occupation, and crochet   PERSONAL FACTORS  No personal factors  are also affecting patient's functional outcome.    REHAB POTENTIAL: Good   CLINICAL DECISION MAKING: Evolving/moderate complexity   EVALUATION COMPLEXITY: Moderate     GOALS: Goals reviewed with patient? Yes   SHORT TERM GOALS: Target date: 09/09/2022     Leslie [name] will improve R shoulder flexion to 170 degrees and horizontal adduction to 40 degrees. Baseline: 130 and 30 respectively Goal status: INITIAL   2.  Leslie [name] will be independent with her initial (1st 2 visit) HEP. Baseline: Started 06/03/2022 Goal status:  Met 06/23/2022     LONG TERM GOALS: Target date: 10/14/2022     Improve FOTO to 68 in 9 visits. Baseline: 55 Goal status: on-going: 08/16/22  2.  Leslie [name] will report R UE and scapular pain consistently 0-3/10 on the Numeric Pain Rating Scale with no complaints of peripheral symptoms. Baseline: R UE, hand and scapular symptoms can be 7/10 Goal status: on-going: 08/16/22   3.  Improve cervical extension strength to at least 35 pounds and lateral  bending to at least 22 pounds. Baseline: 21 and 15/12 pounds respectively Goal status: On Going 08/16/2022   4.  Improve R shoulder strength for IR to 36 and ER to 24 pounds. Baseline: 29 and 11 respectively Goal status: On Going 08/16/2022   5.  Improve cervical extension AROM to 65 degrees and lateral bending to 35/35. Baseline: 55 and 30/35 respectively Goal status: on-going:  08/16/22   6.  Daishia will be independent with her long term HEP. Baseline: Started 06/03/2022 Goal status: On going: 08/16/22     PLAN: PT FREQUENCY: 1x/week   PT DURATION: 8 weeks   PLANNED INTERVENTIONS: Therapeutic exercises, Therapeutic activity, Neuromuscular re-education, Patient/Family education, Joint mobilization, Cryotherapy, Traction, and Manual therapy/TPDN   PLAN FOR NEXT SESSION: assess response to DN,  progress scapular and RTC strength, postural and cervical strength.  Practical posture and body mechanics review and cervical traction at 25#.      Leslie [name], PT, MPT 08/16/2022, 1:54 PM  Farley Ly PT, MPT

## 2022-08-23 ENCOUNTER — Other Ambulatory Visit (HOSPITAL_COMMUNITY): Payer: Self-pay

## 2022-08-23 ENCOUNTER — Telehealth: Payer: Self-pay | Admitting: Physical Therapy

## 2022-08-23 ENCOUNTER — Encounter: Payer: 59 | Admitting: Physical Therapy

## 2022-08-23 NOTE — Telephone Encounter (Signed)
I called to f/u with pt about missing her PT appointment this morning at 8:45am. I left a message for pt to follow up to schedule any further appointments at [phone].   Narda Amber, PT, MPT 08/23/22 9:08 AM

## 2022-09-13 ENCOUNTER — Encounter: Payer: 59 | Admitting: Physical Therapy

## 2022-09-19 ENCOUNTER — Encounter: Payer: 59 | Admitting: Physical Therapy

## 2022-09-19 ENCOUNTER — Telehealth: Payer: Self-pay | Admitting: Physical Therapy

## 2022-09-19 NOTE — Telephone Encounter (Signed)
I spoke to pt this morning about needing a new referral for PT before she was able to be seen by provider. Pt in agreement and said she could reach back out to Dr. Marlou Sa if needed.  Kearney Hard, PT, MPT 09/19/22 7:38 AM

## 2022-10-06 ENCOUNTER — Other Ambulatory Visit (HOSPITAL_COMMUNITY): Payer: Self-pay

## 2022-10-12 ENCOUNTER — Ambulatory Visit: Payer: 59 | Admitting: Nurse Practitioner

## 2022-10-12 ENCOUNTER — Encounter: Payer: Self-pay | Admitting: Nurse Practitioner

## 2022-10-12 VITALS — BP 110/72 | HR 99

## 2022-10-12 DIAGNOSIS — R1031 Right lower quadrant pain: Secondary | ICD-10-CM

## 2022-10-12 LAB — CBC WITH DIFFERENTIAL/PLATELET
Absolute Monocytes: 655 cells/uL (ref 200–950)
Basophils Absolute: 102 cells/uL (ref 0–200)
Basophils Relative: 0.9 %
Eosinophils Absolute: 328 cells/uL (ref 15–500)
Eosinophils Relative: 2.9 %
HCT: 42.1 % (ref 35.0–45.0)
Hemoglobin: 14.3 g/dL (ref 11.7–15.5)
Lymphs Abs: 3334 cells/uL (ref 850–3900)
MCH: 29.4 pg (ref 27.0–33.0)
MCHC: 34 g/dL (ref 32.0–36.0)
MCV: 86.6 fL (ref 80.0–100.0)
MPV: 10.3 fL (ref 7.5–12.5)
Monocytes Relative: 5.8 %
Neutro Abs: 6882 cells/uL (ref 1500–7800)
Neutrophils Relative %: 60.9 %
Platelets: 322 10*3/uL (ref 140–400)
RBC: 4.86 10*6/uL (ref 3.80–5.10)
RDW: 13 % (ref 11.0–15.0)
Total Lymphocyte: 29.5 %
WBC: 11.3 10*3/uL — ABNORMAL HIGH (ref 3.8–10.8)

## 2022-10-12 NOTE — Progress Notes (Signed)
   Acute Office Visit  Subjective:    Patient ID: Leslie [name], female    DOB: Jan 12, 2001, 21 y.o.   MRN: [id number]   HPI 21 y.o. presents today for RLQ abdominal pain. Two days ago she began having mild abdominal pain during the day and around 7 pm she had severe pain that caused her to double over and she had to lay down for a while. Pain started to subside around 11 pm and has been mild since then. She describes the pain as sharp/knife-like. No bleeding. Not sexually active. Denies changes in bowels, no nausea or vomiting. Denies vaginal or urinary symptoms.    Review of Systems  Constitutional: Negative.   Gastrointestinal:  Positive for abdominal pain (RLQ). Negative for abdominal distention, constipation, diarrhea, nausea and vomiting.  Genitourinary:  Negative for dysuria, flank pain, frequency, hematuria, menstrual problem, urgency and vaginal bleeding.       Objective:    Physical Exam Constitutional:      Appearance: Normal appearance.  Abdominal:     Tenderness: There is abdominal tenderness in the right lower quadrant.  Genitourinary:    General: Normal vulva.     Vagina: Normal.     Cervix: Normal.     Uterus: Normal.      Adnexa:        Right: Tenderness present.      BP 110/72   Pulse 99   LMP 09/28/2022 (Approximate)   SpO2 97%  Wt Readings from Last 3 Encounters:  07/06/22 193 lb (87.5 kg)  07/05/21 190 lb (86.2 kg)  05/21/20 183 lb (83 kg)        Patient informed chaperone available to be present for breast and/or pelvic exam. Patient has requested no chaperone to be present. Patient has been advised what will be completed during breast and pelvic exam.   Assessment & Plan:   Problem List Items Addressed This Visit   None Visit Diagnoses     Right lower quadrant pain    -  Primary   Relevant Orders   CBC with Differential/Platelet   US PELVIS TRANSVAGINAL NON-OB (TV ONLY)      Plan: Does not appear to be an acute abdomen. Will  check CBC today. Will schedule pelvic ultrasound. Ibuprofen/Tylenol and heating pad for pain. She is aware to go to ER if she experiences severe pain with nausea and/or vomiting.      Tamela Gammon DNP, 3:05 PM 10/12/2022

## 2022-10-13 ENCOUNTER — Encounter: Payer: Self-pay | Admitting: Nurse Practitioner

## 2022-10-13 DIAGNOSIS — R1031 Right lower quadrant pain: Secondary | ICD-10-CM

## 2022-10-13 NOTE — Telephone Encounter (Signed)
Please offer this time to her. If she experiences worsening pain, nausea, vomiting, or fever before then she needs to go to ER.

## 2022-10-13 NOTE — Telephone Encounter (Signed)
Can we see if [facility] has anything today or tomorrow for ultrasound of abdomen and pelvis?

## 2022-10-13 NOTE — Telephone Encounter (Signed)
I spoke with [facility]. First available appt to do both u/s together (abd/pelvis)) is Monday 10/17/22.

## 2022-10-13 NOTE — Telephone Encounter (Signed)
U/s scheduled [facility] 11/15/22 at 3:00pm.

## 2022-10-14 ENCOUNTER — Other Ambulatory Visit (HOSPITAL_COMMUNITY): Payer: Self-pay

## 2022-10-17 ENCOUNTER — Ambulatory Visit
Admission: RE | Admit: 2022-10-17 | Discharge: 2022-10-17 | Disposition: A | Discharge status: Home / Self Care | Payer: 59 | Source: Ambulatory Visit | Attending: Nurse Practitioner | Admitting: Nurse Practitioner

## 2022-10-17 DIAGNOSIS — N83201 Unspecified ovarian cyst, right side: Secondary | ICD-10-CM | POA: Diagnosis not present

## 2022-10-17 DIAGNOSIS — R1031 Right lower quadrant pain: Secondary | ICD-10-CM

## 2022-10-19 ENCOUNTER — Other Ambulatory Visit: Payer: Self-pay

## 2022-10-19 ENCOUNTER — Other Ambulatory Visit: Payer: 59

## 2022-10-19 ENCOUNTER — Encounter: Payer: Self-pay | Admitting: Nurse Practitioner

## 2022-10-19 DIAGNOSIS — R1031 Right lower quadrant pain: Secondary | ICD-10-CM

## 2022-10-19 DIAGNOSIS — D72828 Other elevated white blood cell count: Secondary | ICD-10-CM

## 2022-10-19 LAB — CBC WITH DIFFERENTIAL/PLATELET
Absolute Monocytes: 526 cells/uL (ref 200–950)
Basophils Absolute: 90 cells/uL (ref 0–200)
Basophils Relative: 0.8 %
Eosinophils Absolute: 336 cells/uL (ref 15–500)
Eosinophils Relative: 3 %
HCT: 40.5 % (ref 35.0–45.0)
Hemoglobin: 13.8 g/dL (ref 11.7–15.5)
Lymphs Abs: 2509 cells/uL (ref 850–3900)
MCH: 29.2 pg (ref 27.0–33.0)
MCHC: 34.1 g/dL (ref 32.0–36.0)
MCV: 85.8 fL (ref 80.0–100.0)
MPV: 9.8 fL (ref 7.5–12.5)
Monocytes Relative: 4.7 %
Neutro Abs: 7739 cells/uL (ref 1500–7800)
Neutrophils Relative %: 69.1 %
Platelets: 310 10*3/uL (ref 140–400)
RBC: 4.72 10*6/uL (ref 3.80–5.10)
RDW: 13 % (ref 11.0–15.0)
Total Lymphocyte: 22.4 %
WBC: 11.2 10*3/uL — ABNORMAL HIGH (ref 3.8–10.8)

## 2022-10-19 NOTE — Telephone Encounter (Signed)
Leslie [name]., NP came to my desk and asked me to place order for CT Abd/Pelvis with and without contrast for [facility]. Order placed and message sent to patient that they will be calling her to schedule or she can call to schedule and ph # provided.

## 2022-10-26 ENCOUNTER — Ambulatory Visit
Admission: RE | Admit: 2022-10-26 | Discharge: 2022-10-26 | Disposition: A | Discharge status: Home / Self Care | Payer: 59 | Source: Ambulatory Visit | Attending: Nurse Practitioner | Admitting: Nurse Practitioner

## 2022-10-26 DIAGNOSIS — R1031 Right lower quadrant pain: Secondary | ICD-10-CM

## 2022-10-26 DIAGNOSIS — K828 Other specified diseases of gallbladder: Secondary | ICD-10-CM | POA: Diagnosis not present

## 2022-10-26 MED ORDER — IOPAMIDOL (ISOVUE-370) INJECTION 76%
100.0000 mL | Freq: Once | INTRAVENOUS | Status: AC | PRN
  Administered 2022-10-26: 100 mL via INTRAVENOUS

## 2022-10-28 ENCOUNTER — Other Ambulatory Visit: Payer: Self-pay

## 2022-10-28 DIAGNOSIS — M542 Cervicalgia: Secondary | ICD-10-CM

## 2022-11-02 ENCOUNTER — Other Ambulatory Visit: Payer: 59

## 2022-11-02 ENCOUNTER — Other Ambulatory Visit: Payer: Self-pay | Admitting: Nurse Practitioner

## 2022-11-02 ENCOUNTER — Other Ambulatory Visit: Payer: Self-pay | Admitting: *Deleted

## 2022-11-02 ENCOUNTER — Telehealth: Payer: Self-pay | Admitting: *Deleted

## 2022-11-02 ENCOUNTER — Encounter: Payer: Self-pay | Admitting: Nurse Practitioner

## 2022-11-02 DIAGNOSIS — K760 Fatty (change of) liver, not elsewhere classified: Secondary | ICD-10-CM

## 2022-11-02 DIAGNOSIS — R160 Hepatomegaly, not elsewhere classified: Secondary | ICD-10-CM

## 2022-11-02 DIAGNOSIS — N289 Disorder of kidney and ureter, unspecified: Secondary | ICD-10-CM

## 2022-11-02 DIAGNOSIS — N159 Renal tubulo-interstitial disease, unspecified: Secondary | ICD-10-CM

## 2022-11-02 LAB — COMPREHENSIVE METABOLIC PANEL
AG Ratio: 1.5 (calc) (ref 1.0–2.5)
ALT: 63 U/L — ABNORMAL HIGH (ref 6–29)
AST: 48 U/L — ABNORMAL HIGH (ref 10–30)
Albumin: 4.2 g/dL (ref 3.6–5.1)
Alkaline phosphatase (APISO): 101 U/L (ref 31–125)
BUN: 14 mg/dL (ref 7–25)
CO2: 21 mmol/L (ref 20–32)
Calcium: 9.5 mg/dL (ref 8.6–10.2)
Chloride: 104 mmol/L (ref 98–110)
Creat: 0.73 mg/dL (ref 0.50–0.96)
Globulin: 2.8 g/dL (calc) (ref 1.9–3.7)
Glucose, Bld: 103 mg/dL — ABNORMAL HIGH (ref 65–99)
Potassium: 3.8 mmol/L (ref 3.5–5.3)
Sodium: 138 mmol/L (ref 135–146)
Total Bilirubin: 0.4 mg/dL (ref 0.2–1.2)
Total Protein: 7 g/dL (ref 6.1–8.1)

## 2022-11-02 LAB — URINALYSIS, COMPLETE W/RFL CULTURE
Bacteria, UA: NONE SEEN /HPF
Casts: NONE SEEN /LPF
Crystals: NONE SEEN /HPF
Glucose, UA: NEGATIVE
Hgb urine dipstick: NEGATIVE
Hyaline Cast: NONE SEEN /LPF
Ketones, ur: NEGATIVE
Leukocyte Esterase: NEGATIVE
Nitrites, Initial: NEGATIVE
RBC / HPF: NONE SEEN /HPF (ref 0–2)
Specific Gravity, Urine: 1.025 (ref 1.001–1.035)
WBC, UA: NONE SEEN /HPF (ref 0–5)
Yeast: NONE SEEN /HPF
pH: 6 (ref 5.0–8.0)

## 2022-11-02 LAB — NO CULTURE INDICATED

## 2022-11-02 NOTE — Telephone Encounter (Signed)
-----   Message from Olivia Mackie, NP sent at 11/02/2022 10:07 AM EST ----- Regarding: MRI abdomen and pelvis, GI referral Please schedule patient for MRI of abdomen/pelvis with or without contrast for possible right kidney lesion. She also needs referral to GI for fatty liver and hepatomegaly. Thanks.

## 2022-11-03 ENCOUNTER — Encounter: Payer: Self-pay | Admitting: Internal Medicine

## 2022-11-03 NOTE — Telephone Encounter (Signed)
Order placed at [facility] for MRI  Referral placed at [facility] they will call to schedule.

## 2022-11-03 NOTE — Telephone Encounter (Signed)
Please see if she can be seen by requested provider or someone within [place]. Thanks.

## 2022-11-03 NOTE — Telephone Encounter (Signed)
Leslie [name] called [facility] and spoke with MRI tech to confirm the orders before [name] put them in. [name] spoke with MRI tech Enid Derry and he said if you are only looking for right kidney lesion,no need to order the MRI of pelvis. Just MRI abdomen with and without contrast. [name] told him [name] will double check with you to make sure you are not looking for anything in pelvis. Please advise

## 2022-11-03 NOTE — Telephone Encounter (Signed)
Yes, MRI of abdomen is fine. Thank you.

## 2022-11-07 ENCOUNTER — Ambulatory Visit: Payer: 59 | Admitting: Rehabilitative and Restorative Service Providers"

## 2022-11-07 ENCOUNTER — Other Ambulatory Visit (HOSPITAL_COMMUNITY): Payer: Self-pay

## 2022-11-07 DIAGNOSIS — N289 Disorder of kidney and ureter, unspecified: Secondary | ICD-10-CM | POA: Diagnosis not present

## 2022-11-07 DIAGNOSIS — K760 Fatty (change of) liver, not elsewhere classified: Secondary | ICD-10-CM | POA: Diagnosis not present

## 2022-11-07 DIAGNOSIS — R1031 Right lower quadrant pain: Secondary | ICD-10-CM | POA: Diagnosis not present

## 2022-11-07 DIAGNOSIS — F902 Attention-deficit hyperactivity disorder, combined type: Secondary | ICD-10-CM | POA: Diagnosis not present

## 2022-11-07 DIAGNOSIS — F419 Anxiety disorder, unspecified: Secondary | ICD-10-CM | POA: Diagnosis not present

## 2022-11-07 MED ORDER — SERTRALINE HCL 100 MG PO TABS
100.0000 mg | ORAL_TABLET | Freq: Every day | ORAL | 1 refills | Status: DC
  Filled 2022-11-07: qty 90, 90d supply, fill #0

## 2022-11-07 NOTE — Telephone Encounter (Signed)
Patient scheduled on 12/07/22 with GI MD.

## 2022-11-07 NOTE — Therapy (Incomplete)
OUTPATIENT PHYSICAL THERAPY EVALUATION   Patient Name: Leslie [name] MRN: [id number] DOB:25-Jun-2001, 21 y.o., female Today's Date: 11/07/2022  END OF SESSION:    Past Medical History:  Diagnosis Date   ADHD (attention deficit hyperactivity disorder)    Anxiety    Past Surgical History:  Procedure Laterality Date   MYRINGOTOMY WITH TUBE PLACEMENT     TONSILLECTOMY     tubes     wisdom teeth     Patient Active Problem List   Diagnosis Date Noted   Vitamin B12 deficiency 05/10/2018   B12 deficiency 02/08/2018    PCP: Fatima Sanger FNP  REFERRING PROVIDER: Julieanne Cotton, PA-C  REFERRING DIAG: M54.2 (ICD-10-CM) - Cervical spine pain  THERAPY DIAG:  No diagnosis found.  Rationale for Evaluation and Treatment: Rehabilitation  ONSET DATE: ***  SUBJECTIVE:                                                                                                                                                                                                         SUBJECTIVE STATEMENT: ***  PERTINENT HISTORY:  ***  PAIN:  NPRS scale: ***/10 Pain location: cervical Pain description: *** Aggravating factors: *** Relieving factors: ***  PRECAUTIONS: None  WEIGHT BEARING RESTRICTIONS: No  FALLS:  Has patient fallen in last 6 months? No  LIVING ENVIRONMENT: Lives with: {OPRC lives with:25569::"lives with their family"} Lives in: {Lives in:25570} Stairs: {opstairs:27293} Has following equipment at home: {Assistive devices:23999}  OCCUPATION: ***  PLOF: Independent  PATIENT GOALS: ***  OBJECTIVE:   PATIENT SURVEYS:  11/07/2022 FOTO intake:    predicted:    COGNITION: 11/07/2022 Overall cognitive status: Within functional limits for tasks assessed  SENSATION: 11/07/2022 {sensation:27233}  POSTURE: 11/07/2022 {posture:25561}  PALPATION: 11/07/2022 ***   CERVICAL ROM:   ROM AROM (deg) 11/07/2022  Flexion   Extension   Right  lateral flexion   Left lateral flexion   Right rotation   Left rotation    (Blank rows = not tested)  UPPER EXTREMITY ROM:  {AROM/PROM:27142} ROM Right 11/07/2022 Left 11/07/2022  Shoulder flexion    Shoulder extension    Shoulder abduction    Shoulder adduction    Shoulder extension    Shoulder internal rotation    Shoulder external rotation    Elbow flexion    Elbow extension    Wrist flexion    Wrist extension    Wrist ulnar deviation    Wrist radial deviation    Wrist pronation    Wrist supination     (Blank rows =  not tested)  UPPER EXTREMITY MMT:  MMT Right 11/07/2022 Left 11/07/2022  Shoulder flexion    Shoulder extension    Shoulder abduction    Shoulder adduction    Shoulder extension    Shoulder internal rotation    Shoulder external rotation    Middle trapezius    Lower trapezius    Elbow flexion    Elbow extension    Wrist flexion    Wrist extension    Wrist ulnar deviation    Wrist radial deviation    Wrist pronation    Wrist supination    Grip strength     (Blank rows = not tested)  CERVICAL SPECIAL TESTS:  11/07/2022 {Cervical special tests:25246}  FUNCTIONAL TESTS:  11/07/2022 {Functional tests:24029}   TODAY'S TREATMENT:                                                                                                                      DATE: 11/07/2022  Therex:    HEP instruction/performance c cues for techniques, handout provided.  Trial set performed of each for comprehension and symptom assessment.  See below for exercise list  PATIENT EDUCATION:  Education details: HEP, POC Person educated: Patient Education method: Explanation, Demonstration, Verbal cues, and Handouts Education comprehension: verbalized understanding, returned demonstration, and verbal cues required  HOME EXERCISE PROGRAM: ***  ASSESSMENT:  CLINICAL IMPRESSION: Patient is a *** y.o. who comes to clinic with complaints of *** with mobility, strength  and movement coordination deficits that impair their ability to perform usual daily and recreational functional activities without increase difficulty/symptoms at this time.  Patient to benefit from skilled PT services to address impairments and limitations to improve to previous level of function without restriction secondary to condition.    OBJECTIVE IMPAIRMENTS: {opptimpairments:25111}.   ACTIVITY LIMITATIONS: {activitylimitations:27494}  PARTICIPATION LIMITATIONS: {participationrestrictions:25113}  PERSONAL FACTORS: {Personal factors:25162} are also affecting patient's functional outcome.   REHAB POTENTIAL: {rehabpotential:25112}  CLINICAL DECISION MAKING: {clinical decision making:25114}  EVALUATION COMPLEXITY: {Evaluation complexity:25115}   GOALS: Goals reviewed with patient? Yes  SHORT TERM GOALS: (target date for Short term goals are 3 weeks 11/28/2022)  1.Patient will demonstrate independent use of home exercise program to maintain progress from in clinic treatments. Goal status: New  LONG TERM GOALS: (target dates for all long term goals are 10 weeks  01/16/2023 )   1. Patient will demonstrate/report pain at worst less than or equal to 2/10 to facilitate minimal limitation in daily activity secondary to pain symptoms. Goal status: New   2. Patient will demonstrate independent use of home exercise program to facilitate ability to maintain/progress functional gains from skilled physical therapy services. Goal status: New   3. Patient will demonstrate FOTO outcome > or = *** % to indicate reduced disability due to condition. Goal status: New   4.  Patient will demonstrate cervical AROM WFL s symptoms to facilitate usual head movements for daily activity including driving, self care.   Goal status: New   5.  ***  Goal status: New   6.  *** Goal status: New   7.  *** Goal Status: New   PLAN:  PT FREQUENCY: 1-2x/week  PT DURATION: 10 weeks  PLANNED  INTERVENTIONS: Therapeutic exercises, Therapeutic activity, Neuro Muscular re-education, Balance training, Gait training, Patient/Family education, Joint mobilization, Stair training, DME instructions, Dry Needling, Electrical stimulation, Cryotherapy, vasopneumatic device,Traction, Moist heat, Taping, Ultrasound, Ionotophoresis 4mg /ml Dexamethasone, and Manual therapy.  All included unless contraindicated  PLAN FOR NEXT SESSION: Check HEP use/response

## 2022-11-07 NOTE — Telephone Encounter (Signed)
Prior Authorization not required. Insurance: [employer] Date: 11/07/2022 Ref #: [id number] Rep: Aaron Mose.

## 2022-11-15 ENCOUNTER — Other Ambulatory Visit: Payer: 59 | Admitting: Nurse Practitioner

## 2022-11-15 ENCOUNTER — Other Ambulatory Visit: Payer: 59

## 2022-11-16 ENCOUNTER — Ambulatory Visit: Payer: 59 | Admitting: Physical Therapy

## 2022-11-20 ENCOUNTER — Ambulatory Visit
Admission: RE | Admit: 2022-11-20 | Discharge: 2022-11-20 | Disposition: A | Discharge status: Home / Self Care | Payer: 59 | Source: Ambulatory Visit | Attending: Nurse Practitioner | Admitting: Nurse Practitioner

## 2022-11-20 DIAGNOSIS — N289 Disorder of kidney and ureter, unspecified: Secondary | ICD-10-CM

## 2022-11-20 DIAGNOSIS — K760 Fatty (change of) liver, not elsewhere classified: Secondary | ICD-10-CM | POA: Diagnosis not present

## 2022-11-20 MED ORDER — GADOPICLENOL 0.5 MMOL/ML IV SOLN
9.0000 mL | Freq: Once | INTRAVENOUS | Status: AC | PRN
  Administered 2022-11-20: 9 mL via INTRAVENOUS

## 2022-11-24 NOTE — Therapy (Signed)
OUTPATIENT PHYSICAL THERAPY CERVICAL EVALUATION   Patient Name: Leslie [name] MRN: [id number] DOB:2001/11/15, 21 y.o., female Today's Date: 11/25/2022  END OF SESSION:   Past Medical History:  Diagnosis Date   ADHD (attention deficit hyperactivity disorder)    Anxiety    Past Surgical History:  Procedure Laterality Date   MYRINGOTOMY WITH TUBE PLACEMENT     TONSILLECTOMY     tubes     wisdom teeth     Patient Active Problem List   Diagnosis Date Noted   Vitamin B12 deficiency 05/10/2018   B12 deficiency 02/08/2018    PCP: Fatima SangerPrevost, Mary Ellen, FNP   REFERRING PROVIDER: Julieanne CottonMagnant, Charles L, PA-C  REFERRING DIAG: Cervical spine pain [M54.2]   THERAPY DIAG:  Abnormal posture  Cervicalgia  Muscle weakness (generalized)  Chronic right shoulder pain  Rationale for Evaluation and Treatment: Rehabilitation  ONSET DATE: A few years.   SUBJECTIVE:                                                                                                                                                                                                         SUBJECTIVE STATEMENT: Pt reports to PT with cervical and shoulder pain. She states that when she was 12 she started playing volleyball and was taught how to hit ball incorrectly. Pt reports poor technique resulting in intermittent shoulder and neck pain. She has been in a sling intermittently. She states that over the years, the pain has gotten progressively worse. Pt reports having a bulging disc and continued pain today.   PERTINENT HISTORY:  ADHD, Anxiety   PAIN:  Are you having pain? Yes: NPRS scale: 5/10 Pain location: R shoulder  Pain description: Sharp pain, numbness into fingers.  Aggravating factors: Laying on it, or overuse.  Relieving factors: Rest  PRECAUTIONS: None  WEIGHT BEARING RESTRICTIONS: No  FALLS:  Has patient fallen in last 6 months? No  LIVING ENVIRONMENT: Lives with: lives with their  family Lives in: House/apartment Stairs: No Has following equipment at home: None  OCCUPATION: Psychologist, sport and exerciseurse tech as [employer]  PLOF: Independent  PATIENT GOALS: Pt would like to use her R shoulder without continued symptoms.   NEXT MD VISIT:   OBJECTIVE:   DIAGNOSTIC FINDINGS:  Cervical:  1. Small central disc protrusion at C4-5 without significant stenosis or cord impingement. 2. Shallow right paracentral disc protrusion at C5-6 without significant stenosis or cord impingement. 3. Otherwise unremarkable MRI of the cervical spine. Shoulder:  1. No evidence of labral tear. Rounded contour of the posterior labrum, which could  be a variant anatomy or mild degenerative changes. 2.  Rotator cuff tendons are intact. 3.  No evidence of fracture or osteonecrosis.  PATIENT SURVEYS:  FOTO 73.51%, 79 in 20 visits.   COGNITION: Overall cognitive status: Within functional limits for tasks assessed  SENSATION: WFL  POSTURE: rounded shoulders and forward head  PALPATION: None   CERVICAL ROM:   Active ROM A/PROM (deg) eval  Flexion WFL  Extension WFL  Right lateral flexion WFL  Left lateral flexion WFL  Right rotation WFL  Left rotation WFL   (Blank rows = not tested)  UPPER EXTREMITY ROM:  Active ROM Right eval Left eval  Shoulder flexion [facility]  Shoulder internal rotation [facility]  Shoulder external rotation Base of skull  C5   (Blank rows = not tested)  UPPER EXTREMITY MMT:  MMT Right eval Left eval  Shoulder flexion 3+ 4-  Shoulder internal rotation 4+ 4+  Shoulder external rotation 4+ 4+  Middle trapezius 4- 4-  Lower trapezius 4- 4-  Grip strength WFL WFL   (Blank rows = not tested)  CERVICAL SPECIAL TESTS:  Upper limb tension test (ULTT): Positive  TODAY'S TREATMENT:                                                                                                                              DATE: Creating, reviewing, and completing below HEP    Trigger Point Dry-Needling  Treatment instructions: Expect mild to moderate muscle soreness. S/S of pneumothorax if dry needled over a lung field, and to seek immediate medical attention should they occur. Patient verbalized understanding of these instructions and education.  Patient Consent Given: No Education handout provided: Previously provided Muscles treated: R UT  Electrical stimulation performed: Yes Parameters:  low frequency, Treatment response/outcome: Decreased tension and pain   PATIENT EDUCATION:  Education details: Educated pt on anatomy and physiology of current symptoms, FOTO, diagnosis, prognosis, HEP,  and POC. Person educated: Patient Education method: Medical illustrator Education comprehension: verbalized understanding and returned demonstration  HOME EXERCISE PROGRAM: Access Code: [email or web address] URL: [email or web address] Date: 11/25/2022 Prepared by: Royal Hawthorn  Exercises - Seated Scapular Retraction  - 2 x daily - 7 x weekly - 3 sets - 10 reps - 2 hold - Seated Levator Scapulae Stretch  - 2 x daily - 7 x weekly - 3 sets - 30 hold - Seated Upper Trapezius Stretch  - 2 x daily - 7 x weekly - 3 sets - 30 hold  ASSESSMENT:  CLINICAL IMPRESSION: Patient referred to PT for cervical and R shoulder pain. She demonstrates functional range of motion, but significant weakness in bilat UE's. Pt positive for impingement tests with isolated tenderness to biceps tendon. Patient will benefit from skilled PT to address below impairments, limitations and improve overall function.  OBJECTIVE IMPAIRMENTS: decreased activity tolerance, decreased shoulder mobility, decreased ROM, decreased strength, impaired flexibility, impaired UE use, postural dysfunction, and pain.  ACTIVITY LIMITATIONS:  reaching, lifting, carry,  cleaning, driving, and or occupation  PERSONAL FACTORS:  also affecting patient's functional outcome.  REHAB POTENTIAL:  Good  CLINICAL DECISION MAKING: Stable/uncomplicated  EVALUATION COMPLEXITY: Low    GOALS: Short term PT Goals Target date: 12/09/2022 Pt will be I and compliant with HEP. Baseline:  Goal status: New Pt will decrease pain by 25% overall Baseline: Goal status: New  Long term PT goals Target date: 01/06/2023 Pt will improve Rt shoulder ER to [facility] to improve functional reaching Baseline: Goal status: New Pt will improve Rt shoulder strength to 1 MMT grade higher for functional strength Baseline: Goal status: New Pt will improve FOTO to at least 79% functional to show improved function Baseline: Goal status: New Pt will reduce pain to overall less than 3/10 with usual activity and work activity. Baseline: Goal status: New 5. Pt will be able to lift 50lbs for work without any issues.       Baseline:              Goal status: New  PLAN: PT FREQUENCY: 1-2 x per week   PT DURATION: 6-8 weeks  PLANNED INTERVENTIONS (unless contraindicated): aquatic PT, Canalith repositioning, cryotherapy, Electrical stimulation, Iontophoresis with 4 mg/ml dexamethasome, Moist heat, traction, Ultrasound, gait training, Therapeutic exercise, balance training, neuromuscular re-education, patient/family education, prosthetic training, manual techniques, passive ROM, dry needling, taping, vasopnuematic device, vestibular, spinal manipulations, joint manipulations  PLAN FOR NEXT SESSION: Assess HEP/update PRN, assess dry needling response. Strengthen parascapular muscles, work on posture.     Lynden Ang, PT 11/25/2022, 11:01 AM

## 2022-11-25 ENCOUNTER — Ambulatory Visit: Payer: 59 | Admitting: Physical Therapy

## 2022-11-25 ENCOUNTER — Other Ambulatory Visit: Payer: Self-pay

## 2022-11-25 DIAGNOSIS — G8929 Other chronic pain: Secondary | ICD-10-CM | POA: Diagnosis not present

## 2022-11-25 DIAGNOSIS — M25511 Pain in right shoulder: Secondary | ICD-10-CM | POA: Diagnosis not present

## 2022-11-25 DIAGNOSIS — M542 Cervicalgia: Secondary | ICD-10-CM

## 2022-11-25 DIAGNOSIS — M6281 Muscle weakness (generalized): Secondary | ICD-10-CM | POA: Diagnosis not present

## 2022-11-25 DIAGNOSIS — R293 Abnormal posture: Secondary | ICD-10-CM

## 2022-11-28 NOTE — Telephone Encounter (Signed)
Routing to [place] for benefits.   Encounter may be closed once reviewed.

## 2022-12-07 ENCOUNTER — Other Ambulatory Visit (INDEPENDENT_AMBULATORY_CARE_PROVIDER_SITE_OTHER): Payer: 59

## 2022-12-07 ENCOUNTER — Ambulatory Visit: Payer: 59 | Admitting: Internal Medicine

## 2022-12-07 ENCOUNTER — Encounter: Payer: Self-pay | Admitting: Internal Medicine

## 2022-12-07 VITALS — BP 116/78 | HR 80 | Ht 60.0 in | Wt 186.6 lb

## 2022-12-07 DIAGNOSIS — K760 Fatty (change of) liver, not elsewhere classified: Secondary | ICD-10-CM

## 2022-12-07 DIAGNOSIS — R7989 Other specified abnormal findings of blood chemistry: Secondary | ICD-10-CM

## 2022-12-07 DIAGNOSIS — R160 Hepatomegaly, not elsewhere classified: Secondary | ICD-10-CM

## 2022-12-07 LAB — IBC + FERRITIN
Ferritin: 126.4 ng/mL (ref 10.0–291.0)
Iron: 53 ug/dL (ref 42–145)
Saturation Ratios: 11.2 % — ABNORMAL LOW (ref 20.0–50.0)
TIBC: 473.2 ug/dL — ABNORMAL HIGH (ref 250.0–450.0)
Transferrin: 338 mg/dL (ref 212.0–360.0)

## 2022-12-07 LAB — PROTIME-INR
INR: 0.9 ratio (ref 0.8–1.0)
Prothrombin Time: 10.3 s (ref 9.6–13.1)

## 2022-12-07 NOTE — Progress Notes (Addendum)
Chief Complaint: Hepatomegaly and advanced hepatic steatosis  HPI : 21 year old female with history of ADHD and anxiety presents with hepatomegaly and fatty liver  Patient presents with her mother to clinic today. She was recently found to have fatty liver on imaging as well as elevated liver tests. Prior to that she has never been told that she has liver disease. She is adopted so she doesn't know her family history. She went to college and spent 3 semesters drinking heavily with about 3-4 alcoholic beverages per day. She quit drinking 2 months ago after she was told that she had fatty liver. Denies N&V. Endorses prior RLQ ab pain, which has gotten better over time. This ab pain was attributed to her birth control. She has lost about 6 lbs deliberately after she has tried to start eating healthier. Denies swelling, confusion, or blood in stools. Denies diarrhea or constipation. Denies dysphagia or GERD. She had an EGD at 53 year olds when she was diagnosed with a peptic ulcer due to NSAID use. She had her Zoloft dosage increased about a month ago. Denies IVDU. 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  Wt Readings from Last 3 Encounters:  12/07/22 186 lb 9.6 oz (84.6 kg)  07/06/22 193 lb (87.5 kg)  07/05/21 190 lb (86.2 kg)   Past Medical History:  Diagnosis Date   ADHD (attention deficit hyperactivity disorder)    Anxiety    Past Surgical History:  Procedure Laterality Date   MYRINGOTOMY WITH TUBE PLACEMENT     TONSILLECTOMY     tubes     wisdom teeth     Family History  Adopted: Yes   Social History   Tobacco Use   Smoking status: Never   Smokeless tobacco: Never  Vaping Use   Vaping Use: Never used  Substance Use Topics   Alcohol use: Not Currently   Drug use: No   Current Outpatient Medications  Medication Sig Dispense Refill   cetirizine (ZYRTEC) 10 MG chewable tablet      Cholecalciferol (VITAMIN D3) 2000 units TABS Take 2,000 Units  by mouth daily.     Clindamycin-Benzoyl Per, Refr, gel Apply a small amount to skin topically in the morning. 45 g 11   Cranberry 1000 MG CAPS      drospirenone-ethinyl estradiol (YASMIN) 3-0.03 MG tablet Take 1 tablet by mouth once a day 84 tablet 3   lisdexamfetamine (VYVANSE) 30 MG capsule Take 1 capsule (30 mg total) by mouth in the morning. 90 capsule 0   sertraline (ZOLOFT) 100 MG tablet Take 1 tablet (100 mg total) by mouth daily. 90 tablet 1   tretinoin (RETIN-A) 0.05 % cream Apply 1 application (a small amount) topically to skin every night 20 g 2   tretinoin (RETIN-A) 0.05 % cream Apply 1 application (a small amount) topically every evening. 20 g 11   vitamin B-12 (CYANOCOBALAMIN) 1000 MCG tablet Take 1,000 mcg by mouth daily.     sertraline (ZOLOFT) 50 MG tablet take 1 tablet by mouth once daily. Orally Once a day (Patient not taking: Reported on 12/07/2022)     No current facility-administered medications for this visit.   Allergies  Allergen Reactions   Amoxicillin-Pot Clavulanate Diarrhea    GI upset   Review of Systems: All systems reviewed and negative except where noted in HPI.   Physical Exam: BP 116/78   Pulse 80   Ht 5' (1.524 m)  Wt 186 lb 9.6 oz (84.6 kg)   SpO2 98%   BMI 36.44 kg/m  Constitutional: Pleasant,well-developed, female in no acute distress. HEENT: Normocephalic and atraumatic. Conjunctivae are normal. No scleral icterus. Cardiovascular: Normal rate, regular rhythm.  Pulmonary/chest: Effort normal and breath sounds normal. No wheezing, rales or rhonchi. Abdominal: Soft, nondistended, nontender. Bowel sounds active throughout. There are no masses palpable. No hepatomegaly. Extremities: No edema Neurological: Alert and oriented to person place and time. Skin: Skin is warm and dry. No rashes noted. Psychiatric: Normal mood and affect. Behavior is normal.  Labs 09/2022: CBC with elevated WBC of 11.2  Labs 10/2022: CMP with elevated AST of 48 and  elevated ALT 63.  CT A/P w/contrast 10/26/22: IMPRESSION: 1. Ill-defined heterogeneous area in the anterior lower pole of the right kidney. This may represent pyelonephritis. Recommend correlation with urinalysis. If there is no clinical or laboratory evidence of urinary tract infection, recommend further assessment with MRI to exclude ill-defined lesion. 2. Hepatomegaly and advanced hepatic steatosis. 3. Normal appendix.  MR Abdomen w/contrast 11/20/22: IMPRESSION: 1. No signal abnormality or abnormal contrast enhancement of the inferior pole of the right kidney to correspond to finding of prior CT. This may have reflected since resolved pyelonephritis. 2. Severe hepatic steatosis.  ASSESSMENT AND PLAN: Fatty liver Hepatomegaly Elevated LFTs Patient presents with fatty liver and elevated LFTs noted on labs and imaging last month. This may be due to [facility] and/or alcoholic liver disease since patient does endorse heavy alcohol consumption in the past. An alternative contributor to her elevated LFTs could include her Zoloft (dosage was recently increased). Will plan for a broad liver lab work up since patient is young and has an unknown family history. Will also plan for elastography to better determine the severity of her liver fibrosis. APRI is 0.516, and NAFLD fibrosis score are -3.45, correlating to low levels of fibrosis, which is relatively reassuring. - Encourage weight loss - Encourage 2 cups of coffee consumption per day - Check PT/INR, hepatitis A antibody, hepatitis B surface antigen, hepatitis B surface antibody, hepatitis C antibody, ferritin/IBC, TTG IgA, IgA, ANA, AMA, ASMA, IgG, ceruloplasmin, alpha-1 antitripsin - Liver U/S with elastography - RTC in 2 months  Eulah Pont, MD  I spent 52 minutes of time, including in depth chart review, independent review of results as outlined above, communicating results with the patient directly, face-to-face time with the patient,  coordinating care, ordering studies and medications as appropriate, and documentation.

## 2022-12-07 NOTE — Patient Instructions (Signed)
If you are age 23 or younger, your body mass index should be between 19-25. Your Body mass index is 36.44 kg/m. If this is out of the aformentioned range listed, please consider follow up with your Primary Care Provider.  ________________________________________________________  The [facility] providers would like to encourage you to use [facility] to communicate with providers for non-urgent requests or questions.  Due to long hold times on the telephone, sending your provider a message by [facility] may be a faster and more efficient way to get a response.  Please allow 48 business hours for a response.  Please remember that this is for non-urgent requests.  _______________________________________________________  Your provider has requested that you go to the basement level for lab work before leaving today. Press "B" on the elevator. The lab is located at the first door on the left as you exit the elevator.  You have been scheduled for an abdominal ultrasound at [facility] (1st floor of hospital) on 12-09-22 at 10:30am. Please arrive prior to your appointment for registration. Make certain not to have anything to eat or drink 6-8 hours prior to your appointment. Should you need to reschedule your appointment, please contact radiology at [phone]. This test typically takes about 30 minutes to perform.  Due to recent changes in healthcare laws, you may see the results of your imaging and laboratory studies on MyChart before your provider has had a chance to review them.  We understand that in some cases there may be results that are confusing or concerning to you. Not all laboratory results come back in the same time frame and the provider may be waiting for multiple results in order to interpret others.  Please give [place] 48 hours in order for your provider to thoroughly review all the results before contacting the office for clarification of your results.   Thank you for entrusting  me with your care and choosing [facility]  Dr Leonides Schanz

## 2022-12-08 NOTE — Therapy (Signed)
OUTPATIENT PHYSICAL THERAPY CERVICAL EVALUATION   Patient Name: Leslie [name] MRN: [id number] DOB:03-02-2001, 21 y.o., female Today's Date: 12/09/2022  END OF SESSION:  PT End of Session - 12/09/22 0836     Visit Number 2    Number of Visits 8    Date for PT Re-Evaluation 01/09/23    PT Start Time 0800    PT Stop Time 0838    PT Time Calculation (min) 38 min    Activity Tolerance Patient tolerated treatment well    Behavior During Therapy [facility] for tasks assessed/performed             Past Medical History:  Diagnosis Date   ADHD (attention deficit hyperactivity disorder)    Anxiety    Past Surgical History:  Procedure Laterality Date   MYRINGOTOMY WITH TUBE PLACEMENT     TONSILLECTOMY     tubes     wisdom teeth     Patient Active Problem List   Diagnosis Date Noted   Vitamin B12 deficiency 05/10/2018   B12 deficiency 02/08/2018    PCP: Fatima Sanger, FNP   REFERRING PROVIDER: Julieanne Cotton, PA-C  REFERRING DIAG: Cervical spine pain [M54.2]   THERAPY DIAG:  Abnormal posture  Cervicalgia  Muscle weakness (generalized)  Chronic right shoulder pain  Stiffness of right shoulder, not elsewhere classified  Rationale for Evaluation and Treatment: Rehabilitation  ONSET DATE: A few years.   SUBJECTIVE:                                                                                                                                                                                                         SUBJECTIVE STATEMENT: Pt states that her R shoulder is sore today. She is not sure if maybe she slept on it funny.   Eval:  Pt reports to PT with cervical and shoulder pain. She states that when she was 12 she started playing volleyball and was taught how to hit ball incorrectly. Pt reports poor technique resulting in intermittent shoulder and neck pain. She has been in a sling intermittently. She states that over the years, the pain has gotten  progressively worse. Pt reports having a bulging disc and continued pain today.   PERTINENT HISTORY:  ADHD, Anxiety   PAIN:  Are you having pain? Yes: NPRS scale: 5/10 Pain location: R shoulder  Pain description: Sharp pain, numbness into fingers.  Aggravating factors: Laying on it, or overuse.  Relieving factors: Rest  PRECAUTIONS: None  WEIGHT BEARING RESTRICTIONS: No  FALLS:  Has patient fallen in  last 6 months? No  LIVING ENVIRONMENT: Lives with: lives with their family Lives in: House/apartment Stairs: No Has following equipment at home: None  OCCUPATION: Psychologist, sport and exercise as [place]  PLOF: Independent  PATIENT GOALS: Pt would like to use her R shoulder without continued symptoms.   NEXT MD VISIT:   OBJECTIVE:   DIAGNOSTIC FINDINGS:  Cervical:  1. Small central disc protrusion at C4-5 without significant stenosis or cord impingement. 2. Shallow right paracentral disc protrusion at C5-6 without significant stenosis or cord impingement. 3. Otherwise unremarkable MRI of the cervical spine. Shoulder:  1. No evidence of labral tear. Rounded contour of the posterior labrum, which could be a variant anatomy or mild degenerative changes. 2.  Rotator cuff tendons are intact. 3.  No evidence of fracture or osteonecrosis.  PATIENT SURVEYS:  FOTO 73.51%, 79 in 20 visits.   COGNITION: Overall cognitive status: Within functional limits for tasks assessed  SENSATION: WFL  POSTURE: rounded shoulders and forward head  PALPATION: None   CERVICAL ROM:   Active ROM A/PROM (deg) eval  Flexion WFL  Extension WFL  Right lateral flexion WFL  Left lateral flexion WFL  Right rotation WFL  Left rotation WFL   (Blank rows = not tested)  UPPER EXTREMITY ROM:  Active ROM Right eval Left eval  Shoulder flexion [facility]  Shoulder internal rotation [facility]  Shoulder external rotation Base of skull  C5   (Blank rows = not tested)  UPPER EXTREMITY MMT:  MMT  Right eval Left eval  Shoulder flexion 3+ 4-  Shoulder internal rotation 4+ 4+  Shoulder external rotation 4+ 4+  Middle trapezius 4- 4-  Lower trapezius 4- 4-  Grip strength WFL WFL   (Blank rows = not tested)  CERVICAL SPECIAL TESTS:  Upper limb tension test (ULTT): Positive  TODAY'S TREATMENT:  12/09/2022: - UBE 3/3 fwd/ bkwd, 6 min - Prone scap retraction with 2 sec hold, x10  - Seated cat/cow, 2 sec hold x10 - Rows with GB x10   Manual:   - STM to biceps - Cross friction massage to biceps tendon - high velocity, low amplitude to thoracic spine.  - Pin and stretch subscap  Trigger Point Dry-Needling  Treatment instructions: Expect mild to moderate muscle soreness. S/S of pneumothorax if dry needled over a lung field, and to seek immediate medical attention should they occur. Patient verbalized understanding of these instructions and education.  Patient Consent Given: No Education handout provided: Previously provided Muscles treated: R UT  Electrical stimulation performed: No Treatment response/outcome: Decreased tension and pain                                                                                                                          DATE: Creating, reviewing, and completing below HEP   Trigger Point Dry-Needling  Treatment instructions: Expect mild to moderate muscle soreness. S/S of pneumothorax if dry needled over a lung field, and to seek immediate medical attention  should they occur. Patient verbalized understanding of these instructions and education.  Patient Consent Given: Yes Education handout provided: Previously provided Muscles treated: R UT  Electrical stimulation performed: Yes Parameters:  low frequency, Treatment response/outcome: Decreased tension and pain   PATIENT EDUCATION:  Education details: Educated pt on anatomy and physiology of current symptoms, FOTO, diagnosis, prognosis, HEP,  and POC. Person educated:  Patient Education method: Medical illustrator Education comprehension: verbalized understanding and returned demonstration  HOME EXERCISE PROGRAM: Access Code: [email or web address] URL: [email or web address] Date: 11/25/2022 Prepared by: Royal Hawthorn  Exercises - Seated Scapular Retraction  - 2 x daily - 7 x weekly - 3 sets - 10 reps - 2 hold - Seated Levator Scapulae Stretch  - 2 x daily - 7 x weekly - 3 sets - 30 hold - Seated Upper Trapezius Stretch  - 2 x daily - 7 x weekly - 3 sets - 30 hold  ASSESSMENT:  CLINICAL IMPRESSION: Patient arrives to first follow up PT appt with reports of pain from sleeping on her shoulder wrong last night. Overall she reports improvements after dry needling last session. Session with focus on mobility and muscle tension reduction. Pt responds well to TPDN. Educated her on importance of postural control. Pt provided a few postural exercises to perform at work and home. Pt will continue to benefit from skilled PT to address continued deficits.    OBJECTIVE IMPAIRMENTS: decreased activity tolerance, decreased shoulder mobility, decreased ROM, decreased strength, impaired flexibility, impaired UE use, postural dysfunction, and pain.  ACTIVITY LIMITATIONS: reaching, lifting, carry,  cleaning, driving, and or occupation  PERSONAL FACTORS:  also affecting patient's functional outcome.  REHAB POTENTIAL: Good  CLINICAL DECISION MAKING: Stable/uncomplicated  EVALUATION COMPLEXITY: Low    GOALS: Short term PT Goals Target date: 12/23/2022 Pt will be I and compliant with HEP. Baseline:  Goal status: New Pt will decrease pain by 25% overall Baseline: Goal status: New  Long term PT goals Target date: 01/20/2023 Pt will improve Rt shoulder ER to [facility] to improve functional reaching Baseline: Goal status: New Pt will improve Rt shoulder strength to 1 MMT grade higher for functional strength Baseline: Goal status: New Pt will improve FOTO to  at least 79% functional to show improved function Baseline: Goal status: New Pt will reduce pain to overall less than 3/10 with usual activity and work activity. Baseline: Goal status: New 5. Pt will be able to lift 50lbs for work without any issues.       Baseline:              Goal status: New  PLAN: PT FREQUENCY: 1-2 x per week   PT DURATION: 6-8 weeks  PLANNED INTERVENTIONS (unless contraindicated): aquatic PT, Canalith repositioning, cryotherapy, Electrical stimulation, Iontophoresis with 4 mg/ml dexamethasome, Moist heat, traction, Ultrasound, gait training, Therapeutic exercise, balance training, neuromuscular re-education, patient/family education, prosthetic training, manual techniques, passive ROM, dry needling, taping, vasopnuematic device, vestibular, spinal manipulations, joint manipulations  PLAN FOR NEXT SESSION: Assess HEP/update PRN, assess dry needling response. Strengthen parascapular muscles, work on posture.     Champ Mungo, PT 12/09/2022, 8:37 AM

## 2022-12-09 ENCOUNTER — Ambulatory Visit (HOSPITAL_COMMUNITY): Payer: 59

## 2022-12-09 ENCOUNTER — Ambulatory Visit: Payer: 59 | Admitting: Physical Therapy

## 2022-12-09 DIAGNOSIS — M25611 Stiffness of right shoulder, not elsewhere classified: Secondary | ICD-10-CM | POA: Diagnosis not present

## 2022-12-09 DIAGNOSIS — M542 Cervicalgia: Secondary | ICD-10-CM

## 2022-12-09 DIAGNOSIS — G8929 Other chronic pain: Secondary | ICD-10-CM

## 2022-12-09 DIAGNOSIS — M6281 Muscle weakness (generalized): Secondary | ICD-10-CM | POA: Diagnosis not present

## 2022-12-09 DIAGNOSIS — M25511 Pain in right shoulder: Secondary | ICD-10-CM | POA: Diagnosis not present

## 2022-12-09 DIAGNOSIS — R293 Abnormal posture: Secondary | ICD-10-CM | POA: Diagnosis not present

## 2022-12-12 ENCOUNTER — Encounter: Payer: Self-pay | Admitting: Internal Medicine

## 2022-12-12 ENCOUNTER — Ambulatory Visit (HOSPITAL_COMMUNITY)
Admission: RE | Admit: 2022-12-12 | Discharge: 2022-12-12 | Disposition: A | Discharge status: Home / Self Care | Payer: 59 | Source: Ambulatory Visit | Attending: Internal Medicine | Admitting: Internal Medicine

## 2022-12-12 DIAGNOSIS — K760 Fatty (change of) liver, not elsewhere classified: Secondary | ICD-10-CM | POA: Diagnosis not present

## 2022-12-12 DIAGNOSIS — R160 Hepatomegaly, not elsewhere classified: Secondary | ICD-10-CM | POA: Diagnosis not present

## 2022-12-12 LAB — HEPATITIS C ANTIBODY: Hepatitis C Ab: NONREACTIVE

## 2022-12-12 LAB — ANTI-NUCLEAR AB-TITER (ANA TITER): ANA Titer 1: 1:80 {titer} — ABNORMAL HIGH

## 2022-12-12 LAB — ANTI-SMOOTH MUSCLE ANTIBODY, IGG: Actin (Smooth Muscle) Antibody (IGG): <20 U (ref <20)

## 2022-12-12 LAB — CERULOPLASMIN: Ceruloplasmin: 51 mg/dL (ref 18–53)

## 2022-12-12 LAB — IGA: Immunoglobulin A: 176 mg/dL (ref 47–310)

## 2022-12-12 LAB — IGG: IgG (Immunoglobin G), Serum: 1263 mg/dL (ref 600–1640)

## 2022-12-12 LAB — ANA: Anti Nuclear Antibody (ANA): POSITIVE — AB

## 2022-12-12 LAB — HEPATITIS B SURFACE ANTIBODY,QUALITATIVE: Hep B S Ab: REACTIVE — AB

## 2022-12-12 LAB — ALPHA-1-ANTITRYPSIN: A-1 Antitrypsin, Ser: 193 mg/dL (ref 83–199)

## 2022-12-12 LAB — HEPATITIS B CORE ANTIBODY, IGM: Hep B C IgM: NONREACTIVE

## 2022-12-12 LAB — HEPATITIS B SURFACE ANTIGEN: Hepatitis B Surface Ag: NONREACTIVE

## 2022-12-12 LAB — HEPATITIS A ANTIBODY, TOTAL: Hepatitis A AB,Total: REACTIVE — AB

## 2022-12-12 LAB — TISSUE TRANSGLUTAMINASE, IGA: (tTG) Ab, IgA: <1 U/mL

## 2022-12-12 LAB — MITOCHONDRIAL ANTIBODIES: Mitochondrial M2 Ab, IgG: <=20 U (ref <=20.0)

## 2022-12-13 NOTE — Telephone Encounter (Signed)
Per review of EPIC, MRI completed 11/20/22.   Seen by Dr. Leonides Schanz GI on 12/07/22.   Routing to [employer].   Encounter closed.

## 2022-12-14 ENCOUNTER — Encounter: Payer: Self-pay | Admitting: Physical Therapy

## 2022-12-14 ENCOUNTER — Ambulatory Visit: Payer: 59 | Admitting: Physical Therapy

## 2022-12-14 DIAGNOSIS — G8929 Other chronic pain: Secondary | ICD-10-CM

## 2022-12-14 DIAGNOSIS — M542 Cervicalgia: Secondary | ICD-10-CM

## 2022-12-14 DIAGNOSIS — R293 Abnormal posture: Secondary | ICD-10-CM | POA: Diagnosis not present

## 2022-12-14 DIAGNOSIS — M25511 Pain in right shoulder: Secondary | ICD-10-CM

## 2022-12-14 DIAGNOSIS — M25611 Stiffness of right shoulder, not elsewhere classified: Secondary | ICD-10-CM | POA: Diagnosis not present

## 2022-12-14 NOTE — Therapy (Signed)
OUTPATIENT PHYSICAL THERAPY +   Patient Name: Leslie [name] MRN: [id number] DOB:Jul 16, 2001, 21 y.o., female Today's Date: 12/14/2022  END OF SESSION:  PT End of Session - 12/14/22 0844     Visit Number 3    Number of Visits 8    Date for PT Re-Evaluation 01/09/23    PT Start Time 0802    PT Stop Time 0845    PT Time Calculation (min) 43 min    Activity Tolerance Patient tolerated treatment well    Behavior During Therapy [facility] for tasks assessed/performed             Past Medical History:  Diagnosis Date   ADHD (attention deficit hyperactivity disorder)    Anxiety    Past Surgical History:  Procedure Laterality Date   MYRINGOTOMY WITH TUBE PLACEMENT     TONSILLECTOMY     tubes     wisdom teeth     Patient Active Problem List   Diagnosis Date Noted   Vitamin B12 deficiency 05/10/2018   B12 deficiency 02/08/2018    PCP: Fatima Sanger, FNP   REFERRING PROVIDER: Julieanne Cotton, PA-C  REFERRING DIAG: Cervical spine pain [M54.2]   THERAPY DIAG:  Abnormal posture  Cervicalgia  Chronic right shoulder pain  Stiffness of right shoulder, not elsewhere classified  Rationale for Evaluation and Treatment: Rehabilitation  ONSET DATE: A few years.   SUBJECTIVE:                                                                                                                                                                                                         SUBJECTIVE STATEMENT: Pt arriving today reporting more muscle fatigue. Pt also reporting popping sensation with forward reaching/flexion.   Eval:  Pt reports to PT with cervical and shoulder pain. She states that when she was 12 she started playing volleyball and was taught how to hit ball incorrectly. Pt reports poor technique resulting in intermittent shoulder and neck pain. She has been in a sling intermittently. She states that over the years, the pain has gotten progressively worse. Pt reports  having a bulging disc and continued pain today.   PERTINENT HISTORY:  ADHD, Anxiety   PAIN:  Are you having pain? Yes: NPRS scale: 3-4/10 Pain location: R shoulder  Pain description: Sharp pain, numbness into fingers.  Aggravating factors: Laying on it, or overuse.  Relieving factors: Rest  PRECAUTIONS: None  WEIGHT BEARING RESTRICTIONS: No  FALLS:  Has patient fallen in last 6 months? No  LIVING ENVIRONMENT: Lives with: lives  with their family Lives in: House/apartment Stairs: No Has following equipment at home: None  OCCUPATION: Nurse tech as [employer]  PLOF: Independent  PATIENT GOALS: Pt would like to use her R shoulder without continued symptoms.   NEXT MD VISIT:   OBJECTIVE:   DIAGNOSTIC FINDINGS:  Cervical:  1. Small central disc protrusion at C4-5 without significant stenosis or cord impingement. 2. Shallow right paracentral disc protrusion at C5-6 without significant stenosis or cord impingement. 3. Otherwise unremarkable MRI of the cervical spine. Shoulder:  1. No evidence of labral tear. Rounded contour of the posterior labrum, which could be a variant anatomy or mild degenerative changes. 2.  Rotator cuff tendons are intact. 3.  No evidence of fracture or osteonecrosis.  PATIENT SURVEYS:  FOTO 73.51%, 79 in 20 visits.   COGNITION: Overall cognitive status: Within functional limits for tasks assessed  SENSATION: WFL  POSTURE: rounded shoulders and forward head  PALPATION: None   CERVICAL ROM:   Active ROM A/PROM (deg) eval  Flexion WFL  Extension WFL  Right lateral flexion WFL  Left lateral flexion WFL  Right rotation WFL  Left rotation WFL   (Blank rows = not tested)  UPPER EXTREMITY ROM:  Active ROM Right eval Left eval  Shoulder flexion [facility]  Shoulder internal rotation [facility]  Shoulder external rotation Base of skull  C5   (Blank rows = not tested)  UPPER EXTREMITY MMT:  MMT Right eval Left eval  Shoulder  flexion 3+ 4-  Shoulder internal rotation 4+ 4+  Shoulder external rotation 4+ 4+  Middle trapezius 4- 4-  Lower trapezius 4- 4-  Grip strength WFL WFL   (Blank rows = not tested)  CERVICAL SPECIAL TESTS:  Upper limb tension test (ULTT): Positive  TODAY'S TREATMENT:  12/09/2022: TherEx:  - UBE 3/3 fwd/ bkwd, 6 min - Quadraped rhomboid and thoracic mobility stretch - Rows with Level 3 band x 15 -shoulder IR Level 3 band x 15 holding 3 sec -Shoulder ER Level 2 band x 15 holding 3 sec  Manual:  - Grade 3-4 AP GH mobs  - active trigger point release and skilled palpation during DN Trigger Point Dry-Needling  Treatment instructions: Expect mild to moderate muscle soreness. S/S of pneumothorax if dry needled over a lung field, and to seek immediate medical attention should they occur. Patient verbalized understanding of these instructions and education.  Patient Consent Given: No Education handout provided: Previously provided Muscles treated: R UT, Rt and left suboccipitals, Rt deltoid, Rt bicep Electrical stimulation performed: No Treatment response/outcome: Decreased tension and pain Modalities; -Moist heat x 5 minute to Rt shoulder and cervical spine    12/09/2022: - UBE 3/3 fwd/ bkwd, 6 min - Prone scap retraction with 2 sec hold, x10  - Seated cat/cow, 2 sec hold x10 - Rows with GB x10   Manual:   - STM to biceps - Cross friction massage to biceps tendon - high velocity, low amplitude to thoracic spine.  - Pin and stretch subscap  Trigger Point Dry-Needling  Treatment instructions: Expect mild to moderate muscle soreness. S/S of pneumothorax if dry needled over a lung field, and to seek immediate medical attention should they occur. Patient verbalized understanding of these instructions and education.  Patient Consent Given: No Education handout provided: Previously provided Muscles treated: R UT  Electrical stimulation performed: No Treatment response/outcome:  Decreased tension and pain  DATE: Creating, reviewing, and completing below HEP   Trigger Point Dry-Needling  Treatment instructions: Expect mild to moderate muscle soreness. S/S of pneumothorax if dry needled over a lung field, and to seek immediate medical attention should they occur. Patient verbalized understanding of these instructions and education.  Patient Consent Given: Yes Education handout provided: Previously provided Muscles treated: R UT  Electrical stimulation performed: Yes Parameters:  low frequency, Treatment response/outcome: Decreased tension and pain   PATIENT EDUCATION:  Education details: Educated pt on anatomy and physiology of current symptoms, FOTO, diagnosis, prognosis, HEP,  and POC. Person educated: Patient Education method: Medical illustrator Education comprehension: verbalized understanding and returned demonstration  HOME EXERCISE PROGRAM: Access Code: [email or web address] URL: [email or web address] Date: 11/25/2022 Prepared by: Royal Hawthorn  Exercises - Seated Scapular Retraction  - 2 x daily - 7 x weekly - 3 sets - 10 reps - 2 hold - Seated Levator Scapulae Stretch  - 2 x daily - 7 x weekly - 3 sets - 30 hold - Seated Upper Trapezius Stretch  - 2 x daily - 7 x weekly - 3 sets - 30 hold  ASSESSMENT:  CLINICAL IMPRESSION:  Pt arriving today with 3-4/10 pain in her Rt shoulder. Pt reporting more muscle fatigue. Pt tolerating exercises well. Palpable difference in muscle tension following TPDN today. Recommended continued skilled PT to address pt's deficits.    OBJECTIVE IMPAIRMENTS: decreased activity tolerance, decreased shoulder mobility, decreased ROM, decreased strength, impaired flexibility, impaired UE use, postural dysfunction, and pain.  ACTIVITY LIMITATIONS: reaching, lifting, carry,  cleaning, driving,  and or occupation  PERSONAL FACTORS:  also affecting patient's functional outcome.  REHAB POTENTIAL: Good  CLINICAL DECISION MAKING: Stable/uncomplicated  EVALUATION COMPLEXITY: Low    GOALS: Short term PT Goals Target date: 12/28/2022 Pt will be I and compliant with HEP. Baseline:  Goal status: New Pt will decrease pain by 25% overall Baseline: Goal status: New  Long term PT goals Target date: 01/25/2023 Pt will improve Rt shoulder ER to [facility] to improve functional reaching Baseline: Goal status: New Pt will improve Rt shoulder strength to 1 MMT grade higher for functional strength Baseline: Goal status: New Pt will improve FOTO to at least 79% functional to show improved function Baseline: Goal status: New Pt will reduce pain to overall less than 3/10 with usual activity and work activity. Baseline: Goal status: New 5. Pt will be able to lift 50lbs for work without any issues.       Baseline:              Goal status: New  PLAN: PT FREQUENCY: 1-2 x per week   PT DURATION: 6-8 weeks  PLANNED INTERVENTIONS (unless contraindicated): aquatic PT, Canalith repositioning, cryotherapy, Electrical stimulation, Iontophoresis with 4 mg/ml dexamethasome, Moist heat, traction, Ultrasound, gait training, Therapeutic exercise, balance training, neuromuscular re-education, patient/family education, prosthetic training, manual techniques, passive ROM, dry needling, taping, vasopnuematic device, vestibular, spinal manipulations, joint manipulations  PLAN FOR NEXT SESSION: Assess HEP/update PRN, assess dry needling response. Strengthen parascapular muscles, work on posture.     Sharmon Leyden, PT 12/14/2022, 8:46 AM

## 2022-12-22 ENCOUNTER — Encounter: Payer: 59 | Admitting: Rehabilitative and Restorative Service Providers"

## 2022-12-24 DIAGNOSIS — Z20822 Contact with and (suspected) exposure to covid-19: Secondary | ICD-10-CM | POA: Diagnosis not present

## 2022-12-24 DIAGNOSIS — U071 COVID-19: Secondary | ICD-10-CM | POA: Diagnosis not present

## 2022-12-28 ENCOUNTER — Other Ambulatory Visit (HOSPITAL_COMMUNITY): Payer: Self-pay

## 2022-12-28 MED ORDER — LAGEVRIO 200 MG PO CAPS
4.0000 | ORAL_CAPSULE | Freq: Two times a day (BID) | ORAL | 0 refills | Status: DC
  Filled 2022-12-28: qty 40, 5d supply, fill #0

## 2023-01-06 ENCOUNTER — Other Ambulatory Visit (HOSPITAL_COMMUNITY): Payer: Self-pay

## 2023-01-10 ENCOUNTER — Other Ambulatory Visit (HOSPITAL_COMMUNITY): Payer: Self-pay

## 2023-01-10 MED ORDER — DROSPIRENONE-ETHINYL ESTRADIOL 3-0.03 MG PO TABS
1.0000 | ORAL_TABLET | Freq: Every day | ORAL | 5 refills | Status: DC
  Filled 2023-01-10: qty 84, 84d supply, fill #0
  Filled 2023-04-05: qty 84, 84d supply, fill #1
  Filled 2023-06-25: qty 84, 84d supply, fill #2
  Filled 2023-09-21: qty 84, 84d supply, fill #3
  Filled 2023-12-12: qty 84, 84d supply, fill #4

## 2023-01-11 ENCOUNTER — Encounter: Payer: Self-pay | Admitting: Physical Therapy

## 2023-01-11 ENCOUNTER — Other Ambulatory Visit (HOSPITAL_COMMUNITY): Payer: Self-pay

## 2023-01-11 ENCOUNTER — Ambulatory Visit: Payer: Commercial Managed Care - PPO | Admitting: Physical Therapy

## 2023-01-11 DIAGNOSIS — M542 Cervicalgia: Secondary | ICD-10-CM | POA: Diagnosis not present

## 2023-01-11 DIAGNOSIS — M25611 Stiffness of right shoulder, not elsewhere classified: Secondary | ICD-10-CM

## 2023-01-11 DIAGNOSIS — M25511 Pain in right shoulder: Secondary | ICD-10-CM | POA: Diagnosis not present

## 2023-01-11 DIAGNOSIS — M6281 Muscle weakness (generalized): Secondary | ICD-10-CM | POA: Diagnosis not present

## 2023-01-11 DIAGNOSIS — G8929 Other chronic pain: Secondary | ICD-10-CM

## 2023-01-11 DIAGNOSIS — R293 Abnormal posture: Secondary | ICD-10-CM

## 2023-01-11 NOTE — Therapy (Addendum)
OUTPATIENT PHYSICAL THERAPY +   Patient Name: CARYLON TAMBURRO MRN: [id number] DOB:Nov 08, 2001, 22 y.o., female Today's Date: 01/11/2023  END OF SESSION:  PT End of Session - 01/11/23 0833     Visit Number 4    Number of Visits 8    Date for PT Re-Evaluation 01/09/23    PT Start Time 0808    PT Stop Time 0846    PT Time Calculation (min) 38 min    Activity Tolerance Patient tolerated treatment well    Behavior During Therapy [facility] for tasks assessed/performed              Past Medical History:  Diagnosis Date   ADHD (attention deficit hyperactivity disorder)    Anxiety    Past Surgical History:  Procedure Laterality Date   MYRINGOTOMY WITH TUBE PLACEMENT     TONSILLECTOMY     tubes     wisdom teeth     Patient Active Problem List   Diagnosis Date Noted   Vitamin B12 deficiency 05/10/2018   B12 deficiency 02/08/2018    PCP: Holland Commons, FNP   REFERRING PROVIDER: Donella Stade, PA-C  REFERRING DIAG: Cervical spine pain [M54.2]   THERAPY DIAG:  Abnormal posture  Cervicalgia  Chronic right shoulder pain  Stiffness of right shoulder, not elsewhere classified  Muscle weakness (generalized)  Rationale for Evaluation and Treatment: Rehabilitation  ONSET DATE: A few years.   SUBJECTIVE:                                                                                                                                                                                                         SUBJECTIVE STATEMENT: Pt arriving reporting 8/10 pain in bilaeral shoulders. Pt stating her neck feels much better.   Eval:  Pt reports to PT with cervical and shoulder pain. She states that when she was 12 she started playing volleyball and was taught how to hit ball incorrectly. Pt reports poor technique resulting in intermittent shoulder and neck pain. She has been in a sling intermittently. She states that over the years, the pain has gotten progressively worse.  Pt reports having a bulging disc and continued pain today.   PERTINENT HISTORY:  ADHD, Anxiety   PAIN:  Are you having pain? Yes: NPRS scale: 3-4/10 Pain location: R shoulder  Pain description: Sharp pain, numbness into fingers.  Aggravating factors: Laying on it, or overuse.  Relieving factors: Rest  PRECAUTIONS: None  WEIGHT BEARING RESTRICTIONS: No  FALLS:  Has patient fallen in last 6 months? No  LIVING ENVIRONMENT: Lives with: lives with their family Lives in: House/apartment Stairs: No Has following equipment at home: None  OCCUPATION: Psychologist, sport and exercise as [employer]  PLOF: Independent  PATIENT GOALS: Pt would like to use her R shoulder without continued symptoms.     OBJECTIVE:   DIAGNOSTIC FINDINGS:  Cervical:  1. Small central disc protrusion at C4-5 without significant stenosis or cord impingement. 2. Shallow right paracentral disc protrusion at C5-6 without significant stenosis or cord impingement. 3. Otherwise unremarkable MRI of the cervical spine. Shoulder:  1. No evidence of labral tear. Rounded contour of the posterior labrum, which could be a variant anatomy or mild degenerative changes. 2.  Rotator cuff tendons are intact. 3.  No evidence of fracture or osteonecrosis.  PATIENT SURVEYS:  FOTO 73.51%, 79 in 20 visits.   COGNITION: Overall cognitive status: Within functional limits for tasks assessed  SENSATION: WFL  POSTURE: rounded shoulders and forward head  PALPATION: None   CERVICAL ROM:   Active ROM A/PROM (deg) eval  Flexion WFL  Extension WFL  Right lateral flexion WFL  Left lateral flexion WFL  Right rotation WFL  Left rotation WFL   (Blank rows = not tested)  UPPER EXTREMITY ROM:  Active ROM Right eval Left eval  Shoulder flexion [facility]  Shoulder internal rotation [facility]  Shoulder external rotation Base of skull  C5   (Blank rows = not tested)  UPPER EXTREMITY MMT:  MMT Right eval Left eval  Shoulder flexion  3+ 4-  Shoulder internal rotation 4+ 4+  Shoulder external rotation 4+ 4+  Middle trapezius 4- 4-  Lower trapezius 4- 4-  Grip strength WFL WFL   (Blank rows = not tested)  CERVICAL SPECIAL TESTS:  Upper limb tension test (ULTT): Positive  TODAY'S TREATMENT:  12/11/2022: TherEx:  UBE 2/2 fwd/ bkwd, 6 min Shoulder flexion c 3# bar 2 x 10 with yellow ball in lumbar curve while performing mini squat against the wall.  Wall walks with red theraband around both hands with shoulders abducted and elbows extended x 10 Cervical retraction in standing against the wall pushing head into yellow ball   Manual:  active trigger point release and skilled palpation during DN Trigger Point Dry-Needling  Treatment instructions: Expect mild to moderate muscle soreness. S/S of pneumothorax if dry needled over a lung field, and to seek immediate medical attention should they occur. Patient verbalized understanding of these instructions and education.  Patient Consent Given: yes Education handout provided: Previously provided Muscles treated: Bilateral deltoids (anterior, medial, posterior), left tricep Electrical stimulation performed: No Treatment response/outcome: Decreased tension and pain     12/09/2022: - UBE 3/3 fwd/ bkwd, 6 min - Prone scap retraction with 2 sec hold, x10  - Seated cat/cow, 2 sec hold x10 - Rows with GB x10   Manual:   - STM to biceps - Cross friction massage to biceps tendon - high velocity, low amplitude to thoracic spine.  - Pin and stretch subscap  Trigger Point Dry-Needling  Treatment instructions: Expect mild to moderate muscle soreness. S/S of pneumothorax if dry needled over a lung field, and to seek immediate medical attention should they occur. Patient verbalized understanding of these instructions and education.  Patient Consent Given: Yes Education handout provided: Previously provided Muscles treated: R UT  Electrical stimulation performed:  No Treatment response/outcome: Decreased tension and pain  DATE: Creating, reviewing, and completing below HEP   Trigger Point Dry-Needling  Treatment instructions: Expect mild to moderate muscle soreness. S/S of pneumothorax if dry needled over a lung field, and to seek immediate medical attention should they occur. Patient verbalized understanding of these instructions and education.  Patient Consent Given: Yes Education handout provided: Previously provided Muscles treated: R UT  Electrical stimulation performed: Yes Parameters:  low frequency, Treatment response/outcome: Decreased tension and pain   PATIENT EDUCATION:  Education details: Educated pt on anatomy and physiology of current symptoms, FOTO, diagnosis, prognosis, HEP,  and POC. Person educated: Patient Education method: Medical illustrator Education comprehension: verbalized understanding and returned demonstration  HOME EXERCISE PROGRAM: Access Code: [email or web address] URL: [email or web address] Date: 01/11/2023 Prepared by: Narda Amber  Exercises - Seated Scapular Retraction  - 2 x daily - 7 x weekly - 3 sets - 10 reps - 2 hold - Seated Levator Scapulae Stretch  - 2 x daily - 7 x weekly - 3 sets - 30 hold - Seated Upper Trapezius Stretch  - 2 x daily - 7 x weekly - 3 sets - 30 hold - Standing Bilateral Low Shoulder Row with Anchored Resistance  - 2 x daily - 7 x weekly - 2 sets - 15 reps - 2-3 seconds hold - Shoulder External Rotation and Scapular Retraction with Resistance  - 2 x daily - 7 x weekly - 2 sets - 15 reps - Shoulder extension with resistance - Neutral  - 2 x daily - 7 x weekly - 2 sets - 15 reps - 2-3 seconds hold - Horizontal Wall Walk with Resistance  - 1 x daily - 7 x weekly - 10 reps  ASSESSMENT:  CLINICAL IMPRESSION:  Pt arriving today reporting 8/10  upon arrival and 4-5/10 at end of session. Pt feels the DN is helping the most. Pt tolerating all shoulder exercises well. Pt was issued red and green theraband to progress her HEP. Recommended continued skilled PT to address pt's deficits.    OBJECTIVE IMPAIRMENTS: decreased activity tolerance, decreased shoulder mobility, decreased ROM, decreased strength, impaired flexibility, impaired UE use, postural dysfunction, and pain.  ACTIVITY LIMITATIONS: reaching, lifting, carry,  cleaning, driving, and or occupation  PERSONAL FACTORS:  also affecting patient's functional outcome.  REHAB POTENTIAL: Good  CLINICAL DECISION MAKING: Stable/uncomplicated  EVALUATION COMPLEXITY: Low    GOALS: Short term PT Goals Target date: 01/25/2023 Pt will be I and compliant with HEP. Baseline:  Goal status: New Pt will decrease pain by 25% overall Baseline: Goal status: New  Long term PT goals Target date: 02/22/2023 Pt will improve Rt shoulder ER to [facility] to improve functional reaching Baseline: Goal status: New Pt will improve Rt shoulder strength to 1 MMT grade higher for functional strength Baseline: Goal status: New Pt will improve FOTO to at least 79% functional to show improved function Baseline: Goal status: New Pt will reduce pain to overall less than 3/10 with usual activity and work activity. Baseline: Goal status: New 5. Pt will be able to lift 50lbs for work without any issues.       Baseline:              Goal status: New  PLAN: PT FREQUENCY: 1-2 x per week   PT DURATION: 6-8 weeks  PLANNED INTERVENTIONS (unless contraindicated): aquatic PT, Canalith repositioning, cryotherapy, Electrical stimulation, Iontophoresis with 4 mg/ml dexamethasome, Moist heat, traction, Ultrasound, gait training, Therapeutic exercise, balance training, neuromuscular re-education, patient/family education, prosthetic training, manual techniques,  passive ROM, dry needling, taping, vasopnuematic device,  vestibular, spinal manipulations, joint manipulations  PLAN FOR NEXT SESSION:  assess dry needling response. Strengthen parascapular muscles, work on posture.     Oretha Caprice, PT, MPT 01/11/2023, 9:13 AM

## 2023-01-16 ENCOUNTER — Ambulatory Visit: Payer: Self-pay | Admitting: Internal Medicine

## 2023-01-23 ENCOUNTER — Encounter: Payer: Commercial Managed Care - PPO | Admitting: Physical Therapy

## 2023-01-25 ENCOUNTER — Ambulatory Visit: Payer: Commercial Managed Care - PPO | Admitting: Physical Therapy

## 2023-01-25 ENCOUNTER — Encounter: Payer: Self-pay | Admitting: Physical Therapy

## 2023-01-25 DIAGNOSIS — M542 Cervicalgia: Secondary | ICD-10-CM

## 2023-01-25 DIAGNOSIS — M25511 Pain in right shoulder: Secondary | ICD-10-CM | POA: Diagnosis not present

## 2023-01-25 DIAGNOSIS — G8929 Other chronic pain: Secondary | ICD-10-CM | POA: Diagnosis not present

## 2023-01-25 DIAGNOSIS — M25611 Stiffness of right shoulder, not elsewhere classified: Secondary | ICD-10-CM

## 2023-01-25 DIAGNOSIS — R293 Abnormal posture: Secondary | ICD-10-CM

## 2023-01-25 DIAGNOSIS — M6281 Muscle weakness (generalized): Secondary | ICD-10-CM | POA: Diagnosis not present

## 2023-01-25 NOTE — Therapy (Addendum)
OUTPATIENT PHYSICAL THERAPY TREATMENT NOTE   Patient Name: Leslie [name] MRN: [id number] DOB:07/18/2001, 22 y.o., female Today's Date: 01/25/2023  END OF SESSION:  PT End of Session - 01/25/23 0907     Visit Number 5    Number of Visits 8    Date for PT Re-Evaluation 01/09/23    PT Start Time 0852    PT Stop Time 0930    PT Time Calculation (min) 38 min    Activity Tolerance Patient tolerated treatment well    Behavior During Therapy [facility] for tasks assessed/performed               Past Medical History:  Diagnosis Date   ADHD (attention deficit hyperactivity disorder)    Anxiety    Past Surgical History:  Procedure Laterality Date   MYRINGOTOMY WITH TUBE PLACEMENT     TONSILLECTOMY     tubes     wisdom teeth     Patient Active Problem List   Diagnosis Date Noted   Vitamin B12 deficiency 05/10/2018   B12 deficiency 02/08/2018    PCP: Holland Commons, FNP   REFERRING PROVIDER: Donella Stade, PA-C  REFERRING DIAG: Cervical spine pain [M54.2]   THERAPY DIAG:  Abnormal posture  Cervicalgia  Chronic right shoulder pain  Stiffness of right shoulder, not elsewhere classified  Muscle weakness (generalized)  Rationale for Evaluation and Treatment: Rehabilitation  ONSET DATE: A few years.   SUBJECTIVE:                                                                                                                                                                                                         SUBJECTIVE STATEMENT: Pt reporting 5/10 pain in her cervical spine. Pt stating her shoulders are much better today. Pt also reporting good response to DN at last visit.   Eval:  Pt reports to PT with cervical and shoulder pain. She states that when she was 12 she started playing volleyball and was taught how to hit ball incorrectly. Pt reports poor technique resulting in intermittent shoulder and neck pain. She has been in a sling intermittently. She  states that over the years, the pain has gotten progressively worse. Pt reports having a bulging disc and continued pain today.   PERTINENT HISTORY:  ADHD, Anxiety   PAIN:  Are you having pain? Yes: NPRS scale: 5/10 Pain location: R shoulder  Pain description: Sharp pain, numbness into fingers.  Aggravating factors: Laying on it, or overuse.  Relieving factors: Rest  PRECAUTIONS: None  WEIGHT BEARING RESTRICTIONS:  No  FALLS:  Has patient fallen in last 6 months? 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  PLOF: Independent  PATIENT GOALS: Pt would like to use her R shoulder without continued symptoms.     OBJECTIVE:   DIAGNOSTIC FINDINGS:  Cervical:  1. Small central disc protrusion at C4-5 without significant stenosis or cord impingement. 2. Shallow right paracentral disc protrusion at C5-6 without significant stenosis or cord impingement. 3. Otherwise unremarkable MRI of the cervical spine. Shoulder:  1. No evidence of labral tear. Rounded contour of the posterior labrum, which could be a variant anatomy or mild degenerative changes. 2.  Rotator cuff tendons are intact. 3.  No evidence of fracture or osteonecrosis.  PATIENT SURVEYS:  FOTO 73.51%, 79 in 20 visits.   COGNITION: Overall cognitive status: Within functional limits for tasks assessed  SENSATION: WFL  POSTURE: rounded shoulders and forward head  PALPATION: None   CERVICAL ROM:   Active ROM A/PROM (deg) eval  Flexion WFL  Extension WFL  Right lateral flexion WFL  Left lateral flexion WFL  Right rotation WFL  Left rotation WFL   (Blank rows = not tested)  UPPER EXTREMITY ROM:  Active ROM Right eval Left eval  Shoulder flexion [facility]  Shoulder internal rotation [facility]  Shoulder external rotation Base of skull  C5   (Blank rows = not  tested)  UPPER EXTREMITY MMT:  MMT Right eval Left eval Rt/Left 01/25/23  Shoulder flexion 3+ 4- 4/5 / 5/5  Shoulder internal rotation 4+ 4+ 5/5 / 5/5  Shoulder external rotation 4+ 4+ 5/5 / 5/5  Middle trapezius 4- 4-   Lower trapezius 4- 4-   Grip strength WFL WFL    (Blank rows = not tested)  CERVICAL SPECIAL TESTS:  Upper limb tension test (ULTT): Positive  TODAY'S TREATMENT:  01/25/2022: TherEx:  UBE 2/2 fwd/ bkwd, 6 min Cervical retraction x 10 holding 10 sec c chin tucks Shoulder extension on BATCA 5 # each UE 2 x 15 Rows on BATCA 10# each UE, 2 x 15 Lat pull downs 15# 2 x 15 Shoulder stability: rolling ball on wall x 30 circles each direction on each UE Manual:  active trigger point release and skilled palpation during DN Trigger Point Dry-Needling  Treatment instructions: Expect mild to moderate muscle soreness. S/S of pneumothorax if dry needled over a lung field, and to seek immediate medical attention should they occur. Patient verbalized understanding of these instructions and education.  Patient Consent Given: No Education handout provided: Previously provided Muscles treated: bilateral cervical paraspinals  Electrical stimulation performed: No Treatment response/outcome: multiple twitches elicited, Decreased tension and pain  01/11/2022: TherEx:  UBE 2/2 fwd/ bkwd, 6 min Shoulder flexion c 3# bar 2 x 10 with yellow ball in lumbar curve while performing mini squat against the wall.  Wall walks with red theraband around both hands with shoulders abducted and elbows extended x 10 Cervical retraction in standing against the wall pushing head into yellow ball   Manual:  active trigger point release and skilled palpation during DN Trigger Point Dry-Needling  Treatment instructions: Expect mild to moderate muscle soreness. S/S of pneumothorax if dry needled over a lung field, and to seek immediate medical attention should they occur. Patient verbalized  understanding of these instructions and education.  Patient Consent Given: No Education handout provided: Previously provided Muscles treated:  Bilateral deltoids (anterior, medial, posterior), left tricep Electrical stimulation performed: No Treatment response/outcome: Decreased tension and pain     12/09/2022: - UBE 3/3 fwd/ bkwd, 6 min - Prone scap retraction with 2 sec hold, x10  - Seated cat/cow, 2 sec hold x10 - Rows with GB x10   Manual:   - STM to biceps - Cross friction massage to biceps tendon - high velocity, low amplitude to thoracic spine.  - Pin and stretch subscap  Trigger Point Dry-Needling  Treatment instructions: Expect mild to moderate muscle soreness. S/S of pneumothorax if dry needled over a lung field, and to seek immediate medical attention should they occur. Patient verbalized understanding of these instructions and education.  Patient Consent Given: No Education handout provided: Previously provided Muscles treated: R UT  Electrical stimulation performed: No Treatment response/outcome: Decreased tension and pain                                                                                                                             PATIENT EDUCATION:  Education details: Educated pt on anatomy and physiology of current symptoms, FOTO, diagnosis, prognosis, HEP,  and POC. Person educated: Patient Education method: Customer service manager Education comprehension: verbalized understanding and returned demonstration  HOME EXERCISE PROGRAM: Access Code: [email or web address] URL: [email or web address] Date: 01/11/2023 Prepared by: Kearney Hard  Exercises - Seated Scapular Retraction  - 2 x daily - 7 x weekly - 3 sets - 10 reps - 2 hold - Seated Levator Scapulae Stretch  - 2 x daily - 7 x weekly - 3 sets - 30 hold - Seated Upper Trapezius Stretch  - 2 x daily - 7 x weekly - 3 sets - 30 hold - Standing Bilateral Low Shoulder Row with  Anchored Resistance  - 2 x daily - 7 x weekly - 2 sets - 15 reps - 2-3 seconds hold - Shoulder External Rotation and Scapular Retraction with Resistance  - 2 x daily - 7 x weekly - 2 sets - 15 reps - Shoulder extension with resistance - Neutral  - 2 x daily - 7 x weekly - 2 sets - 15 reps - 2-3 seconds hold - Horizontal Wall Walk with Resistance  - 1 x daily - 7 x weekly - 10 reps  ASSESSMENT:  CLINICAL IMPRESSION:  Pt arriving today reporting 5/10 cervical pain. Pt reporting her shoulder was much better following her last treatment session. DN performed again this visit to pt's bilateral paraspinals. Pt tolerating all exercises well. I am requesting 8 additional PT visits due to pt's on-going pain which varies.  Continue skilled PT to progress toward LTG's met.    OBJECTIVE IMPAIRMENTS: decreased activity tolerance, decreased shoulder mobility, decreased ROM, decreased strength, impaired flexibility, impaired UE use, postural dysfunction, and pain.  ACTIVITY LIMITATIONS: reaching, lifting, carry,  cleaning, driving, and or occupation  PERSONAL FACTORS:  also affecting patient's functional outcome.  REHAB POTENTIAL: Good  CLINICAL DECISION MAKING: Stable/uncomplicated  EVALUATION  COMPLEXITY: Low    GOALS: Short term PT Goals Target date: 02/08/2023 Pt will be I and compliant with HEP. Baseline:  Goal status: New Pt will decrease pain by 25% overall Baseline: Goal status: New  Long term PT goals Target date: 03/08/2023 Pt will improve Rt shoulder ER to [facility] to improve functional reaching Baseline: Goal status: Ongoing 01/25/23:  Pt will improve Rt shoulder strength to 1 MMT grade higher for functional strength Baseline: Goal status: MET 01/25/23 Pt will improve FOTO to at least 79% functional to show improved function Baseline: Goal status: Ongoing 01/25/23:  Pt will reduce pain to overall less than 3/10 with usual activity and work activity. Baseline: Goal status: Ongoing  01/25/23:  5. Pt will be able to lift 50lbs for work without any issues.       Baseline:              Goal status:Ongoing 01/25/23:   PLAN: PT FREQUENCY: 1x per week   PT DURATION: 8 weeks  PLANNED INTERVENTIONS (unless contraindicated): aquatic PT, Canalith repositioning, cryotherapy, Electrical stimulation, Iontophoresis with 4 mg/ml dexamethasome, Moist heat, traction, Ultrasound, gait training, Therapeutic exercise, balance training, neuromuscular re-education, patient/family education, prosthetic training, manual techniques, passive ROM, dry needling, taping, vasopnuematic device, vestibular, spinal manipulations, joint manipulations  PLAN FOR NEXT SESSION:  assess dry needling response. Strengthen parascapular muscles, work on postural strengthening    Oretha Caprice, PT, MPT 01/25/2023, 9:22 AM

## 2023-01-25 NOTE — Addendum Note (Signed)
Addended by: Kearney Hard R on: 01/25/2023 11:04 AM   Modules accepted: Orders

## 2023-01-31 ENCOUNTER — Ambulatory Visit: Payer: Commercial Managed Care - PPO | Admitting: Physical Therapy

## 2023-01-31 ENCOUNTER — Encounter: Payer: Self-pay | Admitting: Physical Therapy

## 2023-01-31 DIAGNOSIS — M25511 Pain in right shoulder: Secondary | ICD-10-CM

## 2023-01-31 DIAGNOSIS — M25611 Stiffness of right shoulder, not elsewhere classified: Secondary | ICD-10-CM | POA: Diagnosis not present

## 2023-01-31 DIAGNOSIS — G8929 Other chronic pain: Secondary | ICD-10-CM

## 2023-01-31 DIAGNOSIS — R293 Abnormal posture: Secondary | ICD-10-CM

## 2023-01-31 DIAGNOSIS — M6281 Muscle weakness (generalized): Secondary | ICD-10-CM

## 2023-01-31 DIAGNOSIS — M542 Cervicalgia: Secondary | ICD-10-CM

## 2023-01-31 NOTE — Therapy (Signed)
OUTPATIENT PHYSICAL THERAPY TREATMENT NOTE   Patient Name: Leslie [name] MRN: [id number] DOB:2001-04-30, 22 y.o., female Today's Date: 01/31/2023  END OF SESSION:  PT End of Session - 01/31/23 0759     Visit Number 6    Number of Visits 12    Date for PT Re-Evaluation 03/10/23    PT Start Time 0800    PT Stop Time 0840    PT Time Calculation (min) 40 min    Activity Tolerance Patient tolerated treatment well    Behavior During Therapy [facility] for tasks assessed/performed               Past Medical History:  Diagnosis Date   ADHD (attention deficit hyperactivity disorder)    Anxiety    Past Surgical History:  Procedure Laterality Date   MYRINGOTOMY WITH TUBE PLACEMENT     TONSILLECTOMY     tubes     wisdom teeth     Patient Active Problem List   Diagnosis Date Noted   Vitamin B12 deficiency 05/10/2018   B12 deficiency 02/08/2018    PCP: Holland Commons, FNP   REFERRING PROVIDER: Donella Stade, PA-C  REFERRING DIAG: Cervical spine pain [M54.2]   THERAPY DIAG:  Abnormal posture  Cervicalgia  Chronic right shoulder pain  Stiffness of right shoulder, not elsewhere classified  Muscle weakness (generalized)  Rationale for Evaluation and Treatment: Rehabilitation  ONSET DATE: A few years.   SUBJECTIVE:                                                                                                                                                                                                         SUBJECTIVE STATEMENT: pt stating her pain this morning was 8/10 in her Rt shoulder.   Eval:  Pt reports to PT with cervical and shoulder pain. She states that when she was 12 she started playing volleyball and was taught how to hit ball incorrectly. Pt reports poor technique resulting in intermittent shoulder and neck pain. She has been in a sling intermittently. She states that over the years, the pain has gotten progressively worse. Pt reports  having a bulging disc and continued pain today.   PERTINENT HISTORY:  ADHD, Anxiety   PAIN:  Are you having pain? Yes: NPRS scale: 5/10 Pain location: R shoulder  Pain description: Sharp pain, numbness into fingers.  Aggravating factors: Laying on it, or overuse.  Relieving factors: Rest  PRECAUTIONS: None  WEIGHT BEARING RESTRICTIONS: No  FALLS:  Has patient fallen in last 6 months? No  LIVING  ENVIRONMENT: Lives with: lives with their family Lives in: House/apartment Stairs: No Has following equipment at home: None  OCCUPATION: Chartered certified accountant as [employer]  PLOF: Independent  PATIENT GOALS: Pt would like to use her R shoulder without continued symptoms.     OBJECTIVE:   DIAGNOSTIC FINDINGS:  Cervical:  1. Small central disc protrusion at C4-5 without significant stenosis or cord impingement. 2. Shallow right paracentral disc protrusion at C5-6 without significant stenosis or cord impingement. 3. Otherwise unremarkable MRI of the cervical spine. Shoulder:  1. No evidence of labral tear. Rounded contour of the posterior labrum, which could be a variant anatomy or mild degenerative changes. 2.  Rotator cuff tendons are intact. 3.  No evidence of fracture or osteonecrosis.  PATIENT SURVEYS:  FOTO 73.51%, 79 in 20 visits.   COGNITION: Overall cognitive status: Within functional limits for tasks assessed  SENSATION: WFL  POSTURE: rounded shoulders and forward head  PALPATION: None   CERVICAL ROM:   Active ROM A/PROM (deg) eval  Flexion WFL  Extension WFL  Right lateral flexion WFL  Left lateral flexion WFL  Right rotation WFL  Left rotation WFL   (Blank rows = not tested)  UPPER EXTREMITY ROM:  Active ROM Right eval Left eval  Shoulder flexion [facility]  Shoulder internal rotation [facility]  Shoulder external rotation Base of skull  C5   (Blank rows = not tested)  UPPER EXTREMITY MMT:  MMT Right eval Left eval Rt/Left 01/25/23  Shoulder  flexion 3+ 4- 4/5 / 5/5  Shoulder internal rotation 4+ 4+ 5/5 / 5/5  Shoulder external rotation 4+ 4+ 5/5 / 5/5  Middle trapezius 4- 4-   Lower trapezius 4- 4-   Grip strength WFL WFL    (Blank rows = not tested)  CERVICAL SPECIAL TESTS:  Upper limb tension test (ULTT): Positive  TODAY'S TREATMENT:  01/31/2022: TherEx:  Rows: Level 4 band x 20 Shoulder Ext level 4 band x 20 Prone: lifting arm over 2 small cones 2 x 5  Prone in in 1/2 plank: pushing up from on elbows to full elbow extension x 5 leading with each UE Supine: shoulder flexion with 2# bar with 4# weight around bar x 20  Shoulder stability: rolling ball on wall x 30 circles each direction on each UE Manual:  active trigger point release and skilled palpation during DN STM to Rt shoulder Trigger Point Dry-Needling  Treatment instructions: Expect mild to moderate muscle soreness. S/S of pneumothorax if dry needled over a lung field, and to seek immediate medical attention should they occur. Patient verbalized understanding of these instructions and education.  Patient Consent Given: Yes Education handout provided: Previously provided Muscles treated: Rt middle and anterior deltoid, Rt bicep Electrical stimulation performed: No Treatment response/outcome: multiple twitches elicited, Decreased tension and pain   01/25/2022: TherEx:  UBE 2/2 fwd/ bkwd, 6 min Cervical retraction x 10 holding 10 sec c chin tucks Shoulder extension on BATCA 5 # each UE 2 x 15 Rows on BATCA 10# each UE, 2 x 15 Lat pull downs 15# 2 x 15 Shoulder stability: rolling ball on wall x 30 circles each direction on each UE Manual:  active trigger point release and skilled palpation during DN Trigger Point Dry-Needling  Treatment instructions: Expect mild to moderate muscle soreness. S/S of pneumothorax if dry needled over a lung field, and to seek immediate medical attention should they occur. Patient verbalized understanding of these instructions  and education.  Patient Consent Given:  No Education handout provided: Previously provided Muscles treated: bilateral cervical paraspinals  Electrical stimulation performed: No Treatment response/outcome: multiple twitches elicited, Decreased tension and pain  01/11/2022: TherEx:  UBE 2/2 fwd/ bkwd, 6 min Shoulder flexion c 3# bar 2 x 10 with yellow ball in lumbar curve while performing mini squat against the wall.  Wall walks with red theraband around both hands with shoulders abducted and elbows extended x 10 Cervical retraction in standing against the wall pushing head into yellow ball   Manual:  active trigger point release and skilled palpation during DN Trigger Point Dry-Needling  Treatment instructions: Expect mild to moderate muscle soreness. S/S of pneumothorax if dry needled over a lung field, and to seek immediate medical attention should they occur. Patient verbalized understanding of these instructions and education.  Patient Consent Given: Yes Education handout provided: Previously provided Muscles treated: Bilateral deltoids (anterior, medial, posterior), left tricep Electrical stimulation performed: No Treatment response/outcome: Decreased tension and pain                                                                                                                                 PATIENT EDUCATION:  Education details: Educated pt on anatomy and physiology of current symptoms, FOTO, diagnosis, prognosis, HEP,  and POC. Person educated: Patient Education method: Customer service manager Education comprehension: verbalized understanding and returned demonstration  HOME EXERCISE PROGRAM: Access Code: [email or web address] URL: [email or web address] Date: 01/11/2023 Prepared by: Kearney Hard  Exercises - Seated Scapular Retraction  - 2 x daily - 7 x weekly - 3 sets - 10 reps - 2 hold - Seated Levator Scapulae Stretch  - 2 x daily - 7 x weekly - 3  sets - 30 hold - Seated Upper Trapezius Stretch  - 2 x daily - 7 x weekly - 3 sets - 30 hold - Standing Bilateral Low Shoulder Row with Anchored Resistance  - 2 x daily - 7 x weekly - 2 sets - 15 reps - 2-3 seconds hold - Shoulder External Rotation and Scapular Retraction with Resistance  - 2 x daily - 7 x weekly - 2 sets - 15 reps - Shoulder extension with resistance - Neutral  - 2 x daily - 7 x weekly - 2 sets - 15 reps - 2-3 seconds hold - Horizontal Wall Walk with Resistance  - 1 x daily - 7 x weekly - 10 reps  ASSESSMENT:  CLINICAL IMPRESSION:  Pt arriving reporting 8/10 pain this morning in her Rt shoulder. Pt wishing to be DN again. Pt with good response to TPDN with multiple twitch responses noted and pt reporting relief following. Pt tolerating all shoulder stability and strengthening exercises well. Continue skilled PT to maximize pt's function.     OBJECTIVE IMPAIRMENTS: decreased activity tolerance, decreased shoulder mobility, decreased ROM, decreased strength, impaired flexibility, impaired UE use, postural dysfunction, and pain.  ACTIVITY LIMITATIONS: reaching, lifting, carry,  cleaning, driving, and or occupation  PERSONAL  FACTORS:  also affecting patient's functional outcome.  REHAB POTENTIAL: Good  CLINICAL DECISION MAKING: Stable/uncomplicated  EVALUATION COMPLEXITY: Low    GOALS: Short term PT Goals Target date: 02/14/2023 Pt will be I and compliant with HEP. Baseline:  Goal status: MET 01/31/23 Pt will decrease pain by 25% overall Baseline: Goal status: On-going 01/31/23  Long term PT goals Target date: 03/14/2023 Pt will improve Rt shoulder ER to [facility] to improve functional reaching Baseline: Goal status: Ongoing 01/25/23:  Pt will improve Rt shoulder strength to 1 MMT grade higher for functional strength Baseline: Goal status: MET 01/25/23 Pt will improve FOTO to at least 79% functional to show improved function Baseline: Goal status: Ongoing 01/25/23:  Pt  will reduce pain to overall less than 3/10 with usual activity and work activity. Baseline: Goal status: Ongoing 01/25/23:  5. Pt will be able to lift 50lbs for work without any issues.       Baseline:              Goal status:Ongoing 01/25/23:   PLAN: PT FREQUENCY: 1x per week   PT DURATION: 8 weeks  PLANNED INTERVENTIONS (unless contraindicated): aquatic PT, Canalith repositioning, cryotherapy, Electrical stimulation, Iontophoresis with 4 mg/ml dexamethasome, Moist heat, traction, Ultrasound, gait training, Therapeutic exercise, balance training, neuromuscular re-education, patient/family education, prosthetic training, manual techniques, passive ROM, dry needling, taping, vasopnuematic device, vestibular, spinal manipulations, joint manipulations  PLAN FOR NEXT SESSION:  assess dry needling response. Strengthen parascapular muscles, work on postural strengthening    Sharmon Leyden, PT, MPT 01/31/2023, 8:54 AM

## 2023-02-07 ENCOUNTER — Telehealth: Payer: Self-pay | Admitting: Physical Therapy

## 2023-02-07 ENCOUNTER — Encounter: Payer: Commercial Managed Care - PPO | Admitting: Physical Therapy

## 2023-02-07 ENCOUNTER — Ambulatory Visit: Payer: Self-pay | Admitting: Internal Medicine

## 2023-02-07 NOTE — Telephone Encounter (Signed)
I called to follow up with pt after she did not show for her 8:00 Physical Therapy appointment. I left a message reminding pt of her upcoming appointment next Tuesday 02/14/23 at 8:45am.   Kearney Hard, PT, MPT 02/07/23 8:22 AM

## 2023-02-14 ENCOUNTER — Encounter: Payer: Commercial Managed Care - PPO | Admitting: Physical Therapy

## 2023-02-15 ENCOUNTER — Other Ambulatory Visit (HOSPITAL_COMMUNITY): Payer: Self-pay

## 2023-02-15 DIAGNOSIS — F902 Attention-deficit hyperactivity disorder, combined type: Secondary | ICD-10-CM | POA: Diagnosis not present

## 2023-02-15 DIAGNOSIS — F419 Anxiety disorder, unspecified: Secondary | ICD-10-CM | POA: Diagnosis not present

## 2023-02-15 MED ORDER — SERTRALINE HCL 100 MG PO TABS
100.0000 mg | ORAL_TABLET | Freq: Every day | ORAL | 1 refills | Status: DC
  Filled 2023-02-15: qty 90, 90d supply, fill #0
  Filled 2023-05-18: qty 90, 90d supply, fill #1

## 2023-02-17 ENCOUNTER — Other Ambulatory Visit (HOSPITAL_COMMUNITY): Payer: Self-pay

## 2023-02-21 ENCOUNTER — Encounter: Payer: Self-pay | Admitting: Physical Therapy

## 2023-02-21 ENCOUNTER — Ambulatory Visit: Payer: Commercial Managed Care - PPO | Admitting: Physical Therapy

## 2023-02-21 DIAGNOSIS — M6281 Muscle weakness (generalized): Secondary | ICD-10-CM

## 2023-02-21 DIAGNOSIS — R293 Abnormal posture: Secondary | ICD-10-CM | POA: Diagnosis not present

## 2023-02-21 DIAGNOSIS — M25611 Stiffness of right shoulder, not elsewhere classified: Secondary | ICD-10-CM

## 2023-02-21 DIAGNOSIS — G8929 Other chronic pain: Secondary | ICD-10-CM | POA: Diagnosis not present

## 2023-02-21 DIAGNOSIS — M25511 Pain in right shoulder: Secondary | ICD-10-CM | POA: Diagnosis not present

## 2023-02-21 DIAGNOSIS — M542 Cervicalgia: Secondary | ICD-10-CM

## 2023-02-21 NOTE — Therapy (Signed)
OUTPATIENT PHYSICAL THERAPY TREATMENT NOTE   Patient Name: HAWI BUTLER MRN: [id number] DOB:Oct 04, 2001, 22 y.o., female Today's Date: 02/21/2023  END OF SESSION:  PT End of Session - 02/21/23 0843     Visit Number 7    Number of Visits 12    Date for PT Re-Evaluation 03/10/23    PT Start Time 0841    PT Stop Time 0920    PT Time Calculation (min) 39 min    Activity Tolerance Patient tolerated treatment well    Behavior During Therapy [facility] for tasks assessed/performed               Past Medical History:  Diagnosis Date   ADHD (attention deficit hyperactivity disorder)    Anxiety    Past Surgical History:  Procedure Laterality Date   MYRINGOTOMY WITH TUBE PLACEMENT     TONSILLECTOMY     tubes     wisdom teeth     Patient Active Problem List   Diagnosis Date Noted   Vitamin B12 deficiency 05/10/2018   B12 deficiency 02/08/2018    PCP: Holland Commons, FNP   REFERRING PROVIDER: Donella Stade, PA-C  REFERRING DIAG: Cervical spine pain [M54.2]   THERAPY DIAG:  Abnormal posture  Cervicalgia  Chronic right shoulder pain  Stiffness of right shoulder, not elsewhere classified  Muscle weakness (generalized)  Rationale for Evaluation and Treatment: Rehabilitation  ONSET DATE: A few years.   SUBJECTIVE:                                                                                                                                                                                                         SUBJECTIVE STATEMENT: pt stating her pain this morning was 8/10 in her Rt shoulder.   Eval:  Pt reports to PT with cervical and shoulder pain. She states that when she was 12 she started playing volleyball and was taught how to hit ball incorrectly. Pt reports poor technique resulting in intermittent shoulder and neck pain. She has been in a sling intermittently. She states that over the years, the pain has gotten progressively worse. Pt reports  having a bulging disc and continued pain today.   PERTINENT HISTORY:  ADHD, Anxiety   PAIN:  Are you having pain? Yes: NPRS scale: 5/10 Pain location: R shoulder  Pain description: Sharp pain, numbness into fingers.  Aggravating factors: Laying on it, or overuse.  Relieving factors: Rest  PRECAUTIONS: None  WEIGHT BEARING RESTRICTIONS: No  FALLS:  Has patient fallen in last 6 months? 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     OBJECTIVE:   DIAGNOSTIC FINDINGS:  Cervical:  1. Small central disc protrusion at C4-5 without significant stenosis or cord impingement. 2. Shallow right paracentral disc protrusion at C5-6 without significant stenosis or cord impingement. 3. Otherwise unremarkable MRI of the cervical spine. Shoulder:  1. No evidence of labral tear. Rounded contour of the posterior labrum, which could be a variant anatomy or mild degenerative changes. 2.  Rotator cuff tendons are intact. 3.  No evidence of fracture or osteonecrosis.  PATIENT SURVEYS:  FOTO 73.51%, 79 in 20 visits.   COGNITION: Overall cognitive status: Within functional limits for tasks assessed  SENSATION: WFL  POSTURE: rounded shoulders and forward head  PALPATION: None   CERVICAL ROM:   Active ROM A/PROM (deg) eval  Flexion WFL  Extension WFL  Right lateral flexion WFL  Left lateral flexion WFL  Right rotation WFL  Left rotation WFL   (Blank rows = not tested)  UPPER EXTREMITY ROM:  Active ROM Right eval Left eval  Shoulder flexion [facility]  Shoulder internal rotation [facility]  Shoulder external rotation Base of skull  C5   (Blank rows = not tested)  UPPER EXTREMITY MMT:  MMT Right eval Left eval Rt/Left 01/25/23  Shoulder  flexion 3+ 4- 4/5 / 5/5  Shoulder internal rotation 4+ 4+ 5/5 / 5/5  Shoulder external rotation 4+ 4+ 5/5 / 5/5  Middle trapezius 4- 4-   Lower trapezius 4- 4-   Grip strength WFL WFL    (Blank rows = not tested)  CERVICAL SPECIAL TESTS:  Upper limb tension test (ULTT): Positive  TODAY'S TREATMENT:  02/21/2022: TherEx:  UBE: Level 2 x 3 minutes each direction Rows: Level 4 band  2 x 15  Shoulder Ext l3# bar x 20 Shoulder stability: rolling ball on wall x 30 circles each direction on each UE Lat pull downs: 2 x 15 10# Manual:  active trigger point release and skilled palpation during DN STM to Rt shoulder Trigger Point Dry-Needling  Treatment instructions: Expect mild to moderate muscle soreness. S/S of pneumothorax if dry needled over a lung field, and to seek immediate medical attention should they occur. Patient verbalized understanding of these instructions and education.  Patient Consent Given: Yes Education handout provided: Previously provided Muscles treated: Rt middle and anterior deltoid,  Electrical stimulation performed: No Treatment response/outcome: multiple twitches elicited, Decreased tension and pain    01/31/2022: TherEx:  Rows: Level 4 band x 20 Shoulder Ext level 4 band x 20 Prone: lifting arm over 2 small cones 2 x 5  Prone in in 1/2 plank: pushing up from on elbows to full elbow extension x 5 leading with each UE Supine: shoulder flexion with 2# bar with 4# weight around bar x 20  Shoulder stability: rolling ball on wall x 30 circles each direction on each UE Manual:  active trigger point release and skilled palpation during DN STM to Rt shoulder Trigger Point Dry-Needling  Treatment instructions: Expect mild to moderate muscle soreness. S/S of pneumothorax if dry needled over a lung field, and to seek immediate medical attention should they occur. Patient verbalized understanding of these instructions and education.  Patient Consent Given:  Yes Education handout provided: Previously provided Muscles treated: Rt  middle and anterior deltoid, Rt bicep Electrical stimulation performed: No Treatment response/outcome: multiple twitches elicited, Decreased tension and pain   01/25/2022: TherEx:  UBE 2/2 fwd/ bkwd, 6 min Cervical retraction x 10 holding 10 sec c chin tucks Shoulder extension on BATCA 5 # each UE 2 x 15 Rows on BATCA 10# each UE, 2 x 15 Lat pull downs 15# 2 x 15 Shoulder stability: rolling ball on wall x 30 circles each direction on each UE Manual:  active trigger point release and skilled palpation during DN Trigger Point Dry-Needling  Treatment instructions: Expect mild to moderate muscle soreness. S/S of pneumothorax if dry needled over a lung field, and to seek immediate medical attention should they occur. Patient verbalized understanding of these instructions and education.  Patient Consent Given: No Education handout provided: Previously provided Muscles treated: bilateral cervical paraspinals  Electrical stimulation performed: No Treatment response/outcome: multiple twitches elicited, Decreased tension and pain     Manual:  active trigger point release and skilled palpation during DN Trigger Point Dry-Needling  Treatment instructions: Expect mild to moderate muscle soreness. S/S of pneumothorax if dry needled over a lung field, and to seek immediate medical attention should they occur. Patient verbalized understanding of these instructions and education.  Patient Consent Given: Yes Education handout provided: Previously provided Muscles treated: Bilateral deltoids (anterior, medial, posterior), left tricep Electrical stimulation performed: No Treatment response/outcome: Decreased tension and pain                                                                                                                                 PATIENT EDUCATION:  Education details: Educated pt on anatomy and  physiology of current symptoms, FOTO, diagnosis, prognosis, HEP,  and POC. Person educated: Patient Education method: Customer service manager Education comprehension: verbalized understanding and returned demonstration  HOME EXERCISE PROGRAM: Access Code: [email or web address] URL: [email or web address] Date: 01/11/2023 Prepared by: Kearney Hard  Exercises - Seated Scapular Retraction  - 2 x daily - 7 x weekly - 3 sets - 10 reps - 2 hold - Seated Levator Scapulae Stretch  - 2 x daily - 7 x weekly - 3 sets - 30 hold - Seated Upper Trapezius Stretch  - 2 x daily - 7 x weekly - 3 sets - 30 hold - Standing Bilateral Low Shoulder Row with Anchored Resistance  - 2 x daily - 7 x weekly - 2 sets - 15 reps - 2-3 seconds hold - Shoulder External Rotation and Scapular Retraction with Resistance  - 2 x daily - 7 x weekly - 2 sets - 15 reps - Shoulder extension with resistance - Neutral  - 2 x daily - 7 x weekly - 2 sets - 15 reps - 2-3 seconds hold - Horizontal Wall Walk with Resistance  - 1 x daily - 7 x weekly - 10 reps  ASSESSMENT:  CLINICAL IMPRESSION:  Pt arriving today reporting 2/10 pain in her Rt shoulder and  Rt cervical spine. Pt stated she hit her arm on her car door over the weekend and her pain shot up to 9/10. Pt tolerating exercises well. Pt with good response to DN today. Continue skilled PT to maximize pt's function.     OBJECTIVE IMPAIRMENTS: decreased activity tolerance, decreased shoulder mobility, decreased ROM, decreased strength, impaired flexibility, impaired UE use, postural dysfunction, and pain.  ACTIVITY LIMITATIONS: reaching, lifting, carry,  cleaning, driving, and or occupation  PERSONAL FACTORS:  also affecting patient's functional outcome.  REHAB POTENTIAL: Good  CLINICAL DECISION MAKING: Stable/uncomplicated  EVALUATION COMPLEXITY: Low    GOALS: Short term PT Goals Target date: 03/07/2023 Pt will be I and compliant with HEP. Baseline:  Goal  status: MET 01/31/23 Pt will decrease pain by 25% overall Baseline: Goal status: MET 02/21/23  Long term PT goals Target date: 04/04/2023 Pt will improve Rt shoulder ER to [facility] to improve functional reaching Baseline: Goal status: Ongoing 01/25/23:  Pt will improve Rt shoulder strength to 1 MMT grade higher for functional strength Baseline: Goal status: MET 01/25/23 Pt will improve FOTO to at least 79% functional to show improved function Baseline: Goal status: Ongoing 01/25/23:  Pt will reduce pain to overall less than 3/10 with usual activity and work activity. Baseline: Goal status: Ongoing 01/25/23:  5. Pt will be able to lift 50lbs for work without any issues.       Baseline:              Goal status:Ongoing 01/25/23:   PLAN: PT FREQUENCY: 1x per week   PT DURATION: 8 weeks  PLANNED INTERVENTIONS (unless contraindicated): aquatic PT, Canalith repositioning, cryotherapy, Electrical stimulation, Iontophoresis with 4 mg/ml dexamethasome, Moist heat, traction, Ultrasound, gait training, Therapeutic exercise, balance training, neuromuscular re-education, patient/family education, prosthetic training, manual techniques, passive ROM, dry needling, taping, vasopnuematic device, vestibular, spinal manipulations, joint manipulations  PLAN FOR NEXT SESSION:  assess dry needling response. Strengthen parascapular muscles, work on postural strengthening, mobs as needed    Oretha Caprice, PT, MPT 02/21/2023, 9:19 AM

## 2023-03-07 ENCOUNTER — Encounter: Payer: Commercial Managed Care - PPO | Admitting: Physical Therapy

## 2023-03-09 ENCOUNTER — Encounter: Payer: Self-pay | Admitting: Internal Medicine

## 2023-03-09 ENCOUNTER — Ambulatory Visit: Payer: Commercial Managed Care - PPO | Admitting: Physical Therapy

## 2023-03-09 ENCOUNTER — Other Ambulatory Visit (INDEPENDENT_AMBULATORY_CARE_PROVIDER_SITE_OTHER): Payer: Commercial Managed Care - PPO

## 2023-03-09 ENCOUNTER — Ambulatory Visit (INDEPENDENT_AMBULATORY_CARE_PROVIDER_SITE_OTHER): Payer: Commercial Managed Care - PPO | Admitting: Internal Medicine

## 2023-03-09 ENCOUNTER — Other Ambulatory Visit (HOSPITAL_COMMUNITY): Payer: Self-pay

## 2023-03-09 ENCOUNTER — Encounter: Payer: Self-pay | Admitting: Physical Therapy

## 2023-03-09 VITALS — BP 104/76 | HR 105 | Ht 60.0 in | Wt 186.2 lb

## 2023-03-09 DIAGNOSIS — R1011 Right upper quadrant pain: Secondary | ICD-10-CM

## 2023-03-09 DIAGNOSIS — G8929 Other chronic pain: Secondary | ICD-10-CM | POA: Diagnosis not present

## 2023-03-09 DIAGNOSIS — M25511 Pain in right shoulder: Secondary | ICD-10-CM

## 2023-03-09 DIAGNOSIS — R293 Abnormal posture: Secondary | ICD-10-CM | POA: Diagnosis not present

## 2023-03-09 DIAGNOSIS — K760 Fatty (change of) liver, not elsewhere classified: Secondary | ICD-10-CM | POA: Diagnosis not present

## 2023-03-09 DIAGNOSIS — M542 Cervicalgia: Secondary | ICD-10-CM | POA: Diagnosis not present

## 2023-03-09 DIAGNOSIS — M25611 Stiffness of right shoulder, not elsewhere classified: Secondary | ICD-10-CM

## 2023-03-09 DIAGNOSIS — M6281 Muscle weakness (generalized): Secondary | ICD-10-CM | POA: Diagnosis not present

## 2023-03-09 DIAGNOSIS — R160 Hepatomegaly, not elsewhere classified: Secondary | ICD-10-CM | POA: Diagnosis not present

## 2023-03-09 LAB — HEPATIC FUNCTION PANEL
ALT: 211 U/L — ABNORMAL HIGH (ref 0–35)
AST: 246 U/L — ABNORMAL HIGH (ref 0–37)
Albumin: 4.1 g/dL (ref 3.5–5.2)
Alkaline Phosphatase: 115 U/L (ref 39–117)
Bilirubin, Direct: 0.1 mg/dL (ref 0.0–0.3)
Total Bilirubin: 0.3 mg/dL (ref 0.2–1.2)
Total Protein: 8 g/dL (ref 6.0–8.3)

## 2023-03-09 MED ORDER — OMEPRAZOLE 20 MG PO CPDR
20.0000 mg | DELAYED_RELEASE_CAPSULE | Freq: Every day | ORAL | 3 refills | Status: DC
  Filled 2023-03-09: qty 90, 90d supply, fill #0

## 2023-03-09 MED ORDER — OMEPRAZOLE 20 MG PO CPDR
20.0000 mg | DELAYED_RELEASE_CAPSULE | Freq: Every day | ORAL | 3 refills | Status: DC
  Filled 2023-03-09: qty 30, 30d supply, fill #0

## 2023-03-09 NOTE — Therapy (Addendum)
OUTPATIENT PHYSICAL THERAPY TREATMENT NOTE DISCHARGE SUMMARY   Patient Name: Leslie [name] MRN: [id number] DOB:2001/06/21, 22 y.o., female Today's Date: 03/09/2023  END OF SESSION:  PT End of Session - 03/09/23 1518     Visit Number 8    Number of Visits 12    Date for PT Re-Evaluation 03/10/23    PT Start Time 1515    PT Stop Time 1553    PT Time Calculation (min) 38 min    Activity Tolerance Patient tolerated treatment well    Behavior During Therapy [facility] for tasks assessed/performed                Past Medical History:  Diagnosis Date   ADHD (attention deficit hyperactivity disorder)    Anxiety    Past Surgical History:  Procedure Laterality Date   MYRINGOTOMY WITH TUBE PLACEMENT     TONSILLECTOMY     tubes     wisdom teeth     Patient Active Problem List   Diagnosis Date Noted   Vitamin B12 deficiency 05/10/2018   B12 deficiency 02/08/2018    PCP: Fatima Sanger, FNP   REFERRING PROVIDER: Julieanne Cotton, PA-C  REFERRING DIAG: Cervical spine pain [M54.2]   THERAPY DIAG:  Abnormal posture  Cervicalgia  Chronic right shoulder pain  Stiffness of right shoulder, not elsewhere classified  Muscle weakness (generalized)  Rationale for Evaluation and Treatment: Rehabilitation  ONSET DATE: A few years.   SUBJECTIVE:                                                                                                                                                                                                         SUBJECTIVE STATEMENT:   Rt shoulder and neck are a little uncomfortable  Eval:  Pt reports to PT with cervical and shoulder pain. She states that when she was 12 she started playing volleyball and was taught how to hit ball incorrectly. Pt reports poor technique resulting in intermittent shoulder and neck pain. She has been in a sling intermittently. She states that over the years, the pain has gotten progressively worse. Pt  reports having a bulging disc and continued pain today.   PERTINENT HISTORY:  ADHD, Anxiety   PAIN:  Are you having pain? Yes: NPRS scale: 2/10 Pain location: R shoulder  Pain description: Sharp pain, numbness into fingers.  Aggravating factors: Laying on it, or overuse.  Relieving factors: Rest  PRECAUTIONS: None  WEIGHT BEARING RESTRICTIONS: No  FALLS:  Has patient fallen in last 6 months? No  LIVING  ENVIRONMENT: Lives with: lives with their family Lives in: House/apartment Stairs: No Has following equipment at home: None  OCCUPATION: Psychologist, sport and exercise as [employer]  PLOF: Independent  PATIENT GOALS: Pt would like to use her R shoulder without continued symptoms.     OBJECTIVE:   DIAGNOSTIC FINDINGS:  Cervical:  1. Small central disc protrusion at C4-5 without significant stenosis or cord impingement. 2. Shallow right paracentral disc protrusion at C5-6 without significant stenosis or cord impingement. 3. Otherwise unremarkable MRI of the cervical spine. Shoulder:  1. No evidence of labral tear. Rounded contour of the posterior labrum, which could be a variant anatomy or mild degenerative changes. 2.  Rotator cuff tendons are intact. 3.  No evidence of fracture or osteonecrosis.  PATIENT SURVEYS:  FOTO 73.51%, 79 in 20 visits.   COGNITION: Overall cognitive status: Within functional limits for tasks assessed  SENSATION: WFL  POSTURE: rounded shoulders and forward head  PALPATION: None   CERVICAL ROM:   Active ROM A/PROM (deg) eval  Flexion WFL  Extension WFL  Right lateral flexion WFL  Left lateral flexion WFL  Right rotation WFL  Left rotation WFL   (Blank rows = not tested)  UPPER EXTREMITY ROM:  Active ROM Right eval Left eval  Shoulder flexion [facility]  Shoulder internal rotation [facility]  Shoulder external rotation Base of skull  C5   (Blank rows = not tested)  UPPER EXTREMITY MMT:  MMT Right eval Left eval Rt/Left 01/25/23   Shoulder flexion 3+ 4- 4/5 / 5/5  Shoulder internal rotation 4+ 4+ 5/5 / 5/5  Shoulder external rotation 4+ 4+ 5/5 / 5/5  Middle trapezius 4- 4-   Lower trapezius 4- 4-   Grip strength WFL WFL    (Blank rows = not tested)  CERVICAL SPECIAL TESTS:  Upper limb tension test (ULTT): Positive  TODAY'S TREATMENT:  03/09/23 TherEx:  UBE: Level 3 x 3 minutes each direction Prone thoracic extension x 10 reps Prone W back x 10 reps Prone scapular retraction with arms down at side x 10 reps Seated horizontal abduction with L3 band x 10 reps Seated ER with scap retraction with L3 band x 10 reps Lat pull downs 10# 2x10 Rows 10# 2x10 Ball on wall circles x 20 reps each direction (2# ball) Standing shoulder flexion and abduction to 90 deg; 2# bil 2x10 Overhead press 2# 2x10 Bent over reverse flys 2x10; 2# Plank at counter with alternating shoulder taps x 20 bil Counter push up x 20 reps   Manual:  active trigger point release and skilled palpation during DN STM to Rt shoulder Trigger Point Dry-Needling  Treatment instructions: Expect mild to moderate muscle soreness. S/S of pneumothorax if dry needled over a lung field, and to seek immediate medical attention should they occur. Patient verbalized understanding of these instructions and education.  Patient Consent Given: Yes Education handout provided: Previously provided Muscles treated: Rt oblique capitus, suboccipitals, cervical paraspinals and upper trap Electrical stimulation performed: No Treatment response/outcome: multiple twitches elicited, Decreased tension and pain  02/21/2022: TherEx:  UBE: Level 2 x 3 minutes each direction Rows: Level 4 band  2 x 15  Shoulder Ext l3# bar x 20 Shoulder stability: rolling ball on wall x 30 circles each direction on each UE Lat pull downs: 2 x 15 10# Manual:  active trigger point release and skilled palpation during DN STM to Rt shoulder Trigger Point Dry-Needling  Treatment  instructions: Expect mild to moderate muscle soreness. S/S of  pneumothorax if dry needled over a lung field, and to seek immediate medical attention should they occur. Patient verbalized understanding of these instructions and education.  Patient Consent Given: Yes Education handout provided: Previously provided Muscles treated: Rt middle and anterior deltoid,  Electrical stimulation performed: No Treatment response/outcome: multiple twitches elicited, Decreased tension and pain    01/31/2022: TherEx:  Rows: Level 4 band x 20 Shoulder Ext level 4 band x 20 Prone: lifting arm over 2 small cones 2 x 5  Prone in in 1/2 plank: pushing up from on elbows to full elbow extension x 5 leading with each UE Supine: shoulder flexion with 2# bar with 4# weight around bar x 20  Shoulder stability: rolling ball on wall x 30 circles each direction on each UE Manual:  active trigger point release and skilled palpation during DN STM to Rt shoulder Trigger Point Dry-Needling  Treatment instructions: Expect mild to moderate muscle soreness. S/S of pneumothorax if dry needled over a lung field, and to seek immediate medical attention should they occur. Patient verbalized understanding of these instructions and education.  Patient Consent Given: Yes Education handout provided: Previously provided Muscles treated: Rt middle and anterior deltoid, Rt bicep Electrical stimulation performed: No Treatment response/outcome: multiple twitches elicited, Decreased tension and pain   01/25/2022: TherEx:  UBE 2/2 fwd/ bkwd, 6 min Cervical retraction x 10 holding 10 sec c chin tucks Shoulder extension on BATCA 5 # each UE 2 x 15 Rows on BATCA 10# each UE, 2 x 15 Lat pull downs 15# 2 x 15 Shoulder stability: rolling ball on wall x 30 circles each direction on each UE Manual:  active trigger point release and skilled palpation during DN Trigger Point Dry-Needling  Treatment instructions: Expect mild to moderate  muscle soreness. S/S of pneumothorax if dry needled over a lung field, and to seek immediate medical attention should they occur. Patient verbalized understanding of these instructions and education.  Patient Consent Given: No Education handout provided: Previously provided Muscles treated: bilateral cervical paraspinals  Electrical stimulation performed: No Treatment response/outcome: multiple twitches elicited, Decreased tension and pain     Manual:  active trigger point release and skilled palpation during DN Trigger Point Dry-Needling  Treatment instructions: Expect mild to moderate muscle soreness. S/S of pneumothorax if dry needled over a lung field, and to seek immediate medical attention should they occur. Patient verbalized understanding of these instructions and education.  Patient Consent Given: Yes Education handout provided: Previously provided Muscles treated: Bilateral deltoids (anterior, medial, posterior), left tricep Electrical stimulation performed: No Treatment response/outcome: Decreased tension and pain                                                                                                                                 PATIENT EDUCATION:  Education details: Educated pt on anatomy and physiology of current symptoms, FOTO, diagnosis, prognosis, HEP,  and POC. Person educated: Patient Education method: Explanation  and Demonstration Education comprehension: verbalized understanding and returned demonstration  HOME EXERCISE PROGRAM: Access Code: [email or web address] URL: [email or web address] Date: 01/11/2023 Prepared by: Narda Amber  Exercises - Seated Scapular Retraction  - 2 x daily - 7 x weekly - 3 sets - 10 reps - 2 hold - Seated Levator Scapulae Stretch  - 2 x daily - 7 x weekly - 3 sets - 30 hold - Seated Upper Trapezius Stretch  - 2 x daily - 7 x weekly - 3 sets - 30 hold - Standing Bilateral Low Shoulder Row with Anchored  Resistance  - 2 x daily - 7 x weekly - 2 sets - 15 reps - 2-3 seconds hold - Shoulder External Rotation and Scapular Retraction with Resistance  - 2 x daily - 7 x weekly - 2 sets - 15 reps - Shoulder extension with resistance - Neutral  - 2 x daily - 7 x weekly - 2 sets - 15 reps - 2-3 seconds hold - Horizontal Wall Walk with Resistance  - 1 x daily - 7 x weekly - 10 reps  ASSESSMENT:  CLINICAL IMPRESSION:  Pt tolerated session well reporting no pain following DN/manual today.  Progressed strengthening today with good response but expected fatigue.  Will continue to benefit from PT to maximize function.  OBJECTIVE IMPAIRMENTS: decreased activity tolerance, decreased shoulder mobility, decreased ROM, decreased strength, impaired flexibility, impaired UE use, postural dysfunction, and pain.  ACTIVITY LIMITATIONS: reaching, lifting, carry,  cleaning, driving, and or occupation  PERSONAL FACTORS:  also affecting patient's functional outcome.  REHAB POTENTIAL: Good  CLINICAL DECISION MAKING: Stable/uncomplicated  EVALUATION COMPLEXITY: Low    GOALS: Short term PT Goals Target date: 03/23/2023 Pt will be I and compliant with HEP. Baseline:  Goal status: MET 01/31/23 Pt will decrease pain by 25% overall Baseline: Goal status: MET 02/21/23  Long term PT goals Target date: 04/20/2023 Pt will improve Rt shoulder ER to [facility] to improve functional reaching Baseline: Goal status: Ongoing 01/25/23:  Pt will improve Rt shoulder strength to 1 MMT grade higher for functional strength Baseline: Goal status: MET 01/25/23 Pt will improve FOTO to at least 79% functional to show improved function Baseline: Goal status: Ongoing 01/25/23:  Pt will reduce pain to overall less than 3/10 with usual activity and work activity. Baseline: Goal status: Ongoing 01/25/23:  5. Pt will be able to lift 50lbs for work without any issues.       Baseline:              Goal status:Ongoing 01/25/23:   PLAN: PT  FREQUENCY: 1x per week   PT DURATION: 8 weeks  PLANNED INTERVENTIONS (unless contraindicated): aquatic PT, Canalith repositioning, cryotherapy, Electrical stimulation, Iontophoresis with 4 mg/ml dexamethasome, Moist heat, traction, Ultrasound, gait training, Therapeutic exercise, balance training, neuromuscular re-education, patient/family education, prosthetic training, manual techniques, passive ROM, dry needling, taping, vasopnuematic device, vestibular, spinal manipulations, joint manipulations  PLAN FOR NEXT SESSION:  will need recert next visit; assess dry needling response. Strengthen parascapular muscles, work on postural strengthening, mobs as needed    Clarita Crane, PT, DPT 03/09/23 3:53 PM   PHYSICAL THERAPY DISCHARGE SUMMARY  Visits from Start of Care: 8  Current functional level related to goals / functional outcomes: See above   Remaining deficits: See above; otherwise unknown   Education / Equipment: HEP, DN   Patient agrees to discharge. Patient goals were partially met. Patient is being discharged due to not returning since the last visit.    Judeth Cornfield  Maudry Mayhew, PT, DPT 04/11/23 12:23 PM  [facility] [address] [place], [place], [address] Phone: [phone]   Fax:  [phone]

## 2023-03-09 NOTE — Patient Instructions (Addendum)
Your provider has requested that you go to the basement level for lab work before leaving today. Press "B" on the elevator. The lab is located at the first door on the left as you exit the elevator.   You are schedule for follow up visit on 06/22/23 at 2:10 pm  We have sent the following medications to your pharmacy for you to pick up at your convenience: Omeprazole 20 mg take 1 capsule daily  _______________________________________________________  If your blood pressure at your visit was 140/90 or greater, please contact your primary care physician to follow up on this.  _______________________________________________________  If you are age 24 or older, your body mass index should be between 23-30. Your Body mass index is 36.37 kg/m. If this is out of the aforementioned range listed, please consider follow up with your Primary Care Provider.  If you are age 70 or younger, your body mass index should be between 19-25. Your Body mass index is 36.37 kg/m. If this is out of the aformentioned range listed, please consider follow up with your Primary Care Provider.   ________________________________________________________  The [facility] providers would like to encourage you to use [facility] to communicate with providers for non-urgent requests or questions.  Due to long hold times on the telephone, sending your provider a message by [facility] may be a faster and more efficient way to get a response.  Please allow 48 business hours for a response.  Please remember that this is for non-urgent requests.  _______________________________________________________   Due to recent changes in healthcare laws, you may see the results of your imaging and laboratory studies on MyChart before your provider has had a chance to review them.  We understand that in some cases there may be results that are confusing or concerning to you. Not all laboratory results come back in the same time frame and the provider may  be waiting for multiple results in order to interpret others.  Please give [place] 48 hours in order for your provider to thoroughly review all the results before contacting the office for clarification of your results.    Thank you for entrusting me with your care and for choosing [facility], Dr. Christia Reading

## 2023-03-09 NOTE — Progress Notes (Signed)
Chief Complaint: Fatty liver  HPI : 22 year old female with history of ADHD and anxiety presents for follow-up of hepatomegaly and fatty liver  Interval History: She has not been in any pain. She has lost about 10 lbs over the last few months.  She had to figure out what she wanted to cut down in her diet, which has aided in her weight loss. She had one alcoholic beverage last month but otherwise has not had any other alcohol consumption for the last several months. Denies swelling, confusion, or blood in stools. She does still have some RUQ ab pain. 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  Wt Readings from Last 3 Encounters:  03/09/23 186 lb 4 oz (84.5 kg)  12/07/22 186 lb 9.6 oz (84.6 kg)  07/06/22 193 lb (87.5 kg)   Current Outpatient Medications  Medication Sig Dispense Refill   cetirizine (ZYRTEC) 10 MG chewable tablet      Cholecalciferol (VITAMIN D3) 2000 units TABS Take 2,000 Units by mouth daily.     Clindamycin-Benzoyl Per, Refr, gel Apply a small amount to skin topically in the morning. 45 g 11   Cranberry 1000 MG CAPS      drospirenone-ethinyl estradiol (YASMIN) 3-0.03 MG tablet Take 1 tablet by mouth once a day 84 tablet 5   lisdexamfetamine (VYVANSE) 30 MG capsule Take 1 capsule (30 mg total) by mouth in the morning. 90 capsule 0   sertraline (ZOLOFT) 100 MG tablet Take 1 tablet (100 mg total) by mouth daily. 90 tablet 1   tretinoin (RETIN-A) 0.05 % cream Apply 1 application (a small amount) topically to skin every night 20 g 2   tretinoin (RETIN-A) 0.05 % cream Apply 1 application (a small amount) topically every evening. 20 g 11   vitamin B-12 (CYANOCOBALAMIN) 1000 MCG tablet Take 1,000 mcg by mouth daily.     No current facility-administered medications for this visit.   Physical Exam: BP 104/76   Pulse (!) 105   Ht 5' (1.524 m)   Wt 186 lb 4 oz (84.5 kg)   SpO2 95%   BMI 36.37 kg/m  Constitutional: Pleasant,well-developed, female in no  acute distress. HEENT: Normocephalic and atraumatic. Conjunctivae are normal. No scleral icterus. Cardiovascular: Normal rate, regular rhythm.  Pulmonary/chest: Effort normal and breath sounds normal. No wheezing, rales or rhonchi. Abdominal: Soft, nondistended, mildly tender in the RUQ. Bowel sounds active throughout. There are no masses palpable. No hepatomegaly. Extremities: No edema Neurological: Alert and oriented to person place and time. Skin: Skin is warm and dry. No rashes noted. Psychiatric: Normal mood and affect. Behavior is normal.  Labs 09/2022: CBC with elevated WBC of 11.2 and nml platelets. Iron sat low at 11.2%. Ferritin 126.   Labs 10/2022: CMP with elevated AST of 48 and elevated ALT 63.  Labs 11/2022: ANA mildly elevated at 1:80. Hep A antibody positive. Hep B surface antibody positive. Hepatitis B surface antigen negative. HCV antibody negative. Ceruloplasmin nml. ASMA negative. IgG nml. INR nml. TTG IgA negative. IgA nml.   CT A/P w/contrast 10/26/22: IMPRESSION: 1. Ill-defined heterogeneous area in the anterior lower pole of the right kidney. This may represent pyelonephritis. Recommend correlation with urinalysis. If there is no clinical or laboratory evidence of urinary tract infection, recommend further assessment with MRI to exclude ill-defined lesion. 2. Hepatomegaly and advanced hepatic steatosis. 3. Normal appendix.  MR Abdomen w/contrast 11/20/22: IMPRESSION: 1. No signal abnormality or abnormal contrast enhancement of the inferior pole  of the right kidney to correspond to finding of prior CT. This may have reflected since resolved pyelonephritis. 2. Severe hepatic steatosis.  RUQ U/S with elastograpy 12/12/22: IMPRESSION: ULTRASOUND RUQ: Severe hepatic steatosis without acute findings. ULTRASOUND HEPATIC ELASTOGRAPHY: Median kPa:  7.8 Diagnostic category: < or = 9 kPa: in the absence of other known clinical signs, rules out cACLD  ASSESSMENT  AND PLAN: Fatty liver Hepatomegaly Elevated LFTs RUQ ab pain Patient presents with fatty liver with some persistent right upper quadrant abdominal pain, suspected to be due to steatohepatitis.  Her lab workup was only meaningful for a mildly elevated ANA with titer of 1:80.  Patient has made meaningful efforts to lose weight and reduce alcohol consumption.  Her recent ultrasound with elastography showed severe hepatic steatosis but she does not have any signs of advanced fibrosis thus far.  I will plan to recheck her LFTs today and start her on some omeprazole to see if this helps with her abdominal pain since she does have a history of duodenal ulcer as a child - Check LFTs - Encourage weight loss - Encourage 2 cups of coffee consumption per day - Start omeprazole 20 mg QD - RTC in 3 months  Christia Reading, MD  I spent 38 minutes of time, including in depth chart review, independent review of results as outlined above, communicating results with the patient directly, face-to-face time with the patient, coordinating care, ordering studies and medications as appropriate, and documentation.

## 2023-03-10 ENCOUNTER — Other Ambulatory Visit: Payer: Self-pay

## 2023-03-10 ENCOUNTER — Other Ambulatory Visit (HOSPITAL_COMMUNITY): Payer: Self-pay

## 2023-03-10 DIAGNOSIS — R7989 Other specified abnormal findings of blood chemistry: Secondary | ICD-10-CM

## 2023-03-10 NOTE — Progress Notes (Signed)
Hi Leslie [name], let's plan to have her come back for repeat labs in 2 weeks since her LFTs are higher than in the past. Let's get LFTs, ANA, ASMA, and IgG at that time. Thanks.

## 2023-03-13 ENCOUNTER — Other Ambulatory Visit (HOSPITAL_COMMUNITY): Payer: Self-pay

## 2023-03-13 ENCOUNTER — Encounter: Payer: Self-pay | Admitting: Nurse Practitioner

## 2023-03-14 ENCOUNTER — Encounter: Payer: Commercial Managed Care - PPO | Admitting: Physical Therapy

## 2023-03-15 ENCOUNTER — Emergency Department (HOSPITAL_BASED_OUTPATIENT_CLINIC_OR_DEPARTMENT_OTHER): Payer: Commercial Managed Care - PPO

## 2023-03-15 ENCOUNTER — Emergency Department (HOSPITAL_BASED_OUTPATIENT_CLINIC_OR_DEPARTMENT_OTHER)
Admission: EM | Admit: 2023-03-15 | Discharge: 2023-03-16 | Disposition: A | Discharge status: Home / Self Care | Payer: Commercial Managed Care - PPO | Attending: Emergency Medicine | Admitting: Emergency Medicine

## 2023-03-15 ENCOUNTER — Other Ambulatory Visit: Payer: Self-pay

## 2023-03-15 DIAGNOSIS — K760 Fatty (change of) liver, not elsewhere classified: Secondary | ICD-10-CM

## 2023-03-15 DIAGNOSIS — R1010 Upper abdominal pain, unspecified: Secondary | ICD-10-CM

## 2023-03-15 DIAGNOSIS — K700 Alcoholic fatty liver: Secondary | ICD-10-CM | POA: Diagnosis not present

## 2023-03-15 DIAGNOSIS — R7989 Other specified abnormal findings of blood chemistry: Secondary | ICD-10-CM | POA: Insufficient documentation

## 2023-03-15 LAB — COMPREHENSIVE METABOLIC PANEL
ALT: 120 U/L — ABNORMAL HIGH (ref 0–44)
AST: 55 U/L — ABNORMAL HIGH (ref 15–41)
Albumin: 4.4 g/dL (ref 3.5–5.0)
Alkaline Phosphatase: 113 U/L (ref 38–126)
Anion gap: 10 (ref 5–15)
BUN: 15 mg/dL (ref 6–20)
CO2: 24 mmol/L (ref 22–32)
Calcium: 9.6 mg/dL (ref 8.9–10.3)
Chloride: 103 mmol/L (ref 98–111)
Creatinine, Ser: 0.63 mg/dL (ref 0.44–1.00)
GFR, Estimated: >60 mL/min (ref >60)
Glucose, Bld: 104 mg/dL — ABNORMAL HIGH (ref 70–99)
Potassium: 3.5 mmol/L (ref 3.5–5.1)
Sodium: 137 mmol/L (ref 135–145)
Total Bilirubin: 0.4 mg/dL (ref 0.3–1.2)
Total Protein: 7.7 g/dL (ref 6.5–8.1)

## 2023-03-15 LAB — URINALYSIS, ROUTINE W REFLEX MICROSCOPIC
Bilirubin Urine: NEGATIVE
Glucose, UA: NEGATIVE mg/dL
Hgb urine dipstick: NEGATIVE
Ketones, ur: NEGATIVE mg/dL
Leukocytes,Ua: NEGATIVE
Nitrite: NEGATIVE
Protein, ur: NEGATIVE mg/dL
Specific Gravity, Urine: 1.026 (ref 1.005–1.030)
pH: 6.5 (ref 5.0–8.0)

## 2023-03-15 LAB — LIPASE, BLOOD: Lipase: 11 U/L (ref 11–51)

## 2023-03-15 LAB — PREGNANCY, URINE: Preg Test, Ur: NEGATIVE

## 2023-03-15 LAB — CBC
HCT: 40.4 % (ref 36.0–46.0)
Hemoglobin: 13.9 g/dL (ref 12.0–15.0)
MCH: 28.8 pg (ref 26.0–34.0)
MCHC: 34.4 g/dL (ref 30.0–36.0)
MCV: 83.6 fL (ref 80.0–100.0)
Platelets: 314 10*3/uL (ref 150–400)
RBC: 4.83 MIL/uL (ref 3.87–5.11)
RDW: 12.8 % (ref 11.5–15.5)
WBC: 14.1 10*3/uL — ABNORMAL HIGH (ref 4.0–10.5)
nRBC: 0 % (ref 0.0–0.2)

## 2023-03-15 MED ORDER — PANTOPRAZOLE SODIUM 40 MG IV SOLR
40.0000 mg | Freq: Once | INTRAVENOUS | Status: AC
  Administered 2023-03-15: 40 mg via INTRAVENOUS
  Filled 2023-03-15: qty 10

## 2023-03-15 MED ORDER — LACTATED RINGERS IV BOLUS
1000.0000 mL | Freq: Once | INTRAVENOUS | Status: AC
  Administered 2023-03-15: 1000 mL via INTRAVENOUS

## 2023-03-15 MED ORDER — ONDANSETRON HCL 4 MG/2ML IJ SOLN
4.0000 mg | Freq: Once | INTRAMUSCULAR | Status: AC
  Administered 2023-03-15: 4 mg via INTRAVENOUS
  Filled 2023-03-15: qty 2

## 2023-03-15 MED ORDER — IOHEXOL 300 MG/ML  SOLN
100.0000 mL | Freq: Once | INTRAMUSCULAR | Status: AC | PRN
  Administered 2023-03-15: 85 mL via INTRAVENOUS

## 2023-03-15 MED ORDER — ONDANSETRON HCL 4 MG/2ML IJ SOLN
4.0000 mg | Freq: Once | INTRAMUSCULAR | Status: AC | PRN
  Administered 2023-03-15: 4 mg via INTRAVENOUS
  Filled 2023-03-15: qty 2

## 2023-03-15 MED ORDER — HYDROMORPHONE HCL 1 MG/ML IJ SOLN
0.5000 mg | Freq: Once | INTRAMUSCULAR | Status: AC
  Administered 2023-03-15: 0.5 mg via INTRAVENOUS
  Filled 2023-03-15: qty 1

## 2023-03-15 NOTE — ED Triage Notes (Signed)
Arrives POV from home. Aox4. Tearful.  Reports abdominal pain on and off x3 days- worsening today. Has been working with GI recently for possible ulcer and fatty liver workup. +N/-V

## 2023-03-15 NOTE — Discharge Instructions (Signed)
It was our pleasure to provide your ER care today - we hope that you feel better.  Drink plenty of fluids/stay well hydrated. Continue prilosec.   Follow up closely with your doctor/gi doctor in the coming week.  Return to ER if worse, new symptoms, fevers, worsening or severe abdominal pain, persistent vomiting, or other concern.  You were given pain meds in the ER - no driving for the next 6 hours.

## 2023-03-15 NOTE — ED Provider Notes (Signed)
[facility] Provider Note   CSN: [id number] Arrival date & time: 03/15/23  2049     History  Chief Complaint  Patient presents with   Abdominal Pain    Leslie [name] is a 22 y.o. female.  Patient with c/o upper abd pain in past three days. Symptoms acute onset, moderate, persistent, dull, non radiating. No hx gallstones or pancreatitis. No back/flank pain. No lower abd or pelvic pain. No dysuria or hematuria. Nausea. No vomiting. Having normal bms. No chest pain or sob. Last period last week, normal. No vaginal discharge or bleeding.   The history is provided by the patient and medical records.  Abdominal Pain Associated symptoms: nausea   Associated symptoms: no chest pain, no chills, no cough, no dysuria, no fever, no shortness of breath, no sore throat, no vaginal bleeding, no vaginal discharge and no vomiting        Home Medications Prior to Admission medications   Medication Sig Start Date End Date Taking? Authorizing Provider  cetirizine (ZYRTEC) 10 MG chewable tablet     [provider]  Cholecalciferol (VITAMIN D3) 2000 units TABS Take 2,000 Units by mouth daily.    [provider]  Clindamycin-Benzoyl Per, Refr, gel Apply a small amount to skin topically in the morning. 06/01/22     Cranberry 1000 MG CAPS     [provider]  drospirenone-ethinyl estradiol (YASMIN) 3-0.03 MG tablet Take 1 tablet by mouth once a day 01/10/23     lisdexamfetamine (VYVANSE) 30 MG capsule Take 1 capsule (30 mg total) by mouth in the morning. 08/15/22     omeprazole (PRILOSEC) 20 MG capsule Take 1 capsule (20 mg total) by mouth daily. 03/09/23   Sharyn Creamer, MD  sertraline (ZOLOFT) 100 MG tablet Take 1 tablet (100 mg total) by mouth daily. 02/15/23     tretinoin (RETIN-A) 0.05 % cream Apply 1 application (a small amount) topically to skin every night 03/01/22     tretinoin (RETIN-A) 0.05 % cream Apply 1 application (a  small amount) topically every evening. 06/01/22     vitamin B-12 (CYANOCOBALAMIN) 1000 MCG tablet Take 1,000 mcg by mouth daily.    [provider]      Allergies    Amoxicillin-pot clavulanate    Review of Systems   Review of Systems  Constitutional:  Negative for chills and fever.  HENT:  Negative for sore throat.   Eyes:  Negative for redness.  Respiratory:  Negative for cough and shortness of breath.   Cardiovascular:  Negative for chest pain.  Gastrointestinal:  Positive for abdominal pain and nausea. Negative for vomiting.  Genitourinary:  Negative for dysuria, flank pain, vaginal bleeding and vaginal discharge.  Musculoskeletal:  Negative for back pain and neck pain.  Skin:  Negative for rash.  Neurological:  Negative for headaches.  Hematological:  Does not bruise/bleed easily.  Psychiatric/Behavioral:  Negative for confusion.     Physical Exam Updated Vital Signs BP 114/74   Pulse 85   Temp 97.7 F (36.5 C)   Resp 16   Ht 1.524 m (5')   Wt 83.9 kg   LMP 03/13/2023 (Exact Date)   SpO2 97%   BMI 36.13 kg/m  Physical Exam Vitals and nursing note reviewed.  Constitutional:      Appearance: Normal appearance. She is well-developed.  HENT:     Head: Atraumatic.     Nose: Nose normal.     Mouth/Throat:  Mouth: Mucous membranes are moist.  Eyes:     General: No scleral icterus.    Conjunctiva/sclera: Conjunctivae normal.  Neck:     Trachea: No tracheal deviation.  Cardiovascular:     Rate and Rhythm: Normal rate and regular rhythm.     Pulses: Normal pulses.     Heart sounds: Normal heart sounds. No murmur heard.    No friction rub. No gallop.  Pulmonary:     Effort: Pulmonary effort is normal. No respiratory distress.     Breath sounds: Normal breath sounds.  Abdominal:     General: Bowel sounds are normal. There is no distension.     Palpations: Abdomen is soft.     Tenderness: There is abdominal tenderness. There is no guarding.      Comments: Epigastric and upper abd tenderness.   Genitourinary:    Comments: No cva tenderness.  Musculoskeletal:        General: No swelling.     Cervical back: Normal range of motion and neck supple. No rigidity. No muscular tenderness.  Skin:    General: Skin is warm and dry.     Findings: No rash.  Neurological:     Mental Status: She is alert.     Comments: Alert, speech normal.   Psychiatric:        Mood and Affect: Mood normal.     ED Results / Procedures / Treatments   Labs (all labs ordered are listed, but only abnormal results are displayed) Results for orders placed or performed during the hospital encounter of 03/15/23  Lipase, blood  Result Value Ref Range   Lipase 11 11 - 51 U/L  Comprehensive metabolic panel  Result Value Ref Range   Sodium 137 135 - 145 mmol/L   Potassium 3.5 3.5 - 5.1 mmol/L   Chloride 103 98 - 111 mmol/L   CO2 24 22 - 32 mmol/L   Glucose, Bld 104 (H) 70 - 99 mg/dL   BUN 15 6 - 20 mg/dL   Creatinine, Ser 0.63 0.44 - 1.00 mg/dL   Calcium 9.6 8.9 - 10.3 mg/dL   Total Protein 7.7 6.5 - 8.1 g/dL   Albumin 4.4 3.5 - 5.0 g/dL   AST 55 (H) 15 - 41 U/L   ALT 120 (H) 0 - 44 U/L   Alkaline Phosphatase 113 38 - 126 U/L   Total Bilirubin 0.4 0.3 - 1.2 mg/dL   GFR, Estimated >60 >60 mL/min   Anion gap 10 5 - 15  CBC  Result Value Ref Range   WBC 14.1 (H) 4.0 - 10.5 K/uL   RBC 4.83 3.87 - 5.11 MIL/uL   Hemoglobin 13.9 12.0 - 15.0 g/dL   HCT 40.4 36.0 - 46.0 %   MCV 83.6 80.0 - 100.0 fL   MCH 28.8 26.0 - 34.0 pg   MCHC 34.4 30.0 - 36.0 g/dL   RDW 12.8 11.5 - 15.5 %   Platelets 314 150 - 400 K/uL   nRBC 0.0 0.0 - 0.2 %  Urinalysis, Routine w reflex microscopic -Urine, Clean Catch  Result Value Ref Range   Color, Urine YELLOW YELLOW   APPearance CLEAR CLEAR   Specific Gravity, Urine 1.026 1.005 - 1.030   pH 6.5 5.0 - 8.0   Glucose, UA NEGATIVE NEGATIVE mg/dL   Hgb urine dipstick NEGATIVE NEGATIVE   Bilirubin Urine NEGATIVE NEGATIVE    Ketones, ur NEGATIVE NEGATIVE mg/dL   Protein, ur NEGATIVE NEGATIVE mg/dL   Nitrite NEGATIVE NEGATIVE  Leukocytes,Ua NEGATIVE NEGATIVE  Pregnancy, urine  Result Value Ref Range   Preg Test, Ur NEGATIVE NEGATIVE      EKG None  Radiology No results found.  Procedures Procedures    Medications Ordered in ED Medications  ondansetron (ZOFRAN) injection 4 mg (4 mg Intravenous Given 03/15/23 2111)  lactated ringers bolus 1,000 mL (1,000 mLs Intravenous New Bag/Given 03/15/23 2300)  HYDROmorphone (DILAUDID) injection 0.5 mg (0.5 mg Intravenous Given 03/15/23 2304)  ondansetron (ZOFRAN) injection 4 mg (4 mg Intravenous Given 03/15/23 2300)  pantoprazole (PROTONIX) injection 40 mg (40 mg Intravenous Given 03/15/23 2303)    ED Course/ Medical Decision Making/ A&P                             Medical Decision Making Problems Addressed: Elevated liver function tests: chronic illness or injury that poses a threat to life or bodily functions Hepatic steatosis: chronic illness or injury that poses a threat to life or bodily functions Upper abdominal pain: acute illness or injury with systemic symptoms that poses a threat to life or bodily functions  Amount and/or Complexity of Data Reviewed External Data Reviewed: labs, radiology and notes. Labs: ordered. Decision-making details documented in ED Course. Radiology: ordered and independent interpretation performed. Decision-making details documented in ED Course.  Risk Prescription drug management. Parenteral controlled substances. Decision regarding hospitalization.   Iv ns. Continuous pulse ox and cardiac monitoring. Labs ordered/sent. Imaging ordered.   Differential diagnosis includes gastritis, pancreatitis, biliary colic, etc . Dispo decision including potential need for admission considered - will get labs and imaging and reassess.   Reviewed nursing notes and prior charts for additional history. External reports reviewed. Prior  egd with gastritis, duodenitis.   Cardiac monitor: sinus rhythm, rate 84.  Labs reviewed/interpreted by me - lipase normal. Preg neg. Wbc elev.   CT reviewed/interpreted by me - pnd.   2310, Ct pending, signed out to oncoming EDP, Dr  Karle Starch,  to check ct, recheck and dispo appropriately.           Final Clinical Impression(s) / ED Diagnoses Final diagnoses:  None    Rx / DC Orders ED Discharge Orders     None         Lajean Saver, MD 03/15/23 2310

## 2023-03-15 NOTE — ED Notes (Signed)
Patient back from CT.

## 2023-03-16 ENCOUNTER — Encounter: Payer: Self-pay | Admitting: Internal Medicine

## 2023-03-16 ENCOUNTER — Other Ambulatory Visit (HOSPITAL_COMMUNITY): Payer: Self-pay

## 2023-03-16 MED ORDER — ALUM & MAG HYDROXIDE-SIMETH 200-200-20 MG/5ML PO SUSP
30.0000 mL | Freq: Once | ORAL | Status: AC
  Administered 2023-03-16: 30 mL via ORAL
  Filled 2023-03-16: qty 30

## 2023-03-16 NOTE — ED Provider Notes (Signed)
Care of the patient assumed at the change of shift. History of fatty liver and elevated LFTs, followed by GI. Here today for epigastric pain. Labs showed mild leukocytosis and improved LFTs. Pending CT.  Physical Exam  BP 114/74   Pulse 85   Temp 97.7 F (36.5 C)   Resp 16   Ht 5' (1.524 m)   Wt 83.9 kg   LMP 03/13/2023 (Exact Date)   SpO2 97%   BMI 36.13 kg/m   Physical Exam  Procedures  Procedures  ED Course / MDM   Clinical Course as of 03/16/23 0011  Thu Mar 16, 2023  0009 I personally viewed the images from radiology studies and agree with radiologist interpretation:  CT is neg for acute process. Patient's pain is improved. She has no peritoneal signs on re-exam. Had PUD as a teenager. Will give GI cocktail, continue PPI at home and follow up with GI for further evaluation. RTED for worsening.  [CS]    Clinical Course User Index [CS] Truddie Hidden, MD   Medical Decision Making Amount and/or Complexity of Data Reviewed Labs: ordered. Radiology: ordered.  Risk Prescription drug management.         Truddie Hidden, MD 03/16/23 [phone]

## 2023-03-23 ENCOUNTER — Encounter: Payer: Commercial Managed Care - PPO | Admitting: Physical Therapy

## 2023-03-23 ENCOUNTER — Telehealth: Payer: Self-pay | Admitting: Physical Therapy

## 2023-03-23 NOTE — Telephone Encounter (Signed)
LVM for pt as she did not show for her PT appt today.  No additional appts scheduled so left number to call if she needs to make additional appts.  Laureen Abrahams, PT, DPT 03/23/23 8:18 AM

## 2023-03-28 ENCOUNTER — Other Ambulatory Visit: Payer: Self-pay

## 2023-03-28 ENCOUNTER — Other Ambulatory Visit (HOSPITAL_COMMUNITY): Payer: Self-pay

## 2023-03-28 MED ORDER — SUCRALFATE 1 G PO TABS
1.0000 g | ORAL_TABLET | Freq: Three times a day (TID) | ORAL | 0 refills | Status: DC | PRN
  Filled 2023-03-28: qty 90, 30d supply, fill #0

## 2023-04-13 ENCOUNTER — Other Ambulatory Visit: Payer: Self-pay

## 2023-04-18 ENCOUNTER — Other Ambulatory Visit (INDEPENDENT_AMBULATORY_CARE_PROVIDER_SITE_OTHER): Payer: Commercial Managed Care - PPO

## 2023-04-18 ENCOUNTER — Encounter: Payer: Self-pay | Admitting: Internal Medicine

## 2023-04-18 ENCOUNTER — Ambulatory Visit: Payer: Commercial Managed Care - PPO | Admitting: Internal Medicine

## 2023-04-18 VITALS — BP 106/72 | HR 87 | Ht 60.0 in | Wt 191.0 lb

## 2023-04-18 DIAGNOSIS — R160 Hepatomegaly, not elsewhere classified: Secondary | ICD-10-CM | POA: Diagnosis not present

## 2023-04-18 DIAGNOSIS — R7989 Other specified abnormal findings of blood chemistry: Secondary | ICD-10-CM

## 2023-04-18 DIAGNOSIS — R1013 Epigastric pain: Secondary | ICD-10-CM | POA: Diagnosis not present

## 2023-04-18 LAB — HEPATIC FUNCTION PANEL
ALT: 102 U/L — ABNORMAL HIGH (ref 0–35)
AST: 76 U/L — ABNORMAL HIGH (ref 0–37)
Albumin: 4.3 g/dL (ref 3.5–5.2)
Alkaline Phosphatase: 113 U/L (ref 39–117)
Bilirubin, Direct: 0.1 mg/dL (ref 0.0–0.3)
Total Bilirubin: 0.4 mg/dL (ref 0.2–1.2)
Total Protein: 7.7 g/dL (ref 6.0–8.3)

## 2023-04-18 NOTE — Progress Notes (Signed)
Chief Complaint: Fatty liver  HPI : 22 year old female with history of fatty liver, ADHD, prior PUD, and anxiety presents for follow-up of hepatomegaly and fatty liver  She is currently in nursing school  Interval History: She was in really bad pain and went to ED on 3/20 with epigastric ab pain.  This pain felt very similar to pain she had experienced in the past when she had PUD as a child. She used carafate for a week after her ED visit, and then the pain resolved. Last alcoholic drink was in 01/2023 when she had 1-2 alcoholic beverages. She has still been taking omeprazole 20 mg QD. Denies use of herbal supplements.  Wt Readings from Last 3 Encounters:  04/18/23 191 lb (86.6 kg)  03/15/23 185 lb (83.9 kg)  03/09/23 186 lb 4 oz (84.5 kg)   Current Outpatient Medications  Medication Sig Dispense Refill   cetirizine (ZYRTEC) 10 MG chewable tablet      Cholecalciferol (VITAMIN D3) 2000 units TABS Take 2,000 Units by mouth daily.     Clindamycin-Benzoyl Per, Refr, gel Apply a small amount to skin topically in the morning. 45 g 11   Cranberry 1000 MG CAPS      drospirenone-ethinyl estradiol (YASMIN) 3-0.03 MG tablet Take 1 tablet by mouth once a day 84 tablet 5   lisdexamfetamine (VYVANSE) 30 MG capsule Take 1 capsule (30 mg total) by mouth in the morning. 90 capsule 0   omeprazole (PRILOSEC) 20 MG capsule Take 1 capsule (20 mg total) by mouth daily. 30 capsule 3   sertraline (ZOLOFT) 100 MG tablet Take 1 tablet (100 mg total) by mouth daily. 90 tablet 1   sucralfate (CARAFATE) 1 g tablet Take 1 tablet (1 g total) by mouth 3 (three) times daily as needed. 90 tablet 0   tretinoin (RETIN-A) 0.05 % cream Apply 1 application (a small amount) topically to skin every night 20 g 2   tretinoin (RETIN-A) 0.05 % cream Apply 1 application (a small amount) topically every evening. 20 g 11   vitamin B-12 (CYANOCOBALAMIN) 1000 MCG tablet Take 1,000 mcg by mouth daily.     No current  facility-administered medications for this visit.   Physical Exam: BP 106/72   Pulse 87   Ht 5' (1.524 m)   Wt 191 lb (86.6 kg)   BMI 37.30 kg/m  Constitutional: Pleasant,well-developed, female in no acute distress. HEENT: Normocephalic and atraumatic. Conjunctivae are normal. No scleral icterus. Cardiovascular: Normal rate, regular rhythm.  Pulmonary/chest: Effort normal and breath sounds normal. No wheezing, rales or rhonchi. Abdominal: Soft, nondistended, tender in the epigastric area. Bowel sounds active throughout. There are no masses palpable. No hepatomegaly. Extremities: No edema Neurological: Alert and oriented to person place and time. Skin: Skin is warm and dry. No rashes noted. Psychiatric: Normal mood and affect. Behavior is normal.  Labs 09/2022: CBC with elevated WBC of 11.2 and nml platelets. Iron sat low at 11.2%. Ferritin 126.   Labs 10/2022: CMP with elevated AST of 48 and elevated ALT 63.  Labs 11/2022: ANA mildly elevated at 1:80. Hep A antibody positive. Hep B surface antibody positive. Hepatitis B surface antigen negative. HCV antibody negative. Ceruloplasmin nml. ASMA negative. IgG nml. INR nml. TTG IgA negative. IgA nml.   [facility] 02/2023: CMP with elevated AST 55 and ALT 120 (was as high as AST 246 and ALT 211). CBC with elevated WBC of 14.1. Lipase nml. Urine pregnancy test negative. U/A nml.   CT A/P  w/contrast 10/26/22: IMPRESSION: 1. Ill-defined heterogeneous area in the anterior lower pole of the right kidney. This may represent pyelonephritis. Recommend correlation with urinalysis. If there is no clinical or laboratory evidence of urinary tract infection, recommend further assessment with MRI to exclude ill-defined lesion. 2. Hepatomegaly and advanced hepatic steatosis. 3. Normal appendix.  MR Abdomen w/contrast 11/20/22: IMPRESSION: 1. No signal abnormality or abnormal contrast enhancement of the inferior pole of the right kidney to correspond to  finding of prior CT. This may have reflected since resolved pyelonephritis. 2. Severe hepatic steatosis.  RUQ U/S with elastograpy 12/12/22: IMPRESSION: ULTRASOUND RUQ: Severe hepatic steatosis without acute findings. ULTRASOUND HEPATIC ELASTOGRAPHY: Median kPa:  7.8 Diagnostic category: < or = 9 kPa: in the absence of other known clinical signs, rules out cACLD  CT A/P w/contrast 03/15/23: IMPRESSION: 1. Hepatomegaly and hepatic steatosis. 2. Small left adnexal cyst, likely ovarian in origin. No follow-up imaging is recommended. This recommendation follows ACR consensus  ASSESSMENT AND PLAN: Epigastric ab pain Fatty liver Hepatomegaly Elevated LFTs History of PUD Patient presents with follow-up for epigastric abdominal pain as well as fatty liver.  On recent LFTs, she was noted to have more significantly elevated AST and ALT in the 200s range.  Unclear what prompted this elevation LFTs since patient states that she has been avoiding alcohol use and attempting to lose weight.  Since patient has had a mildly elevated ANA in the past, will recheck her autoimmune hepatitis labs today and plan for a liver biopsy in the future to better delineate what the source of her elevated LFTs is due to.  Since patient does have history of gastric ulcer as a child, will plan for an EGD for further evaluation of her epigastric abdominal pain.  This abdominal pain seemed to improve on sucralfate therapy. - Check LFTs, ASMA, ANA, IgG - Encourage weight loss - Encourage 2 cups of coffee consumption per day - Cont omeprazole 20 mg QD - Liver biopsy - EGD LEC - Continue to avoid alcohol  Eulah Pont, MD  I spent 40 minutes of time, including in depth chart review, independent review of results as outlined above, communicating results with the patient directly, face-to-face time with the patient, coordinating care, ordering studies and medications as appropriate, and documentation.

## 2023-04-18 NOTE — Patient Instructions (Addendum)
[facility] will contact you for Liver biopsy ultrasound appointment (1st floor of hospital) please contact radiology at [phone]. This test typically takes about 30 minutes to perform.   You have been scheduled for an endoscopy. Please follow written instructions given to you at your visit today. If you use inhalers (even only as needed), please bring them with you on the day of your procedure.   Your provider has requested that you go to the basement level for lab work before leaving today. Press "B" on the elevator. The lab is located at the first door on the left as you exit the elevator.   _______________________________________________________  If your blood pressure at your visit was 140/90 or greater, please contact your primary care physician to follow up on this.  _______________________________________________________  If you are age 80 or older, your body mass index should be between 23-30. Your Body mass index is 37.3 kg/m. If this is out of the aforementioned range listed, please consider follow up with your Primary Care Provider.  If you are age 65 or younger, your body mass index should be between 19-25. Your Body mass index is 37.3 kg/m. If this is out of the aformentioned range listed, please consider follow up with your Primary Care Provider.   ________________________________________________________  The [facility] providers would like to encourage you to use [facility] to communicate with providers for non-urgent requests or questions.  Due to long hold times on the telephone, sending your provider a message by [facility] may be a faster and more efficient way to get a response.  Please allow 48 business hours for a response.  Please remember that this is for non-urgent requests.  _______________________________________________________   Due to recent changes in healthcare laws, you may see the results of your imaging and laboratory studies on MyChart before your  provider has had a chance to review them.  We understand that in some cases there may be results that are confusing or concerning to you. Not all laboratory results come back in the same time frame and the provider may be waiting for multiple results in order to interpret others.  Please give [place] 48 hours in order for your provider to thoroughly review all the results before contacting the office for clarification of your results.   Thank you for entrusting me with your care and for choosing [facility], Dr. Eulah Pont

## 2023-04-20 ENCOUNTER — Encounter: Payer: Commercial Managed Care - PPO | Admitting: Internal Medicine

## 2023-04-21 ENCOUNTER — Encounter: Payer: Self-pay | Admitting: Internal Medicine

## 2023-04-21 LAB — ANTI-SMOOTH MUSCLE ANTIBODY, IGG: Actin (Smooth Muscle) Antibody (IGG): <20 U (ref <20)

## 2023-04-21 LAB — ANTI-NUCLEAR AB-TITER (ANA TITER): ANA Titer 1: 1:80 {titer} — ABNORMAL HIGH

## 2023-04-21 LAB — ANA: Anti Nuclear Antibody (ANA): POSITIVE — AB

## 2023-04-21 LAB — IGG: IgG (Immunoglobin G), Serum: 1092 mg/dL (ref 600–1640)

## 2023-04-26 ENCOUNTER — Ambulatory Visit: Payer: Commercial Managed Care - PPO | Admitting: Physician Assistant

## 2023-04-26 HISTORY — PX: LIVER BIOPSY: SHX301

## 2023-05-01 ENCOUNTER — Other Ambulatory Visit: Payer: Self-pay | Admitting: Student

## 2023-05-01 DIAGNOSIS — K760 Fatty (change of) liver, not elsewhere classified: Secondary | ICD-10-CM

## 2023-05-02 ENCOUNTER — Encounter (HOSPITAL_COMMUNITY): Payer: Self-pay

## 2023-05-02 ENCOUNTER — Ambulatory Visit (HOSPITAL_COMMUNITY)
Admission: RE | Admit: 2023-05-02 | Discharge: 2023-05-02 | Disposition: A | Discharge status: Home / Self Care | Payer: Commercial Managed Care - PPO | Source: Ambulatory Visit | Attending: Internal Medicine | Admitting: Internal Medicine

## 2023-05-02 DIAGNOSIS — K7581 Nonalcoholic steatohepatitis (NASH): Secondary | ICD-10-CM | POA: Diagnosis not present

## 2023-05-02 DIAGNOSIS — R160 Hepatomegaly, not elsewhere classified: Secondary | ICD-10-CM | POA: Insufficient documentation

## 2023-05-02 DIAGNOSIS — Z8711 Personal history of peptic ulcer disease: Secondary | ICD-10-CM | POA: Diagnosis not present

## 2023-05-02 DIAGNOSIS — R1013 Epigastric pain: Secondary | ICD-10-CM | POA: Diagnosis not present

## 2023-05-02 DIAGNOSIS — R945 Abnormal results of liver function studies: Secondary | ICD-10-CM | POA: Diagnosis not present

## 2023-05-02 DIAGNOSIS — D7581 Myelofibrosis: Secondary | ICD-10-CM | POA: Diagnosis not present

## 2023-05-02 DIAGNOSIS — K7401 Hepatic fibrosis, early fibrosis: Secondary | ICD-10-CM | POA: Diagnosis not present

## 2023-05-02 DIAGNOSIS — R7989 Other specified abnormal findings of blood chemistry: Secondary | ICD-10-CM | POA: Diagnosis not present

## 2023-05-02 DIAGNOSIS — K760 Fatty (change of) liver, not elsewhere classified: Secondary | ICD-10-CM

## 2023-05-02 LAB — CBC
HCT: 39.2 % (ref 36.0–46.0)
Hemoglobin: 13.8 g/dL (ref 12.0–15.0)
MCH: 29.5 pg (ref 26.0–34.0)
MCHC: 35.2 g/dL (ref 30.0–36.0)
MCV: 83.8 fL (ref 80.0–100.0)
Platelets: 289 10*3/uL (ref 150–400)
RBC: 4.68 MIL/uL (ref 3.87–5.11)
RDW: 12.6 % (ref 11.5–15.5)
WBC: 9.8 10*3/uL (ref 4.0–10.5)
nRBC: 0 % (ref 0.0–0.2)

## 2023-05-02 LAB — PROTIME-INR
INR: 1 (ref 0.8–1.2)
Prothrombin Time: 13.1 seconds (ref 11.4–15.2)

## 2023-05-02 LAB — PREGNANCY, URINE: Preg Test, Ur: NEGATIVE

## 2023-05-02 MED ORDER — FENTANYL CITRATE (PF) 100 MCG/2ML IJ SOLN
INTRAMUSCULAR | Status: AC | PRN
  Administered 2023-05-02: 25 ug via INTRAVENOUS

## 2023-05-02 MED ORDER — OXYCODONE-ACETAMINOPHEN 5-325 MG PO TABS
ORAL_TABLET | ORAL | Status: AC
  Filled 2023-05-02: qty 1

## 2023-05-02 MED ORDER — FENTANYL CITRATE (PF) 100 MCG/2ML IJ SOLN
INTRAMUSCULAR | Status: AC
  Filled 2023-05-02: qty 2

## 2023-05-02 MED ORDER — OXYCODONE-ACETAMINOPHEN 5-325 MG PO TABS
1.0000 | ORAL_TABLET | Freq: Four times a day (QID) | ORAL | Status: DC | PRN
  Administered 2023-05-02: 1 via ORAL

## 2023-05-02 MED ORDER — GELATIN ABSORBABLE 12-7 MM EX MISC
1.0000 | Freq: Once | CUTANEOUS | Status: AC
  Administered 2023-05-02: 1 via TOPICAL

## 2023-05-02 MED ORDER — MIDAZOLAM HCL 2 MG/2ML IJ SOLN
INTRAMUSCULAR | Status: AC | PRN
  Administered 2023-05-02 (×2): 1 mg via INTRAVENOUS

## 2023-05-02 MED ORDER — MIDAZOLAM HCL 2 MG/2ML IJ SOLN
INTRAMUSCULAR | Status: AC
  Filled 2023-05-02: qty 2

## 2023-05-02 MED ORDER — SODIUM CHLORIDE 0.9 % IV SOLN: INTRAVENOUS | Status: DC

## 2023-05-02 MED ORDER — LIDOCAINE HCL (PF) 1 % IJ SOLN
8.0000 mL | Freq: Once | INTRAMUSCULAR | Status: AC
  Administered 2023-05-02: 8 mL via INTRADERMAL

## 2023-05-02 NOTE — H&P (Signed)
Chief Complaint: Patient was seen in consultation today for elevated liver functions at the request of Dorsey,Ying C  Referring Physician(s): Dorsey,Ying C  Supervising Physician: Mir, Mauri Reading  Patient Status: [name]  History of Present Illness: Leslie [name] is a 22 y.o. female   Hx Fatty liver; hepatomegaly Follows with Dr Leonides Schanz Epigastric pain and came to ED~2-3 months ago  CT 3/20//24: IMPRESSION: 1. Hepatomegaly and hepatic steatosis. 2. Small left adnexal cyst, likely ovarian in origin. No follow-up imaging is recommended.  Dr Leonides Schanz note 4/23:  ASSESSMENT AND PLAN: Epigastric ab pain Fatty liver Hepatomegaly Elevated LFTs History of PUD Patient presents with follow-up for epigastric abdominal pain as well as fatty liver.  On recent LFTs, she was noted to have more significantly elevated AST and ALT in the 200s range.  Unclear what prompted this elevation LFTs since patient states that she has been avoiding alcohol use and attempting to lose weight.  Since patient has had a mildly elevated ANA in the past, will recheck her autoimmune hepatitis labs today and plan for a liver biopsy in the future to better delineate what the source of her elevated LFTs is due to.  Since patient does have history of gastric ulcer as a child, will plan for an EGD for further evaluation of her epigastric abdominal pain.  This abdominal pain seemed to improve on sucralfate therapy. - Check LFTs, ASMA, ANA, IgG - Encourage weight loss - Encourage 2 cups of coffee consumption per day - Cont omeprazole 20 mg QD - Liver biopsy - EGD [facility]EC - Continue to avoid alcohol  Scheduled today for random core liver biopsy  Past Medical History:  Diagnosis Date   ADHD (attention deficit hyperactivity disorder)    Anxiety     Past Surgical History:  Procedure Laterality Date   MYRINGOTOMY WITH TUBE PLACEMENT     TONSILLECTOMY     tubes     wisdom teeth       Allergies: Amoxicillin-pot clavulanate  Medications: Prior to Admission medications   Medication Sig Start Date End Date Taking? Authorizing Provider  cetirizine (ZYRTEC) 10 MG chewable tablet    Yes [provider]  Cholecalciferol (VITAMIN D3) 2000 units TABS Take 2,000 Units by mouth daily.   Yes [provider]  Clindamycin-Benzoyl Per, Refr, gel Apply a small amount to skin topically in the morning. 06/01/22  Yes   Cranberry 1000 MG CAPS    Yes [provider]  lisdexamfetamine (VYVANSE) 30 MG capsule Take 1 capsule (30 mg total) by mouth in the morning. 08/15/22  Yes   omeprazole (PRILOSEC) 20 MG capsule Take 1 capsule (20 mg total) by mouth daily. 03/09/23  Yes Imogene Burn, MD  sertraline (ZOLOFT) 100 MG tablet Take 1 tablet (100 mg total) by mouth daily. 02/15/23  Yes   sucralfate (CARAFATE) 1 g tablet Take 1 tablet (1 g total) by mouth 3 (three) times daily as needed. 03/28/23  Yes Imogene Burn, MD  tretinoin (RETIN-A) 0.05 % cream Apply 1 application (a small amount) topically to skin every night 03/01/22  Yes   tretinoin (RETIN-A) 0.05 % cream Apply 1 application (a small amount) topically every evening. 06/01/22  Yes   vitamin B-12 (CYANOCOBALAMIN) 1000 MCG tablet Take 1,000 mcg by mouth daily.   Yes [provider]  drospirenone-ethinyl estradiol (YASMIN) 3-0.03 MG tablet Take 1 tablet by mouth once a day 01/10/23        Family History  Adopted: Yes  Social History   Socioeconomic History   Marital status: Single    Spouse name: Not on file   Number of children: 0   Years of education: 12   Highest education level: Not on file  Occupational History   Occupation: Consulting civil engineer  Tobacco Use   Smoking status: Never   Smokeless tobacco: Never  Vaping Use   Vaping Use: Never used  Substance and Sexual Activity   Alcohol use: Yes    Comment: rare   Drug use: No   Sexual activity: Not Currently    Birth control/protection: Pill     Comment: intercourse age 65, less than sexual partnes  Other Topics Concern   Not on file  Social History Narrative   Lives at home with parents.   Right-handed.   2 sodas per day.       Social Determinants of Health   Financial Resource Strain: Not on file  Food Insecurity: Not on file  Transportation Needs: Not on file  Physical Activity: Not on file  Stress: Not on file  Social Connections: Not on file    Review of Systems: A 12 point ROS discussed and pertinent positives are indicated in the HPI above.  All other systems are negative.  Review of Systems  Constitutional:  Negative for activity change, appetite change, fatigue, fever and unexpected weight change.  Respiratory:  Negative for cough and shortness of breath.   Cardiovascular:  Negative for chest pain.  Gastrointestinal:  Positive for abdominal pain. Negative for nausea and vomiting.  Musculoskeletal:  Negative for back pain.  Psychiatric/Behavioral:  Negative for behavioral problems and confusion.     Vital Signs: BP 113/83   Pulse 84   Temp 98.9 F (37.2 C) (Oral)   Resp 18   Ht 5' (1.524 m)   Wt 189 lb (85.7 kg)   LMP 04/05/2023 (Approximate)   SpO2 96%   BMI 36.91 kg/m     Physical Exam Vitals reviewed.  HENT:     Mouth/Throat:     Mouth: Mucous membranes are moist.  Cardiovascular:     Rate and Rhythm: Normal rate and regular rhythm.     Heart sounds: Normal heart sounds.  Pulmonary:     Effort: Pulmonary effort is normal.     Breath sounds: Normal breath sounds.  Abdominal:     General: Bowel sounds are normal.     Palpations: Abdomen is soft.     Tenderness: There is abdominal tenderness.  Musculoskeletal:        General: Normal range of motion.  Skin:    General: Skin is warm.  Neurological:     Mental Status: She is alert and oriented to person, place, and time.  Psychiatric:        Behavior: Behavior normal.     Imaging: No results found.  Labs:  CBC: Recent Labs     10/12/22 1447 10/19/22 1112 03/15/23 2100 05/02/23 0624  WBC 11.3* 11.2* 14.1* 9.8  HGB 14.3 13.8 13.9 13.8  HCT 42.1 40.5 40.4 39.2  PLT 322 310 314 289    COAGS: Recent Labs    12/07/22 1055 05/02/23 0624  INR 0.9 1.0    BMP: Recent Labs    11/02/22 1451 03/15/23 2100  NA 138 137  K 3.8 3.5  CL 104 103  CO2 21 24  GLUCOSE 103* 104*  BUN 14 15  CALCIUM 9.5 9.6  CREATININE 0.73 0.63  GFRNONAA  --  >60    LIVER  FUNCTION TESTS: Recent Labs    11/02/22 1451 03/09/23 1435 03/15/23 2100 04/18/23 1103  BILITOT 0.4 0.3 0.4 0.4  AST 48* 246* 55* 76*  ALT 63* 211* 120* 102*  ALKPHOS  --  115 113 113  PROT 7.0 8.0 7.7 7.7  ALBUMIN  --  4.1 4.4 4.3    TUMOR MARKERS: No results for input(s): "AFPTM", "CEA", "CA199", "CHROMGRNA" in the last 8760 hours.  Assessment and Plan:  Elevated liver functions for few months Hx hepatomegaly and fatty liver follows with Dr Leonides Schanz Scheduled today for liver core biopsy Risks and benefits of liver core biopsy was discussed with the patient and/or patient's family including, but not limited to bleeding, infection, damage to adjacent structures or low yield requiring additional tests.  All of the questions were answered and there is agreement to proceed.  Consent signed and in chart.  Thank you for this interesting consult.  I greatly enjoyed meeting Leslie [name] and look forward to participating in their care.  A copy of this report was sent to the requesting provider on this date.  Electronically Signed: Robet Leu, PA-C 05/02/2023, 7:05 AM   I spent a total of  30 Minutes   in face to face in clinical consultation, greater than 50% of which was counseling/coordinating care for liver core biopsy

## 2023-05-02 NOTE — Procedures (Signed)
Interventional Radiology Procedure Note  Procedure: US guided random liver biopsy  Indication: Elevated LFT's  Findings: Please refer to procedural dictation for full description.  Complications: None  EBL: < 10 mL  Rian Koon, MD [phone]   

## 2023-05-04 LAB — SURGICAL PATHOLOGY

## 2023-05-10 ENCOUNTER — Ambulatory Visit (AMBULATORY_SURGERY_CENTER): Payer: Commercial Managed Care - PPO | Admitting: Internal Medicine

## 2023-05-10 ENCOUNTER — Other Ambulatory Visit (HOSPITAL_COMMUNITY): Payer: Self-pay

## 2023-05-10 ENCOUNTER — Encounter: Payer: Self-pay | Admitting: Internal Medicine

## 2023-05-10 VITALS — BP 99/60 | HR 75 | Temp 98.2°F | Resp 17 | Ht 60.0 in | Wt 191.0 lb

## 2023-05-10 DIAGNOSIS — K319 Disease of stomach and duodenum, unspecified: Secondary | ICD-10-CM | POA: Diagnosis not present

## 2023-05-10 DIAGNOSIS — K2970 Gastritis, unspecified, without bleeding: Secondary | ICD-10-CM

## 2023-05-10 DIAGNOSIS — R1013 Epigastric pain: Secondary | ICD-10-CM

## 2023-05-10 DIAGNOSIS — F419 Anxiety disorder, unspecified: Secondary | ICD-10-CM | POA: Diagnosis not present

## 2023-05-10 MED ORDER — OMEPRAZOLE 40 MG PO CPDR
40.0000 mg | DELAYED_RELEASE_CAPSULE | Freq: Two times a day (BID) | ORAL | 2 refills | Status: DC
  Filled 2023-05-10: qty 60, 30d supply, fill #0
  Filled 2023-06-25: qty 60, 30d supply, fill #1
  Filled 2023-08-27: qty 60, 30d supply, fill #2

## 2023-05-10 MED ORDER — SODIUM CHLORIDE 0.9 % IV SOLN
500.0000 mL | Freq: Once | INTRAVENOUS | Status: DC
Start: 2023-05-10 — End: 2023-05-10

## 2023-05-10 NOTE — Progress Notes (Signed)
Called to room to assist during endoscopic procedure.  Patient ID and intended procedure confirmed with present staff. Received instructions for my participation in the procedure from the performing physician.  

## 2023-05-10 NOTE — Progress Notes (Signed)
Report to PACU, RN, vss, BBS= Clear.  

## 2023-05-10 NOTE — Op Note (Signed)
[facility] Patient Name: Leslie [name] Procedure Date: 05/10/2023 9:47 AM MRN: [id number] Endoscopist: Particia Lather , , [id number] Age: 22 Referring MD:  Date of Birth: 2001/01/02 Gender: Female Account #: [id number] Procedure:                Upper GI endoscopy Indications:              Epigastric abdominal pain, Follow-up of peptic ulcer Medicines:                Monitored Anesthesia Care Procedure:                Pre-Anesthesia Assessment:                           - Prior to the procedure, a History and Physical                            was performed, and patient medications and                            allergies were reviewed. The patient's tolerance of                            previous anesthesia was also reviewed. The risks                            and benefits of the procedure and the sedation                            options and risks were discussed with the patient.                            All questions were answered, and informed consent                            was obtained. Prior Anticoagulants: The patient has                            taken no anticoagulant or antiplatelet agents. ASA                            Grade Assessment: II - A patient with mild systemic                            disease. After reviewing the risks and benefits,                            the patient was deemed in satisfactory condition to                            undergo the procedure.                           After obtaining informed consent, the endoscope was  passed under direct vision. Throughout the                            procedure, the patient's blood pressure, pulse, and                            oxygen saturations were monitored continuously. The                            Olympus Scope [id number] was introduced through the                            mouth, and advanced to the second part of duodenum.                             The upper GI endoscopy was accomplished without                            difficulty. The patient tolerated the procedure                            well. Scope In: Scope Out: Findings:                 The examined esophagus was normal.                           Localized mild inflammation characterized by                            erosions and erythema was found in the gastric                            antrum. Biopsies were taken with a cold forceps for                            histology.                           The examined duodenum was normal. Complications:            No immediate complications. Estimated Blood Loss:     Estimated blood loss was minimal. Impression:               - Normal esophagus.                           - Gastritis. Biopsied.                           - Normal examined duodenum. Recommendation:           - Discharge patient to home (with escort).                           - Await pathology results.                           -  Use Prilosec (omeprazole) 40 mg PO BID for 8                            weeks.                           - Return to GI clinic in 8 weeks.                           - The findings and recommendations were discussed                            with the patient. Dr Particia Lather "[place]" [place],  05/10/2023 10:07:02 AM

## 2023-05-10 NOTE — Patient Instructions (Addendum)
Thank you for coming in to see [place] today. Resume previous diet and medications today, increase Prilosec  to 40 mg twice per day for 8 weeks, prescription sent to your pharmacy . Return to regular daily activities tomorrow. Follow up appointment Thursday 08/17/2023 @ 8:30 am.  Please arrive 10 minutes early.    YOU HAD AN ENDOSCOPIC PROCEDURE TODAY AT THE [facility]:   Refer to the procedure report that was given to you for any specific questions about what was found during the examination.  If the procedure report does not answer your questions, please call your gastroenterologist to clarify.  If you requested that your care partner not be given the details of your procedure findings, then the procedure report has been included in a sealed envelope for you to review at your convenience later.  YOU SHOULD EXPECT: Some feelings of bloating in the abdomen. Passage of more gas than usual.  Walking can help get rid of the air that was put into your GI tract during the procedure and reduce the bloating. If you had a lower endoscopy (such as a colonoscopy or flexible sigmoidoscopy) you may notice spotting of blood in your stool or on the toilet paper. If you underwent a bowel prep for your procedure, you may not have a normal bowel movement for a few days.  Please Note:  You might notice some irritation and congestion in your nose or some drainage.  This is from the oxygen used during your procedure.  There is no need for concern and it should clear up in a day or so.  SYMPTOMS TO   Following upper endoscopy (EGD)  Vomiting of blood or coffee ground material  New chest pain or pain under the shoulder blades  Painful or persistently difficult swallowing  New shortness of breath  Fever of 100F or higher  Black, tarry-looking stools  For urgent or emergent issues, a gastroenterologist can be reached at any hour by calling [phone]. Do not use MyChart messaging for urgent concerns.     DIET:  We do recommend a small meal at first, but then you may proceed to your regular diet.  Drink plenty of fluids but you should avoid alcoholic beverages for 24 hours.  ACTIVITY:  You should plan to take it easy for the rest of today and you should NOT DRIVE or use heavy machinery until tomorrow (because of the sedation medicines used during the test).    FOLLOW UP: Our staff will call the number listed on your records the next business day following your procedure.  We will call around 7:15- 8:00 am to check on you and address any questions or concerns that you may have regarding the information given to you following your procedure. If we do not reach you, we will leave a message.     If any biopsies were taken you will be contacted by phone or by letter within the next 1-3 weeks.  Please call us at [phone] if you have not heard about the biopsies in 3 weeks.    SIGNATURES/CONFIDENTIALITY: You and/or your care partner have signed paperwork which will be entered into your electronic medical record.  These signatures attest to the fact that that the information above on your After Visit Summary has been reviewed and is understood.  Full responsibility of the confidentiality of this discharge information lies with you and/or your care-partner.

## 2023-05-10 NOTE — Progress Notes (Signed)
Pt's states no medical or surgical changes since previsit or office visit. 

## 2023-05-10 NOTE — Progress Notes (Signed)
GASTROENTEROLOGY PROCEDURE H&P NOTE   Primary Care Physician: Fatima Sanger, FNP    Reason for Procedure:   Epigastric ab pain, history of PUD  Plan:    EGD  Patient is appropriate for endoscopic procedure(s) in the ambulatory ([facility]) setting.  The nature of the procedure, as well as the risks, benefits, and alternatives were carefully and thoroughly reviewed with the patient. Ample time for discussion and questions allowed. The patient understood, was satisfied, and agreed to proceed.     HPI: Leslie [name] is a 22 y.o. female who presents for EGD for evaluation of epigastric ab pain and history of PUD .  Patient was most recently seen in the Gastroenterology Clinic on 04/18/23.  No interval change in medical history since that appointment. Please refer to that note for full details regarding GI history and clinical presentation.   Past Medical History:  Diagnosis Date   ADHD (attention deficit hyperactivity disorder)    Allergy    Anxiety     Past Surgical History:  Procedure Laterality Date   LIVER BIOPSY  04/2023   MYRINGOTOMY WITH TUBE PLACEMENT     TONSILLECTOMY     tubes     UPPER GASTROINTESTINAL ENDOSCOPY     wisdom teeth      Prior to Admission medications   Medication Sig Start Date End Date Taking? Authorizing Provider  cetirizine (ZYRTEC) 10 MG chewable tablet    Yes [provider]  Cholecalciferol (VITAMIN D3) 2000 units TABS Take 2,000 Units by mouth daily.   Yes [provider]  Cranberry 1000 MG CAPS    Yes [provider]  drospirenone-ethinyl estradiol (YASMIN) 3-0.03 MG tablet Take 1 tablet by mouth once a day 01/10/23  Yes   omeprazole (PRILOSEC) 20 MG capsule Take 1 capsule (20 mg total) by mouth daily. 03/09/23  Yes Imogene Burn, MD  sertraline (ZOLOFT) 100 MG tablet Take 1 tablet (100 mg total) by mouth daily. 02/15/23  Yes   vitamin B-12 (CYANOCOBALAMIN) 1000 MCG tablet Take 1,000 mcg by mouth daily.   Yes  [provider]  Clindamycin-Benzoyl Per, Refr, gel Apply a small amount to skin topically in the morning. 06/01/22     lisdexamfetamine (VYVANSE) 30 MG capsule Take 1 capsule (30 mg total) by mouth in the morning. 08/15/22     sucralfate (CARAFATE) 1 g tablet Take 1 tablet (1 g total) by mouth 3 (three) times daily as needed. 03/28/23   Imogene Burn, MD  tretinoin (RETIN-A) 0.05 % cream Apply 1 application (a small amount) topically to skin every night 03/01/22     tretinoin (RETIN-A) 0.05 % cream Apply 1 application (a small amount) topically every evening. 06/01/22       Current Outpatient Medications  Medication Sig Dispense Refill   cetirizine (ZYRTEC) 10 MG chewable tablet      Cholecalciferol (VITAMIN D3) 2000 units TABS Take 2,000 Units by mouth daily.     Cranberry 1000 MG CAPS      drospirenone-ethinyl estradiol (YASMIN) 3-0.03 MG tablet Take 1 tablet by mouth once a day 84 tablet 5   omeprazole (PRILOSEC) 20 MG capsule Take 1 capsule (20 mg total) by mouth daily. 30 capsule 3   sertraline (ZOLOFT) 100 MG tablet Take 1 tablet (100 mg total) by mouth daily. 90 tablet 1   vitamin B-12 (CYANOCOBALAMIN) 1000 MCG tablet Take 1,000 mcg by mouth daily.     Clindamycin-Benzoyl Per, Refr, gel Apply a small amount to  skin topically in the morning. 45 g 11   lisdexamfetamine (VYVANSE) 30 MG capsule Take 1 capsule (30 mg total) by mouth in the morning. 90 capsule 0   sucralfate (CARAFATE) 1 g tablet Take 1 tablet (1 g total) by mouth 3 (three) times daily as needed. 90 tablet 0   tretinoin (RETIN-A) 0.05 % cream Apply 1 application (a small amount) topically to skin every night 20 g 2   tretinoin (RETIN-A) 0.05 % cream Apply 1 application (a small amount) topically every evening. 20 g 11   Current Facility-Administered Medications  Medication Dose Route Frequency Provider Last Rate Last Admin   0.9 %  sodium chloride infusion  500 mL Intravenous Once Imogene Burn, MD        Allergies as  of 05/10/2023 - Review Complete 05/10/2023  Allergen Reaction Noted   Amoxicillin-pot clavulanate Diarrhea 07/01/2021    Family History  Adopted: Yes    Social History   Socioeconomic History   Marital status: Single    Spouse name: Not on file   Number of children: 0   Years of education: 12   Highest education level: Not on file  Occupational History   Occupation: Consulting civil engineer  Tobacco Use   Smoking status: Never   Smokeless tobacco: Never  Vaping Use   Vaping Use: Never used  Substance and Sexual Activity   Alcohol use: Yes    Comment: rare   Drug use: No   Sexual activity: Not Currently    Birth control/protection: Pill    Comment: intercourse age 27, less than sexual partnes  Other Topics Concern   Not on file  Social History Narrative   Lives at home with parents.   Right-handed.   2 sodas per day.       Social Determinants of Health   Financial Resource Strain: Not on file  Food Insecurity: Not on file  Transportation Needs: Not on file  Physical Activity: Not on file  Stress: Not on file  Social Connections: Not on file  Intimate Partner Violence: Not on file    Physical Exam: Vital signs in last 24 hours: BP (!) 103/55   Pulse 87   Temp 98.2 F (36.8 C) (Temporal)   Ht 5' (1.524 m)   Wt 191 lb (86.6 kg)   LMP 05/03/2023 (Exact Date)   SpO2 97%   BMI 37.30 kg/m  GEN: NAD EYE: Sclerae anicteric ENT: MMM CV: Non-tachycardic Pulm: No increased WOB GI: Soft NEURO:  Alert & Oriented   Eulah Pont, MD  Gastroenterology   05/10/2023 9:45 AM

## 2023-05-11 ENCOUNTER — Telehealth: Payer: Self-pay | Admitting: *Deleted

## 2023-05-11 NOTE — Telephone Encounter (Signed)
Post procedure follow up call placed, no answer and left VM.  

## 2023-05-12 ENCOUNTER — Encounter: Payer: Self-pay | Admitting: Internal Medicine

## 2023-06-03 ENCOUNTER — Encounter: Payer: Self-pay | Admitting: Internal Medicine

## 2023-06-09 DIAGNOSIS — H5213 Myopia, bilateral: Secondary | ICD-10-CM | POA: Diagnosis not present

## 2023-06-22 ENCOUNTER — Ambulatory Visit: Payer: Commercial Managed Care - PPO | Admitting: Internal Medicine

## 2023-07-12 ENCOUNTER — Ambulatory Visit: Payer: 59 | Admitting: Nurse Practitioner

## 2023-07-12 NOTE — Progress Notes (Deleted)
   Leslie [name] 2001/10/17 [id number]   History:  22 y.o. G0 presents for annual exam without GYN complaints. Monthly cycle/OCPs. Dermatology switched her to Yasmin for acne management. Doing well on this. Normal pap history. Anxiety, ADHD managed by PCP. Gardasil series completed. Fatty liver managed by GI.   Gynecologic History No LMP recorded.   Contraception/Family planning: OCP (estrogen/progesterone) Sexually active: not currently   Health Maintenance Last Pap: 07/05/2021. Results were: Normal Last mammogram: Not indicated Last colonoscopy: Not indicated Last Dexa: Not indicated   Past medical history, past surgical history, family history and social history were all reviewed and documented in the EPIC chart. 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   ROS:  A ROS was performed and pertinent positives and negatives are included.  Exam:  There were no vitals filed for this visit.   There is no height or weight on file to calculate BMI.  General appearance:  Normal Thyroid:  Symmetrical, normal in size, without palpable masses or nodularity. Respiratory  Auscultation:  Clear without wheezing or rhonchi Cardiovascular  Auscultation:  Regular rate, without rubs, murmurs or gallops  Edema/varicosities:  Not grossly evident Abdominal  Soft,nontender, without masses, guarding or rebound.  Liver/spleen:  No organomegaly noted  Hernia:  None appreciated  Skin  Inspection:  Grossly normal Breasts: Not indicated per guidelines Genitourinary Not indicated   Assessment/Plan:  22 y.o. G0 for annual exam.   Well female exam with routine gynecological exam - Education provided on SBEs, importance of preventative screenings, current guidelines, high calcium diet, regular exercise, and multivitamin daily. Labs with PCP.   Encounter for surveillance of contraceptive pills - Dermatology changed to Yasmin for acne. Good management. Taking as prescribed. Does  not need refills at this time.   Screening for cervical cancer - Normal pap history. Will repeat at 3-year interval per guidelines.   Return in 1 year for annual.    Olivia Mackie DNP, 7:30 AM 07/12/2023

## 2023-07-14 ENCOUNTER — Other Ambulatory Visit (HOSPITAL_COMMUNITY): Payer: Self-pay

## 2023-07-14 DIAGNOSIS — G4720 Circadian rhythm sleep disorder, unspecified type: Secondary | ICD-10-CM | POA: Diagnosis not present

## 2023-07-14 MED ORDER — TRAZODONE HCL 50 MG PO TABS
50.0000 mg | ORAL_TABLET | Freq: Every day | ORAL | 0 refills | Status: DC | PRN
  Filled 2023-07-14: qty 30, 30d supply, fill #0

## 2023-08-07 ENCOUNTER — Other Ambulatory Visit (HOSPITAL_COMMUNITY): Payer: Self-pay

## 2023-08-07 DIAGNOSIS — F5101 Primary insomnia: Secondary | ICD-10-CM | POA: Diagnosis not present

## 2023-08-07 MED ORDER — TRAZODONE HCL 50 MG PO TABS
50.0000 mg | ORAL_TABLET | Freq: Every evening | ORAL | 1 refills | Status: DC | PRN
  Filled 2023-08-07 – 2023-08-17 (×2): qty 90, 90d supply, fill #0
  Filled 2023-11-21: qty 90, 90d supply, fill #1

## 2023-08-17 ENCOUNTER — Other Ambulatory Visit (HOSPITAL_COMMUNITY): Payer: Self-pay

## 2023-08-17 ENCOUNTER — Ambulatory Visit: Payer: Commercial Managed Care - PPO | Admitting: Internal Medicine

## 2023-08-27 ENCOUNTER — Other Ambulatory Visit (HOSPITAL_COMMUNITY): Payer: Self-pay

## 2023-08-28 ENCOUNTER — Other Ambulatory Visit: Payer: Self-pay

## 2023-08-29 ENCOUNTER — Other Ambulatory Visit (HOSPITAL_COMMUNITY): Payer: Self-pay

## 2023-08-29 MED ORDER — SERTRALINE HCL 100 MG PO TABS
100.0000 mg | ORAL_TABLET | Freq: Every day | ORAL | 1 refills | Status: DC
  Filled 2023-08-29: qty 90, 90d supply, fill #0
  Filled 2023-12-07: qty 90, 90d supply, fill #1

## 2023-09-11 ENCOUNTER — Encounter: Payer: Self-pay | Admitting: Nurse Practitioner

## 2023-09-13 NOTE — Telephone Encounter (Signed)
Spoke with patient. Reports right sided flank pain started on 09/11/23. Denies fever/chills, N/V, dysuria, hematuria.   OV scheduled for 9/19 at 0845 with TW.  Patient declines OV for today.  ER/Urgent care precautions provided for new or worsening symptoms.  Routing to provider for final review. Patient is agreeable to disposition. Will close encounter.

## 2023-09-14 ENCOUNTER — Other Ambulatory Visit (HOSPITAL_COMMUNITY): Payer: Self-pay

## 2023-09-14 ENCOUNTER — Ambulatory Visit: Payer: Commercial Managed Care - PPO | Admitting: Nurse Practitioner

## 2023-09-14 ENCOUNTER — Encounter: Payer: Self-pay | Admitting: Nurse Practitioner

## 2023-09-14 VITALS — BP 116/74 | HR 77 | Temp 98.7°F | Wt 187.0 lb

## 2023-09-14 DIAGNOSIS — R112 Nausea with vomiting, unspecified: Secondary | ICD-10-CM

## 2023-09-14 DIAGNOSIS — K760 Fatty (change of) liver, not elsewhere classified: Secondary | ICD-10-CM | POA: Insufficient documentation

## 2023-09-14 DIAGNOSIS — N760 Acute vaginitis: Secondary | ICD-10-CM | POA: Diagnosis not present

## 2023-09-14 DIAGNOSIS — R109 Unspecified abdominal pain: Secondary | ICD-10-CM | POA: Diagnosis not present

## 2023-09-14 DIAGNOSIS — B9689 Other specified bacterial agents as the cause of diseases classified elsewhere: Secondary | ICD-10-CM | POA: Diagnosis not present

## 2023-09-14 DIAGNOSIS — Z113 Encounter for screening for infections with a predominantly sexual mode of transmission: Secondary | ICD-10-CM | POA: Diagnosis not present

## 2023-09-14 LAB — WET PREP FOR TRICH, YEAST, CLUE

## 2023-09-14 MED ORDER — METRONIDAZOLE 500 MG PO TABS
500.0000 mg | ORAL_TABLET | Freq: Two times a day (BID) | ORAL | 0 refills | Status: DC
Start: 2023-09-14 — End: 2023-09-21
  Filled 2023-09-14: qty 14, 7d supply, fill #0

## 2023-09-14 NOTE — Progress Notes (Signed)
Acute Office Visit  Subjective:    Patient ID: Leslie [name], female    DOB: [birth year], 22 y.o.   MRN: [id number]   HPI 22 y.o. G0 presents today for right lower back pain that radiates to abdomen. Was nauseous and dry heaving last night. No GI symptoms today. Symptoms started 3 days ago. Last sexual encounter 2 months ago. Denies dysuria, frequency, urgency, fever or hematuria. Denies vaginal itching, discharge or odor. No changes in bowels. Diagnosed with fatty liver this year, recent liver biopsy showed chemical changes/reactive changes. Has not drinking alcohol. H/O gastric ulcers, taking PPI.   Patient's last menstrual period was 08/24/2023.    Review of Systems  Constitutional: Negative.   Gastrointestinal:  Positive for abdominal pain, nausea and vomiting.  Genitourinary:  Negative for difficulty urinating, dysuria, flank pain, frequency, hematuria, urgency, vaginal bleeding, vaginal discharge and vaginal pain.  Musculoskeletal:  Positive for back pain.       Objective:    Physical Exam Constitutional:      Appearance: Normal appearance.  Genitourinary:    General: Normal vulva.     Vagina: Vaginal discharge present. No erythema.     Cervix: Normal.     BP 116/74   Pulse 77   Temp 98.7 F (37.1 C)   Wt 187 lb (84.8 kg)   LMP 08/24/2023   SpO2 100%   BMI 36.52 kg/m  Wt Readings from Last 3 Encounters:  09/14/23 187 lb (84.8 kg)  05/10/23 191 lb (86.6 kg)  05/02/23 189 lb (85.7 kg)        Patient informed chaperone available to be present for breast and/or pelvic exam. Patient has requested no chaperone to be present. Patient has been advised what will be completed during breast and pelvic exam.   UA negative (clue cells present)  Wet prep + clue cells (+ odor)  Assessment & Plan:   Problem List Items Addressed This Visit   None Visit Diagnoses     Bacterial vaginosis    -  Primary   Relevant Medications   metroNIDAZOLE (FLAGYL) 500 MG  tablet   Other Relevant Orders   WET PREP FOR TRICH, YEAST, CLUE   Flank pain       Relevant Orders   Urinalysis,Complete w/RFL Culture   Nausea and vomiting, unspecified vomiting type       Screening examination for STD (sexually transmitted disease)       Relevant Orders   C. trachomatis/N. gonorrhoeae RNA      Plan: UA negative. Wet prep positive for clue cells - Flagyl 500 BID x 7 days. GC/CT pending.      Olivia Mackie DNP, 9:01 AM 09/14/2023

## 2023-09-15 LAB — C. TRACHOMATIS/N. GONORRHOEAE RNA
C. trachomatis RNA, TMA: NOT DETECTED
N. gonorrhoeae RNA, TMA: NOT DETECTED

## 2023-09-16 LAB — URINALYSIS, COMPLETE W/RFL CULTURE
Bilirubin Urine: NEGATIVE
Glucose, UA: NEGATIVE
Hgb urine dipstick: NEGATIVE
Hyaline Cast: NONE SEEN /LPF
Ketones, ur: NEGATIVE
Leukocyte Esterase: NEGATIVE
Nitrites, Initial: NEGATIVE
Protein, ur: NEGATIVE
RBC / HPF: NONE SEEN /HPF (ref 0–2)
Specific Gravity, Urine: 1.02 (ref 1.001–1.035)
WBC, UA: NONE SEEN /HPF (ref 0–5)
pH: 6.5 (ref 5.0–8.0)

## 2023-09-16 LAB — URINE CULTURE
MICRO NUMBER:: 15489053
Result:: NO GROWTH
SPECIMEN QUALITY:: ADEQUATE

## 2023-09-16 LAB — CULTURE INDICATED

## 2023-09-20 ENCOUNTER — Encounter: Payer: Self-pay | Admitting: Nurse Practitioner

## 2023-09-20 NOTE — Telephone Encounter (Signed)
Spoke with patient. Patient reports right flank pain and pain in RLQ/pelvis since 9/19. Pain currently 5 out of 10, reduced from 9 after taking OTC Tylenol 1000 mg at 1400. Nausea for 2 days. Denies vomiting, fever or chills. LMP 08/16/23. Normal BM, last BM 09/20/23.  Completes last dose of Flagyl on 9/26. Denies urinary symptoms. Instructed patient to take UPT to r/o pregnancy, return call to advise if positive.   OV scheduled for 9/26 at 10 am with Dr. Karma Greaser.  ER precautions provided for new or worsening symptoms.   Patient verbalizes understanding.

## 2023-09-21 ENCOUNTER — Encounter: Payer: Self-pay | Admitting: Obstetrics and Gynecology

## 2023-09-21 ENCOUNTER — Other Ambulatory Visit: Payer: Self-pay

## 2023-09-21 ENCOUNTER — Other Ambulatory Visit (HOSPITAL_COMMUNITY): Payer: Self-pay

## 2023-09-21 ENCOUNTER — Other Ambulatory Visit: Payer: Self-pay | Admitting: Internal Medicine

## 2023-09-21 ENCOUNTER — Ambulatory Visit: Payer: Commercial Managed Care - PPO | Admitting: Obstetrics and Gynecology

## 2023-09-21 VITALS — BP 110/70 | HR 92 | Temp 98.7°F | Wt 186.0 lb

## 2023-09-21 DIAGNOSIS — N760 Acute vaginitis: Secondary | ICD-10-CM | POA: Diagnosis not present

## 2023-09-21 DIAGNOSIS — N3001 Acute cystitis with hematuria: Secondary | ICD-10-CM | POA: Diagnosis not present

## 2023-09-21 DIAGNOSIS — R102 Pelvic and perineal pain: Secondary | ICD-10-CM

## 2023-09-21 DIAGNOSIS — N898 Other specified noninflammatory disorders of vagina: Secondary | ICD-10-CM

## 2023-09-21 LAB — URINALYSIS, COMPLETE W/RFL CULTURE
Glucose, UA: NEGATIVE
Hyaline Cast: NONE SEEN /LPF
Nitrites, Initial: NEGATIVE
Specific Gravity, Urine: 1.02 (ref 1.001–1.035)
pH: 6.5 (ref 5.0–8.0)

## 2023-09-21 LAB — CULTURE INDICATED

## 2023-09-21 MED ORDER — METRONIDAZOLE 0.75 % VA GEL
1.0000 | Freq: Two times a day (BID) | VAGINAL | 0 refills | Status: DC
Start: 2023-09-21 — End: 2024-01-31
  Filled 2023-09-21: qty 70, 5d supply, fill #0

## 2023-09-21 MED ORDER — CEFTRIAXONE SODIUM 1 G IJ SOLR
1.0000 g | Freq: Once | INTRAMUSCULAR | Status: AC
Start: 2023-09-21 — End: 2023-09-21
  Administered 2023-09-21: 1 g via INTRAMUSCULAR

## 2023-09-21 MED ORDER — FLUCONAZOLE 150 MG PO TABS
150.0000 mg | ORAL_TABLET | Freq: Once | ORAL | 2 refills | Status: DC | PRN
Start: 2023-09-21 — End: 2024-03-28
  Filled 2023-09-21: qty 1, 1d supply, fill #0

## 2023-09-21 MED ORDER — OMEPRAZOLE 40 MG PO CPDR
40.0000 mg | DELAYED_RELEASE_CAPSULE | Freq: Two times a day (BID) | ORAL | 2 refills | Status: DC
  Filled 2023-09-21: qty 60, 30d supply, fill #0
  Filled 2024-01-12: qty 60, 30d supply, fill #1
  Filled 2024-03-01: qty 60, 30d supply, fill #2

## 2023-09-21 NOTE — Progress Notes (Signed)
Patient presents for right lower lumbar pain that radiates to her RLQ No SOB, Chills last night. Had to leave class yesterday because of the pain. +dysuria.  Blood pressure 110/70, pulse 92, temperature 98.7 F (37.1 C), temperature source Oral, weight 186 lb (84.4 kg), last menstrual period 09/20/2023, SpO2 98%.  Ua with 3+blood, trace leuks, trace ketones, clue cells 0-5 wbc  Exam: no cva tenderness No guarding no rebound  A/p UTI, rule out kidney stones Rocephin 1 gram IM given today UC sent Diflucan sent for yeast prophylaxis Return with persistent or worsening symptoms 5. To get renal US and TV US to evaluate for gyn causes 6.  Metrogel sent for BV  Dr. Karma Greaser

## 2023-09-21 NOTE — Patient Instructions (Signed)
The vaginal ultrasound order has been placed, all you need to do is call the scheduling desk at [phone] to make the appointment at one of our outpatient imaging centers at your earliest convenience. This is to evaluate your uterus and ovaries.  The report will go directly to epic after.  PLEASE also call the office with the date you have the ultrasound scheduled, so we can schedule a follow-up to discuss results with you.  Dr. Karma Greaser

## 2023-09-23 LAB — URINE CULTURE
MICRO NUMBER:: 15519996
SPECIMEN QUALITY:: ADEQUATE

## 2023-09-25 ENCOUNTER — Ambulatory Visit (HOSPITAL_COMMUNITY)
Admission: RE | Admit: 2023-09-25 | Discharge: 2023-09-25 | Disposition: A | Discharge status: Home / Self Care | Payer: Commercial Managed Care - PPO | Source: Ambulatory Visit | Attending: Obstetrics and Gynecology | Admitting: Obstetrics and Gynecology

## 2023-09-25 DIAGNOSIS — R102 Pelvic and perineal pain unspecified side: Secondary | ICD-10-CM

## 2023-09-25 DIAGNOSIS — N3001 Acute cystitis with hematuria: Secondary | ICD-10-CM

## 2023-09-25 DIAGNOSIS — R319 Hematuria, unspecified: Secondary | ICD-10-CM | POA: Diagnosis not present

## 2023-09-25 DIAGNOSIS — R1031 Right lower quadrant pain: Secondary | ICD-10-CM | POA: Diagnosis not present

## 2023-09-27 ENCOUNTER — Encounter: Payer: Self-pay | Admitting: Obstetrics and Gynecology

## 2023-09-29 ENCOUNTER — Other Ambulatory Visit: Payer: Self-pay

## 2023-09-29 ENCOUNTER — Encounter (HOSPITAL_BASED_OUTPATIENT_CLINIC_OR_DEPARTMENT_OTHER): Payer: Self-pay | Admitting: Emergency Medicine

## 2023-09-29 ENCOUNTER — Emergency Department (HOSPITAL_BASED_OUTPATIENT_CLINIC_OR_DEPARTMENT_OTHER)
Admission: EM | Admit: 2023-09-29 | Discharge: 2023-09-30 | Disposition: A | Discharge status: Home / Self Care | Payer: Self-pay | Attending: Emergency Medicine | Admitting: Emergency Medicine

## 2023-09-29 DIAGNOSIS — W228XXA Striking against or struck by other objects, initial encounter: Secondary | ICD-10-CM | POA: Diagnosis not present

## 2023-09-29 DIAGNOSIS — S61431A Puncture wound without foreign body of right hand, initial encounter: Secondary | ICD-10-CM | POA: Insufficient documentation

## 2023-09-29 DIAGNOSIS — Y990 Civilian activity done for income or pay: Secondary | ICD-10-CM | POA: Diagnosis not present

## 2023-09-29 DIAGNOSIS — S6991XA Unspecified injury of right wrist, hand and finger(s), initial encounter: Secondary | ICD-10-CM | POA: Diagnosis present

## 2023-09-29 DIAGNOSIS — S61239A Puncture wound without foreign body of unspecified finger without damage to nail, initial encounter: Secondary | ICD-10-CM

## 2023-09-29 MED ORDER — OXYCODONE-ACETAMINOPHEN 5-325 MG PO TABS
1.0000 | ORAL_TABLET | ORAL | Status: DC | PRN
  Administered 2023-09-29: 1 via ORAL
  Filled 2023-09-29: qty 1

## 2023-09-29 NOTE — ED Triage Notes (Signed)
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  Had to extricate her hand off of the nail.  Bleeding controlled.  Believes tetanus is up to date.  Reports decreased sensation in her fingers on that hand.

## 2023-09-30 ENCOUNTER — Emergency Department (HOSPITAL_BASED_OUTPATIENT_CLINIC_OR_DEPARTMENT_OTHER): Payer: Commercial Managed Care - PPO

## 2023-09-30 ENCOUNTER — Other Ambulatory Visit: Payer: Self-pay

## 2023-09-30 ENCOUNTER — Other Ambulatory Visit (HOSPITAL_BASED_OUTPATIENT_CLINIC_OR_DEPARTMENT_OTHER): Payer: Self-pay

## 2023-09-30 DIAGNOSIS — M79641 Pain in right hand: Secondary | ICD-10-CM | POA: Diagnosis not present

## 2023-09-30 MED ORDER — CEPHALEXIN 500 MG PO CAPS
500.0000 mg | ORAL_CAPSULE | Freq: Three times a day (TID) | ORAL | 0 refills | Status: DC
Start: 2023-09-30 — End: 2023-09-30
  Filled 2023-09-30: qty 15, 5d supply, fill #0

## 2023-09-30 MED ORDER — CEPHALEXIN 250 MG PO CAPS
500.0000 mg | ORAL_CAPSULE | Freq: Once | ORAL | Status: AC
  Administered 2023-09-30: 500 mg via ORAL
  Filled 2023-09-30: qty 2

## 2023-09-30 MED ORDER — CEPHALEXIN 500 MG PO CAPS
500.0000 mg | ORAL_CAPSULE | Freq: Three times a day (TID) | ORAL | 0 refills | Status: AC
  Filled 2023-09-30: qty 15, 5d supply, fill #0

## 2023-09-30 MED ORDER — KETOROLAC TROMETHAMINE 60 MG/2ML IM SOLN
30.0000 mg | Freq: Once | INTRAMUSCULAR | Status: AC
  Administered 2023-09-30: 30 mg via INTRAMUSCULAR
  Filled 2023-09-30: qty 2

## 2023-09-30 MED ORDER — NAPROXEN 375 MG PO TABS
375.0000 mg | ORAL_TABLET | Freq: Two times a day (BID) | ORAL | 0 refills | Status: DC | PRN
  Filled 2023-09-30: qty 20, 10d supply, fill #0

## 2023-09-30 MED ORDER — CEPHALEXIN 500 MG PO CAPS
500.0000 mg | ORAL_CAPSULE | Freq: Three times a day (TID) | ORAL | 0 refills | Status: DC
  Filled 2023-09-30: qty 42, 14d supply, fill #0

## 2023-09-30 NOTE — ED Provider Notes (Signed)
[place] EMERGENCY DEPARTMENT AT [facility] Provider Note  CSN: [id number] Arrival date & time: 09/29/23 2319  Chief Complaint(s) Hand Injury  HPI Leslie [name] is a 22 y.o. female who presented to the emergency department for right hand pain.  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  There were 2 puncture wounds.  Patient is endorsing severe throbbing pain radiating to her forearm with decreased sensation of the ulnar aspect of her hand and fingers.  Pain is worse with palpation of the affected area and range of motion of the associated fingers. Patient reports tetanus is up-to-date within the last 5 years.  The history is provided by the patient.    Past Medical History Past Medical History:  Diagnosis Date   ADHD (attention deficit hyperactivity disorder)    Allergy    Anxiety    Hepatic steatosis    Patient Active Problem List   Diagnosis Date Noted   Hepatic steatosis 09/14/2023   Vitamin B12 deficiency 05/10/2018   B12 deficiency 02/08/2018   Home Medication(s) Prior to Admission medications   Medication Sig Start Date End Date Taking? Authorizing Provider  cephALEXin (KEFLEX) 500 MG capsule Take 1 capsule (500 mg total) by mouth 3 (three) times daily for 5 days. 09/30/23 10/05/23 Yes Tasean Mancha, Amadeo Garnet, MD  naproxen (NAPROSYN) 375 MG tablet Take 1 tablet (375 mg total) by mouth 2 (two) times daily as needed. 09/30/23  Yes Bocephus Cali, Amadeo Garnet, MD  cetirizine (ZYRTEC) 10 MG tablet Take 10 mg by mouth daily.    [provider]  Cholecalciferol (VITAMIN D3) 2000 units TABS Take 2,000 Units by mouth daily.    [provider]  Clindamycin-Benzoyl Per, Refr, gel Apply a small amount to skin topically in the morning. 06/01/22     Cranberry 1000 MG CAPS     [provider]  drospirenone-ethinyl estradiol (YASMIN)  3-0.03 MG tablet Take 1 tablet by mouth once a day 01/10/23     fluconazole (DIFLUCAN) 150 MG tablet Take 1 tablet (150 mg total) by mouth once, for 1 dose. 09/21/23   Earley Favor, MD  lisdexamfetamine (VYVANSE) 30 MG capsule Take 1 capsule (30 mg total) by mouth in the morning. 08/15/22     metroNIDAZOLE (METROGEL) 0.75 % vaginal gel Place 1 Applicatorful vaginally 2 (two) times daily. 09/21/23   Earley Favor, MD  omeprazole (PRILOSEC) 40 MG capsule Take 1 capsule (40 mg total) by mouth 2 (two) times daily. 09/21/23   Imogene Burn, MD  sertraline (ZOLOFT) 100 MG tablet Take 1 tablet (100 mg total) by mouth daily. 08/29/23     sucralfate (CARAFATE) 1 g tablet Take 1 tablet (1 g total) by mouth 3 (three) times daily as needed. 03/28/23   Imogene Burn, MD  traZODone (DESYREL) 50 MG tablet Take 1 tablet (50 mg total) by mouth at bedtime as needed. 08/07/23     tretinoin (RETIN-A) 0.05 % cream Apply 1 application (a small amount) topically to skin every night Patient not taking: Reported on 09/21/2023 03/01/22     tretinoin (RETIN-A) 0.05 % cream Apply 1 application (a small amount) topically every evening. Patient not taking: Reported on 09/21/2023 06/01/22     vitamin B-12 (CYANOCOBALAMIN) 1000 MCG tablet Take 1,000 mcg by mouth daily.    [provider]  Allergies Amoxicillin-pot clavulanate  Review of Systems Review of Systems As noted in HPI  Physical Exam Vital Signs  I have reviewed the triage vital signs BP 123/84 (BP Location: Right Arm)   Pulse 100   Temp 98.3 F (36.8 C) (Oral)   Resp 18   LMP 09/20/2023 (Exact Date)   SpO2 99%   Physical Exam Vitals reviewed.  Constitutional:      General: She is not in acute distress.    Appearance: She is well-developed. She is not diaphoretic.  HENT:     Head: Normocephalic and atraumatic.      Right Ear: External ear normal.     Left Ear: External ear normal.     Nose: Nose normal.  Eyes:     General: No scleral icterus.    Conjunctiva/sclera: Conjunctivae normal.  Neck:     Trachea: Phonation normal.  Cardiovascular:     Rate and Rhythm: Normal rate and regular rhythm.  Pulmonary:     Effort: Pulmonary effort is normal. No respiratory distress.     Breath sounds: No stridor.  Abdominal:     General: There is no distension.  Musculoskeletal:        General: Normal range of motion.     Right hand: Swelling and tenderness present. Normal strength. Normal sensation. Normal pulse.       Hands:     Cervical back: Normal range of motion.  Neurological:     Mental Status: She is alert and oriented to person, place, and time.  Psychiatric:        Behavior: Behavior normal.     ED Results and Treatments Labs (all labs ordered are listed, but only abnormal results are displayed) Labs Reviewed - No data to display                                                                                                                       EKG  EKG Interpretation Date/Time:    Ventricular Rate:    PR Interval:    QRS Duration:    QT Interval:    QTC Calculation:   R Axis:      Text Interpretation:         Radiology DG Hand Complete Right  Result Date: 09/30/2023 CLINICAL DATA:  Right hand injury, pain EXAM: RIGHT HAND - COMPLETE 3+ VIEW COMPARISON:  None Available. FINDINGS: There is no evidence of fracture or dislocation. There is no evidence of arthropathy or other focal bone abnormality. Soft tissues are unremarkable. IMPRESSION: Negative. Electronically Signed   By: Helyn Numbers M.D.   On: 09/30/2023 01:45    Medications Ordered in ED Medications  oxyCODONE-acetaminophen (PERCOCET/ROXICET) 5-325 MG per tablet 1 tablet (1 tablet Oral Given 09/29/23 2348)  ketorolac (TORADOL) injection 30 mg (30 mg Intramuscular Given 09/30/23 0054)  cephALEXin (KEFLEX) capsule 500 mg  (500 mg Oral Given 09/30/23 0239)   Procedures Procedures  (including critical care time) Medical Decision Making / ED Course  Medical Decision Making Amount and/or Complexity of Data Reviewed Radiology: ordered and independent interpretation performed. Decision-making details documented in ED Course.  Risk Prescription drug management.    2 puncture wound to the right ulnar aspect of the palm.  Small wounds currently hemostatic.  Notable underlying hematoma. X-rays negative for bony irregularities. Patient provided with oral and IM pain medicine. Neurovascular intact distally Provided with brace Prophylactic antibiotics Rx'd Ortho/Hand f/u as needed.    Final Clinical Impression(s) / ED Diagnoses Final diagnoses:  Puncture wound of finger of right hand, initial encounter   The patient appears reasonably screened and/or stabilized for discharge and I doubt any other medical condition or other [facility] requiring further screening, evaluation, or treatment in the ED at this time. I have discussed the findings, Dx and Tx plan with the patient/family who expressed understanding and agree(s) with the plan. Discharge instructions discussed at length. The patient/family was given strict return precautions who verbalized understanding of the instructions. No further questions at time of discharge.  Disposition: Discharge  Condition: Good  ED Discharge Orders          Ordered    cephALEXin (KEFLEX) 500 MG capsule  3 times daily        09/30/23 0235    naproxen (NAPROSYN) 375 MG tablet  2 times daily PRN        09/30/23 0235            Follow Up: Fatima Sanger, FNP [address] [place] [address] [phone]  Call  as needed  Knute Neu, MD [address]  [address] [place] [address] [phone]  Call  as needed    This chart was dictated using voice recognition software.  Despite best efforts to proofread,  errors can occur which  can change the documentation meaning.    Nira Conn, MD 09/30/23 [phone]

## 2023-09-30 NOTE — ED Notes (Signed)
  Mother of patient refused wrist splint.

## 2023-09-30 NOTE — Discharge Instructions (Addendum)
For pain control you may take 1000 mg of acetaminophen (Tylenol) every 8 hours and/or 375 mg of Naproxen every 12 hours as needed.  Please limit acetaminophen (Tylenol) to 4000 mg for a 24hr period. Please note that other over-the-counter medicine may contain acetaminophen as a component of their ingredients. Do not take other NSAIDs such as Ibuprofen while taking Naproxen.

## 2023-09-30 NOTE — ED Notes (Signed)
Pt opted not to receive wrist splint after all .

## 2023-10-02 ENCOUNTER — Telehealth: Payer: Self-pay | Admitting: Orthopedic Surgery

## 2023-10-02 DIAGNOSIS — S61431A Puncture wound without foreign body of right hand, initial encounter: Secondary | ICD-10-CM | POA: Diagnosis not present

## 2023-10-02 NOTE — Telephone Encounter (Signed)
Patient called and said that her hand was nailed to the wall at work and wants to know if you could fit her in sooner than next week. Two of her fingers can't move and she already been to [facility] to be looked at and he told them to follow up here. CB#[phone]

## 2023-10-02 NOTE — Telephone Encounter (Signed)
Yes - ash thx

## 2023-10-06 ENCOUNTER — Encounter (HOSPITAL_COMMUNITY): Payer: Self-pay | Admitting: Emergency Medicine

## 2023-11-01 ENCOUNTER — Other Ambulatory Visit (HOSPITAL_COMMUNITY): Payer: Self-pay

## 2023-11-01 DIAGNOSIS — J45909 Unspecified asthma, uncomplicated: Secondary | ICD-10-CM | POA: Diagnosis not present

## 2023-11-01 DIAGNOSIS — R062 Wheezing: Secondary | ICD-10-CM | POA: Diagnosis not present

## 2023-11-01 MED ORDER — ALBUTEROL SULFATE HFA 108 (90 BASE) MCG/ACT IN AERS
1.0000 | INHALATION_SPRAY | RESPIRATORY_TRACT | 1 refills | Status: AC
  Filled 2023-11-01: qty 6.7, 25d supply, fill #0
  Filled 2024-01-07: qty 6.7, 25d supply, fill #1

## 2023-11-01 MED ORDER — AZITHROMYCIN 250 MG PO TABS
ORAL_TABLET | ORAL | 0 refills | Status: AC
  Filled 2023-11-01: qty 6, 5d supply, fill #0

## 2023-11-01 MED ORDER — AEROCHAMBER PLUS FLO-VU MISC
0 refills | Status: AC
Start: 2023-11-01 — End: ?
  Filled 2023-11-01: qty 1, 30d supply, fill #0

## 2023-11-03 DIAGNOSIS — R062 Wheezing: Secondary | ICD-10-CM | POA: Diagnosis not present

## 2023-11-03 DIAGNOSIS — J45909 Unspecified asthma, uncomplicated: Secondary | ICD-10-CM | POA: Diagnosis not present

## 2023-11-03 DIAGNOSIS — J189 Pneumonia, unspecified organism: Secondary | ICD-10-CM | POA: Diagnosis not present

## 2023-12-22 ENCOUNTER — Other Ambulatory Visit (HOSPITAL_COMMUNITY): Payer: Self-pay

## 2023-12-22 DIAGNOSIS — J309 Allergic rhinitis, unspecified: Secondary | ICD-10-CM | POA: Diagnosis not present

## 2023-12-22 MED ORDER — PREDNISONE 20 MG PO TABS
ORAL_TABLET | ORAL | 0 refills | Status: AC
  Filled 2023-12-22: qty 18, 9d supply, fill #0

## 2023-12-22 MED ORDER — HYDROXYZINE HCL 25 MG PO TABS
25.0000 mg | ORAL_TABLET | Freq: Three times a day (TID) | ORAL | 0 refills | Status: DC | PRN
  Filled 2023-12-22: qty 21, 7d supply, fill #0

## 2023-12-26 ENCOUNTER — Other Ambulatory Visit (HOSPITAL_COMMUNITY): Payer: Self-pay

## 2023-12-26 ENCOUNTER — Telehealth: Payer: Commercial Managed Care - PPO | Admitting: Physician Assistant

## 2023-12-26 DIAGNOSIS — R110 Nausea: Secondary | ICD-10-CM

## 2023-12-26 DIAGNOSIS — B9689 Other specified bacterial agents as the cause of diseases classified elsewhere: Secondary | ICD-10-CM

## 2023-12-26 DIAGNOSIS — J208 Acute bronchitis due to other specified organisms: Secondary | ICD-10-CM

## 2023-12-26 MED ORDER — PROMETHAZINE-DM 6.25-15 MG/5ML PO SYRP
5.0000 mL | ORAL_SOLUTION | Freq: Four times a day (QID) | ORAL | 0 refills | Status: DC | PRN
  Filled 2023-12-26: qty 118, 6d supply, fill #0

## 2023-12-26 MED ORDER — AZITHROMYCIN 250 MG PO TABS
ORAL_TABLET | ORAL | 0 refills | Status: AC
Start: 2023-12-26 — End: 2023-12-31
  Filled 2023-12-26: qty 6, 5d supply, fill #0

## 2023-12-26 MED ORDER — ONDANSETRON 4 MG PO TBDP
4.0000 mg | ORAL_TABLET | Freq: Three times a day (TID) | ORAL | 0 refills | Status: DC | PRN
Start: 2023-12-26 — End: 2024-03-01
  Filled 2023-12-26: qty 20, 7d supply, fill #0

## 2023-12-26 NOTE — Progress Notes (Signed)
 Virtual Visit Consent   Leslie [name], you are scheduled for a virtual visit with a [facility] provider today. Just as with appointments in the office, your consent must be obtained to participate. Your consent will be active for this visit and any virtual visit you may have with one of our providers in the next 365 days. If you have a MyChart account, a copy of this consent can be sent to you electronically.  As this is a virtual visit, video technology does not allow for your provider to perform a traditional examination. This may limit your provider's ability to fully assess your condition. If your provider identifies any concerns that need to be evaluated in person or the need to arrange testing (such as labs, EKG, etc.), we will make arrangements to do so. Although advances in technology are sophisticated, we cannot ensure that it will always work on either your end or our end. If the connection with a video visit is poor, the visit may have to be switched to a telephone visit. With either a video or telephone visit, we are not always able to ensure that we have a secure connection.  By engaging in this virtual visit, you consent to the provision of healthcare and authorize for your insurance to be billed (if applicable) for the services provided during this visit. Depending on your insurance coverage, you may receive a charge related to this service.  I need to obtain your verbal consent now. Are you willing to proceed with your visit today? Leslie [name] has provided verbal consent on 12/26/2023 for a virtual visit (video or telephone). Leslie CHRISTELLA Dickinson, PA-C  Date: 12/26/2023 11:04 AM  Virtual Visit via Video Note   I, Leslie [name], connected with  Leslie [name]  ([id number], 01/10/2001) on 12/26/23 at 11:00 AM EST by a video-enabled telemedicine application and verified that I am speaking with the correct person using two identifiers.  Location: Patient: Virtual  Visit Location Patient: Home Provider: Virtual Visit Location Provider: Home Office   I discussed the limitations of evaluation and management by telemedicine and the availability of in person appointments. The patient expressed understanding and agreed to proceed.    History of Present Illness: Leslie [name] is a 22 y.o. who identifies as a female who was assigned female at birth, and is being seen today for URI symptoms.  HPI: URI  This is a new problem. The current episode started in the past 7 days. The problem has been gradually worsening. Maximum temperature: subjective fevers. Associated symptoms include congestion, coughing, ear pain, headaches, nausea, a plugged ear sensation, rhinorrhea (and post nasal drainage), sinus pain, a sore throat, swollen glands (left) and vomiting. Pertinent negatives include no chest pain, diarrhea or wheezing. Associated symptoms comments: Scratchy voice, dizzy, chills. Treatments tried: TUMS, zofran , advil . The treatment provided no relief.  Negative at home Covid 19 testing  PMH: Bilat pneumonia about 2 months ago   Problems:  Patient Active Problem List   Diagnosis Date Noted   Hepatic steatosis 09/14/2023   Vitamin B12 deficiency 05/10/2018   B12 deficiency 02/08/2018    Allergies:  Allergies  Allergen Reactions   Amoxicillin -Pot Clavulanate Diarrhea    GI upset   Medications:  Current Outpatient Medications:    albuterol  (VENTOLIN  HFA) 108 (90 Base) MCG/ACT inhaler, Inhale 1-2 puffs into the lungs every 4 (four) hours if needed., Disp: 6.7 g, Rfl: 1   azithromycin  (ZITHROMAX ) 250 MG tablet, Take 2 tablets on  day 1, then 1 tablet daily on days 2 through 5, Disp: 6 tablet, Rfl: 0   cetirizine (ZYRTEC) 10 MG tablet, Take 10 mg by mouth daily., Disp: , Rfl:    Cholecalciferol (VITAMIN D3) 2000 units TABS, Take 2,000 Units by mouth daily., Disp: , Rfl:    Clindamycin -Benzoyl Per, Refr, gel, Apply a small amount to skin topically in the  morning., Disp: 45 g, Rfl: 11   Cranberry 1000 MG CAPS, , Disp: , Rfl:    drospirenone -ethinyl estradiol  (YASMIN ) 3-0.03 MG tablet, Take 1 tablet by mouth once a day, Disp: 84 tablet, Rfl: 5   fluconazole  (DIFLUCAN ) 150 MG tablet, Take 1 tablet (150 mg total) by mouth once, for 1 dose., Disp: 1 tablet, Rfl: 2   hydrOXYzine  (ATARAX ) 25 MG tablet, Take 1 tablet (25 mg total) by mouth 3 (three) times daily as needed for itching for 7 days, Disp: 21 tablet, Rfl: 0   lisdexamfetamine (VYVANSE ) 30 MG capsule, Take 1 capsule (30 mg total) by mouth in the morning., Disp: 90 capsule, Rfl: 0   metroNIDAZOLE  (METROGEL ) 0.75 % vaginal gel, Place 1 Applicatorful vaginally 2 (two) times daily., Disp: 70 g, Rfl: 0   naproxen  (NAPROSYN ) 375 MG tablet, Take 1 tablet (375 mg total) by mouth 2 (two) times daily as needed., Disp: 20 tablet, Rfl: 0   omeprazole  (PRILOSEC) 40 MG capsule, Take 1 capsule (40 mg total) by mouth 2 (two) times daily., Disp: 60 capsule, Rfl: 2   ondansetron  (ZOFRAN -ODT) 4 MG disintegrating tablet, Take 1 tablet (4 mg total) by mouth every 8 (eight) hours as needed., Disp: 20 tablet, Rfl: 0   predniSONE  (DELTASONE ) 20 MG tablet, Take 3 tablets (60 mg total) by mouth daily for 3 days, THEN 2 tablets (40 mg total) daily for 3 days, THEN 1 tablet (20 mg total) daily for 3 days., Disp: 18 tablet, Rfl: 0   promethazine -dextromethorphan (PROMETHAZINE -DM) 6.25-15 MG/5ML syrup, Take 5 mLs by mouth 4 (four) times daily as needed., Disp: 118 mL, Rfl: 0   sertraline  (ZOLOFT ) 100 MG tablet, Take 1 tablet (100 mg total) by mouth daily., Disp: 90 tablet, Rfl: 1   Spacer/Aero-Holding Chambers (AEROCHAMBER PLUS WITH MASK) inhaler, Use as directed, Disp: 1 each, Rfl: 0   sucralfate  (CARAFATE ) 1 g tablet, Take 1 tablet (1 g total) by mouth 3 (three) times daily as needed., Disp: 90 tablet, Rfl: 0   traZODone  (DESYREL ) 50 MG tablet, Take 1 tablet (50 mg total) by mouth at bedtime as needed., Disp: 90 tablet, Rfl:  1   tretinoin  (RETIN-A ) 0.05 % cream, Apply 1 application (a small amount) topically to skin every night (Patient not taking: Reported on 09/21/2023), Disp: 20 g, Rfl: 2   tretinoin  (RETIN-A ) 0.05 % cream, Apply 1 application (a small amount) topically every evening. (Patient not taking: Reported on 09/21/2023), Disp: 20 g, Rfl: 11   vitamin B-12 (CYANOCOBALAMIN ) 1000 MCG tablet, Take 1,000 mcg by mouth daily., Disp: , Rfl:    Observations/Objective: Patient is well-developed, well-nourished in no acute distress.  Resting comfortably at home.  Head is normocephalic, atraumatic.  No labored breathing.  Speech is clear and coherent with logical content.  Patient is alert and oriented at baseline.    Assessment and Plan: 1. Acute bacterial bronchitis (Primary) - azithromycin  (ZITHROMAX ) 250 MG tablet; Take 2 tablets on day 1, then 1 tablet daily on days 2 through 5  Dispense: 6 tablet; Refill: 0 - promethazine -dextromethorphan (PROMETHAZINE -DM) 6.25-15 MG/5ML syrup; Take 5 mLs  by mouth 4 (four) times daily as needed.  Dispense: 118 mL; Refill: 0  2. Nausea - ondansetron  (ZOFRAN -ODT) 4 MG disintegrating tablet; Take 1 tablet (4 mg total) by mouth every 8 (eight) hours as needed.  Dispense: 20 tablet; Refill: 0  - Worsening over a week despite OTC medications - Will treat with Z-pack, Promethazine  DM - Zofran  for nausea - Can continue Mucinex  - Push fluids.  - Rest.  - Steam and humidifier can help - Seek in person evaluation if worsening or symptoms fail to improve    Follow Up Instructions: I discussed the assessment and treatment plan with the patient. The patient was provided an opportunity to ask questions and all were answered. The patient agreed with the plan and demonstrated an understanding of the instructions.  A copy of instructions were sent to the patient via MyChart unless otherwise noted below.    The patient was advised to call back or seek an in-person evaluation if  the symptoms worsen or if the condition fails to improve as anticipated.    Leslie CHRISTELLA Dickinson, PA-C

## 2023-12-26 NOTE — Patient Instructions (Signed)
 Leslie [name], thank you for joining [name], PA-C for today's virtual visit.  While this provider is not your primary care provider (PCP), if your PCP is located in our provider database this encounter information will be shared with them immediately following your visit.   A [facility] MyChart account gives you access to today's visit and all your visits, tests, and labs performed at [facility]  click here if you don't have a [facility] MyChart account or go to [email or web address]  Consent: (Patient) Leslie [name] provided verbal consent for this virtual visit at the beginning of the encounter.  Current Medications:  Current Outpatient Medications:    azithromycin  (ZITHROMAX ) 250 MG tablet, Take 2 tablets on day 1, then 1 tablet daily on days 2 through 5, Disp: 6 tablet, Rfl: 0   ondansetron  (ZOFRAN -ODT) 4 MG disintegrating tablet, Take 1 tablet (4 mg total) by mouth every 8 (eight) hours as needed., Disp: 20 tablet, Rfl: 0   promethazine -dextromethorphan (PROMETHAZINE -DM) 6.25-15 MG/5ML syrup, Take 5 mLs by mouth 4 (four) times daily as needed., Disp: 118 mL, Rfl: 0   albuterol  (VENTOLIN  HFA) 108 (90 Base) MCG/ACT inhaler, Inhale 1-2 puffs into the lungs every 4 (four) hours if needed., Disp: 6.7 g, Rfl: 1   cetirizine (ZYRTEC) 10 MG tablet, Take 10 mg by mouth daily., Disp: , Rfl:    Cholecalciferol (VITAMIN D3) 2000 units TABS, Take 2,000 Units by mouth daily., Disp: , Rfl:    Clindamycin -Benzoyl Per, Refr, gel, Apply a small amount to skin topically in the morning., Disp: 45 g, Rfl: 11   Cranberry 1000 MG CAPS, , Disp: , Rfl:    drospirenone -ethinyl estradiol  (YASMIN ) 3-0.03 MG tablet, Take 1 tablet by mouth once a day, Disp: 84 tablet, Rfl: 5   fluconazole  (DIFLUCAN ) 150 MG tablet, Take 1 tablet (150 mg total) by mouth once, for 1 dose., Disp: 1 tablet, Rfl: 2   hydrOXYzine  (ATARAX ) 25 MG tablet, Take 1 tablet (25 mg total) by mouth 3 (three)  times daily as needed for itching for 7 days, Disp: 21 tablet, Rfl: 0   lisdexamfetamine (VYVANSE ) 30 MG capsule, Take 1 capsule (30 mg total) by mouth in the morning., Disp: 90 capsule, Rfl: 0   metroNIDAZOLE  (METROGEL ) 0.75 % vaginal gel, Place 1 Applicatorful vaginally 2 (two) times daily., Disp: 70 g, Rfl: 0   naproxen  (NAPROSYN ) 375 MG tablet, Take 1 tablet (375 mg total) by mouth 2 (two) times daily as needed., Disp: 20 tablet, Rfl: 0   omeprazole  (PRILOSEC) 40 MG capsule, Take 1 capsule (40 mg total) by mouth 2 (two) times daily., Disp: 60 capsule, Rfl: 2   predniSONE  (DELTASONE ) 20 MG tablet, Take 3 tablets (60 mg total) by mouth daily for 3 days, THEN 2 tablets (40 mg total) daily for 3 days, THEN 1 tablet (20 mg total) daily for 3 days., Disp: 18 tablet, Rfl: 0   sertraline  (ZOLOFT ) 100 MG tablet, Take 1 tablet (100 mg total) by mouth daily., Disp: 90 tablet, Rfl: 1   Spacer/Aero-Holding Chambers (AEROCHAMBER PLUS WITH MASK) inhaler, Use as directed, Disp: 1 each, Rfl: 0   sucralfate  (CARAFATE ) 1 g tablet, Take 1 tablet (1 g total) by mouth 3 (three) times daily as needed., Disp: 90 tablet, Rfl: 0   traZODone  (DESYREL ) 50 MG tablet, Take 1 tablet (50 mg total) by mouth at bedtime as needed., Disp: 90 tablet, Rfl: 1   tretinoin  (RETIN-A ) 0.05 % cream, Apply 1 application (a small  amount) topically to skin every night (Patient not taking: Reported on 09/21/2023), Disp: 20 g, Rfl: 2   tretinoin  (RETIN-A ) 0.05 % cream, Apply 1 application (a small amount) topically every evening. (Patient not taking: Reported on 09/21/2023), Disp: 20 g, Rfl: 11   vitamin B-12 (CYANOCOBALAMIN ) 1000 MCG tablet, Take 1,000 mcg by mouth daily., Disp: , Rfl:    Medications ordered in this encounter:  Meds ordered this encounter  Medications   azithromycin  (ZITHROMAX ) 250 MG tablet    Sig: Take 2 tablets on day 1, then 1 tablet daily on days 2 through 5    Dispense:  6 tablet    Refill:  0    Supervising Provider:    LAMPTEY, PHILIP O [[id number]]   promethazine -dextromethorphan (PROMETHAZINE -DM) 6.25-15 MG/5ML syrup    Sig: Take 5 mLs by mouth 4 (four) times daily as needed.    Dispense:  118 mL    Refill:  0    Supervising Provider:   BLAISE ALEENE KIDD [[id number]]   ondansetron  (ZOFRAN -ODT) 4 MG disintegrating tablet    Sig: Take 1 tablet (4 mg total) by mouth every 8 (eight) hours as needed.    Dispense:  20 tablet    Refill:  0    Supervising Provider:   LAMPTEY, PHILIP O [[id number]]     *If you need refills on other medications prior to your next appointment, please contact your pharmacy*  Follow-Up: Call back or seek an in-person evaluation if the symptoms worsen or if the condition fails to improve as anticipated.  [facility] [phone]  Other Instructions Acute Bronchitis, Adult  Acute bronchitis is sudden inflammation of the main airways (bronchi) that come off the windpipe (trachea) in the lungs. The swelling causes the airways to get smaller and make more mucus than normal. This can make it hard to breathe and can cause coughing or noisy breathing (wheezing). Acute bronchitis may last several weeks. The cough may last longer. Allergies, asthma, and exposure to smoke may make the condition worse. What are the causes? This condition can be caused by germs and by substances that irritate the lungs, including: Cold and flu viruses. The most common cause of this condition is the virus that causes the common cold. Bacteria. This is less common. Breathing in substances that irritate the lungs, including: Smoke from cigarettes and other forms of tobacco. Dust and pollen. Fumes from household cleaning products, gases, or burned fuel. Indoor or outdoor air pollution. What increases the risk? The following factors may make you more likely to develop this condition: A weak body's defense system, also called the immune system. A condition that affects your lungs and breathing, such  as asthma. What are the signs or symptoms? Common symptoms of this condition include: Coughing. This may bring up clear, yellow, or green mucus from your lungs (sputum). Wheezing. Runny or stuffy nose. Having too much mucus in your lungs (chest congestion). Shortness of breath. Aches and pains, including sore throat or chest. How is this diagnosed? This condition is usually diagnosed based on: Your symptoms and medical history. A physical exam. You may also have other tests, including tests to rule out other conditions, such as pneumonia. These tests include: A test of lung function. Test of a mucus sample to look for the presence of bacteria. Tests to check the oxygen  level in your blood. Blood tests. Chest X-ray. How is this treated? Most cases of acute bronchitis clear up over time without treatment. Your health care  provider may recommend: Drinking more fluids to help thin your mucus so it is easier to cough up. Taking inhaled medicine (inhaler) to improve air flow in and out of your lungs. Using a vaporizer or a humidifier. These are machines that add water  to the air to help you breathe better. Taking a medicine that thins mucus and clears congestion (expectorant). Taking a medicine that prevents or stops coughing (cough suppressant). It is not common to take an antibiotic medicine for this condition. Follow these instructions at home:  Take over-the-counter and prescription medicines only as told by your health care provider. Use an inhaler, vaporizer, or humidifier as told by your health care provider. Take two teaspoons (10 mL) of honey at bedtime to lessen coughing at night. Drink enough fluid to keep your urine pale yellow. Do not use any products that contain nicotine or tobacco. These products include cigarettes, chewing tobacco, and vaping devices, such as e-cigarettes. If you need help quitting, ask your health care provider. Get plenty of rest. Return to your normal  activities as told by your health care provider. Ask your health care provider what activities are safe for you. Keep all follow-up visits. This is important. How is this prevented? To lower your risk of getting this condition again: Wash your hands often with soap and water  for at least 20 seconds. If soap and water  are not available, use hand sanitizer. Avoid contact with people who have cold symptoms. Try not to touch your mouth, nose, or eyes with your hands. Avoid breathing in smoke or chemical fumes. Breathing smoke or chemical fumes will make your condition worse. Get the flu shot every year. Contact a health care provider if: Your symptoms do not improve after 2 weeks. You have trouble coughing up the mucus. Your cough keeps you awake at night. You have a fever. Get help right away if you: Cough up blood. Feel pain in your chest. Have severe shortness of breath. Faint or keep feeling like you are going to faint. Have a severe headache. Have a fever or chills that get worse. These symptoms may represent a serious problem that is an emergency. Do not wait to see if the symptoms will go away. Get medical help right away. Call your local emergency services (911 in the [place] Do not drive yourself to the hospital. Summary Acute bronchitis is inflammation of the main airways (bronchi) that come off the windpipe (trachea) in the lungs. The swelling causes the airways to get smaller and make more mucus than normal. Drinking more fluids can help thin your mucus so it is easier to cough up. Take over-the-counter and prescription medicines only as told by your health care provider. Do not use any products that contain nicotine or tobacco. These products include cigarettes, chewing tobacco, and vaping devices, such as e-cigarettes. If you need help quitting, ask your health care provider. Contact a health care provider if your symptoms do not improve after 2 weeks. This information is not  intended to replace advice given to you by your health care provider. Make sure you discuss any questions you have with your health care provider. Document Revised: 03/24/2022 Document Reviewed: 04/14/2021 Elsevier Patient Education  2024 Elsevier Inc.    If you have been instructed to have an in-person evaluation today at a local Urgent Care facility, please use the link below. It will take you to a list of all of our available [facility], including address, phone number and hours of operation.  Please do not delay care.  [facility]  If you or a family member do not have a primary care provider, use the link below to schedule a visit and establish care. When you choose a [facility] primary care physician or advanced practice provider, you gain a long-term partner in health. Find a Primary Care Provider  Learn more about [facility] in-office and virtual care options: [facility] - Get Care Now

## 2024-01-12 ENCOUNTER — Other Ambulatory Visit (HOSPITAL_COMMUNITY): Payer: Self-pay

## 2024-01-12 ENCOUNTER — Other Ambulatory Visit: Payer: Self-pay

## 2024-01-12 MED ORDER — LISDEXAMFETAMINE DIMESYLATE 30 MG PO CAPS
30.0000 mg | ORAL_CAPSULE | Freq: Every morning | ORAL | 0 refills | Status: AC
Start: 2024-01-12 — End: ?
  Filled 2024-01-12: qty 30, 30d supply, fill #0

## 2024-01-12 MED ORDER — SERTRALINE HCL 100 MG PO TABS
100.0000 mg | ORAL_TABLET | Freq: Every day | ORAL | 1 refills | Status: DC
  Filled 2024-01-12 – 2024-03-09 (×2): qty 90, 90d supply, fill #0
  Filled 2024-05-24: qty 90, 90d supply, fill #1

## 2024-01-12 MED ORDER — TRAZODONE HCL 50 MG PO TABS
50.0000 mg | ORAL_TABLET | Freq: Every evening | ORAL | 1 refills | Status: DC | PRN
  Filled 2024-01-12 – 2024-03-09 (×2): qty 90, 90d supply, fill #0

## 2024-01-16 ENCOUNTER — Encounter (HOSPITAL_COMMUNITY): Payer: Self-pay

## 2024-01-16 ENCOUNTER — Other Ambulatory Visit (HOSPITAL_COMMUNITY): Payer: Self-pay

## 2024-01-17 DIAGNOSIS — Z0000 Encounter for general adult medical examination without abnormal findings: Secondary | ICD-10-CM | POA: Diagnosis not present

## 2024-01-17 DIAGNOSIS — E559 Vitamin D deficiency, unspecified: Secondary | ICD-10-CM | POA: Diagnosis not present

## 2024-01-24 ENCOUNTER — Other Ambulatory Visit (HOSPITAL_COMMUNITY): Payer: Self-pay

## 2024-01-24 DIAGNOSIS — L700 Acne vulgaris: Secondary | ICD-10-CM | POA: Diagnosis not present

## 2024-01-24 MED ORDER — TRETINOIN 0.05 % EX CREA
1.0000 | TOPICAL_CREAM | Freq: Every evening | CUTANEOUS | 11 refills | Status: DC
  Filled 2024-01-24: qty 20, 30d supply, fill #0

## 2024-01-24 MED ORDER — CLINDAMYCIN PHOS-BENZOYL PEROX 1.2-5 % EX GEL
1.0000 | Freq: Every morning | CUTANEOUS | 11 refills | Status: DC
  Filled 2024-01-24: qty 45, 30d supply, fill #0

## 2024-01-31 ENCOUNTER — Other Ambulatory Visit (HOSPITAL_COMMUNITY): Payer: Self-pay

## 2024-01-31 ENCOUNTER — Other Ambulatory Visit: Payer: Self-pay

## 2024-01-31 ENCOUNTER — Encounter: Payer: Self-pay | Admitting: Allergy

## 2024-01-31 ENCOUNTER — Ambulatory Visit: Payer: Commercial Managed Care - PPO | Admitting: Allergy

## 2024-01-31 VITALS — BP 102/80 | HR 82 | Temp 97.8°F | Resp 18 | Ht 60.0 in | Wt 195.5 lb

## 2024-01-31 DIAGNOSIS — J310 Chronic rhinitis: Secondary | ICD-10-CM

## 2024-01-31 DIAGNOSIS — H1013 Acute atopic conjunctivitis, bilateral: Secondary | ICD-10-CM | POA: Diagnosis not present

## 2024-01-31 DIAGNOSIS — H109 Unspecified conjunctivitis: Secondary | ICD-10-CM

## 2024-01-31 DIAGNOSIS — T7840XD Allergy, unspecified, subsequent encounter: Secondary | ICD-10-CM

## 2024-01-31 DIAGNOSIS — R040 Epistaxis: Secondary | ICD-10-CM | POA: Diagnosis not present

## 2024-01-31 DIAGNOSIS — L2089 Other atopic dermatitis: Secondary | ICD-10-CM | POA: Diagnosis not present

## 2024-01-31 MED ORDER — EPINEPHRINE 0.3 MG/0.3ML IJ SOAJ
0.3000 mg | INTRAMUSCULAR | 1 refills | Status: AC | PRN
  Filled 2024-01-31: qty 2, 2d supply, fill #0

## 2024-01-31 MED ORDER — RYALTRIS 665-25 MCG/ACT NA SUSP: 2.0000 | Freq: Two times a day (BID) | NASAL | 5 refills | Status: DC | PRN

## 2024-01-31 NOTE — Progress Notes (Signed)
 New Patient Note  RE: Leslie [name] MRN: [id number] DOB: 28-Oct-2001 Date of Office Visit: 01/31/2024  Primary care provider: Royden Ronal Czar, FNP  Chief Complaint: allergic reaction  History of present illness: Leslie [name] is a 23 y.o. female presenting today for evaluation of allergic reaction.  She presents today with her mother. Discussed the use of AI scribe software for clinical note transcription with the patient, who gave verbal consent to proceed.  In December, she experienced a sudden allergic reaction after consuming four [employer] sugar cookies with red and green designs. Within thirty minutes, she felt her throat closing, became disoriented, and experienced significant facial swelling and itchiness. Her mother administered two doses of Benadryl , which provided some relief, but her symptoms persisted, leading to an urgent care visit the following day. At urgent care, she was prescribed hydroxyzine  and a Medrol  Dosepak, which helped resolve the swelling and itching. She has a history of a reaction approximately a year ago after consuming French fries and a hot dog at [employer], where she developed hives but did not experience facial swelling. She has since avoided the cookies that triggered the December reaction.  She has eaten foods containing wheat like breads, potatoes, red dye 20 minutes, eggs, dairy since the recent reaction without issue.  She regularly consumes foods containing red dye, such as fruit punch and candies, without issue, suggesting the reaction may not be related to the dye. She also mentioned eating various foods around the time of the December reaction, including ham, green beans, corn, and cranberry sauce, which she consumes regularly without problems.  She takes Zyrtec daily for allergies, which helps with her symptoms, although her eyes still get itchy, possibly due to her cat sleeping on her head. She also uses a neti pot during certain times of  the year to manage nasal symptoms.  Her past medical history includes eczema, which has improved over time but still causes occasional patches, and a history of respiratory issues in childhood requiring inhalers. She had pneumonia in late October or early November, and a viral infection shortly before the December reaction.  She works at [employer] during [employer], which exposes her to dirt, dust, and smoke during [employer] season..  She has a history of seasonal allergies with symptoms including runny nose, itchy and watery eyes, and sneezing. No history of food allergies as a child but has experienced two recent episodes of suspected food-related allergic reactions.      Review of systems: 10pt ROS negative unless noted above in HPI  All other systems negative unless noted above in HPI  Past medical history: Past Medical History:  Diagnosis Date   ADHD (attention deficit hyperactivity disorder)    Allergy     Angio-edema    Anxiety    Eczema    Hepatic steatosis    Recurrent upper respiratory infection (URI)    Urticaria     Past surgical history: Past Surgical History:  Procedure Laterality Date   ADENOIDECTOMY     LIVER BIOPSY  04/2023   MYRINGOTOMY WITH TUBE PLACEMENT     TONSILLECTOMY     tubes     TYMPANOSTOMY TUBE PLACEMENT     UPPER GASTROINTESTINAL ENDOSCOPY     wisdom teeth      Family history:  Family History  Adopted: Yes    Social history: Lives in a home with out carpeting with gas heating and central cooling. Cats and dogs in the home.  No concern for  water  damage, mildew or roaches in the home.  She is a Comptroller.  Denies a smoking history.  Medication List: Current Outpatient Medications  Medication Sig Dispense Refill   cetirizine (ZYRTEC) 10 MG tablet Take 10 mg by mouth daily.     Clindamycin -Benzoyl Per, Refr, gel Apply a small amount to skin topically in the morning. 45 g 11   Cranberry 1000 MG CAPS      drospirenone -ethinyl  estradiol  (YASMIN ) 3-0.03 MG tablet Take 1 tablet by mouth once a day 84 tablet 5   EPINEPHrine  0.3 mg/0.3 mL IJ SOAJ injection Inject 0.3 mg into the muscle as needed for anaphylaxis. 2 each 1   lisdexamfetamine (VYVANSE ) 30 MG capsule Take 1 capsule (30 mg total) by mouth in the morning. 30 capsule 0   Olopatadine-Mometasone (RYALTRIS ) 665-25 MCG/ACT SUSP Place 2 sprays into the nose 2 (two) times daily as needed. 29 g 5   omeprazole  (PRILOSEC) 40 MG capsule Take 1 capsule (40 mg total) by mouth 2 (two) times daily. 60 capsule 2   ondansetron  (ZOFRAN -ODT) 4 MG disintegrating tablet Dissolve 1 tablet (4 mg total) in mouth every 8 (eight) hours as needed. 20 tablet 0   sertraline  (ZOLOFT ) 100 MG tablet Take 1 tablet (100 mg total) by mouth daily. 90 tablet 1   Spacer/Aero-Holding Chambers (AEROCHAMBER PLUS WITH MASK) inhaler Use as directed 1 each 0   sucralfate  (CARAFATE ) 1 g tablet Take 1 tablet (1 g total) by mouth 3 (three) times daily as needed. 90 tablet 0   traZODone  (DESYREL ) 50 MG tablet Take 1 tablet (50 mg total) by mouth at bedtime as needed. 90 tablet 1   tretinoin  (RETIN-A ) 0.05 % cream Apply a small amount to skin every night. 20 g 11   albuterol  (VENTOLIN  HFA) 108 (90 Base) MCG/ACT inhaler Inhale 1-2 puffs into the lungs every 4 (four) hours if needed. (Patient not taking: Reported on 01/31/2024) 6.7 g 1   Cholecalciferol (VITAMIN D3) 2000 units TABS Take 2,000 Units by mouth daily. (Patient not taking: Reported on 01/31/2024)     fluconazole  (DIFLUCAN ) 150 MG tablet Take 1 tablet (150 mg total) by mouth once, for 1 dose. (Patient not taking: Reported on 01/31/2024) 1 tablet 2   hydrOXYzine  (ATARAX ) 25 MG tablet Take 1 tablet (25 mg total) by mouth 3 (three) times daily as needed for itching for 7 days (Patient not taking: Reported on 01/31/2024) 21 tablet 0   promethazine -dextromethorphan (PROMETHAZINE -DM) 6.25-15 MG/5ML syrup Take 5 mLs by mouth 4 (four) times daily as needed. (Patient not  taking: Reported on 01/31/2024) 118 mL 0   tretinoin  (RETIN-A ) 0.05 % cream Apply 1 application (a small amount) topically to skin every night (Patient not taking: Reported on 01/31/2024) 20 g 2   tretinoin  (RETIN-A ) 0.05 % cream Apply 1 application (a small amount) topically every evening. (Patient not taking: Reported on 01/31/2024) 20 g 11   vitamin B-12 (CYANOCOBALAMIN ) 1000 MCG tablet Take 1,000 mcg by mouth daily. (Patient not taking: Reported on 01/31/2024)     No current facility-administered medications for this visit.    Known medication allergies: Allergies  Allergen Reactions   Amoxicillin -Pot Clavulanate Diarrhea    GI upset     Physical examination: Blood pressure 102/80, pulse 82, temperature 97.8 F (36.6 C), temperature source Temporal, resp. rate 18, height 5' (1.524 m), weight 195 lb 8 oz (88.7 kg), SpO2 97%.  General: Alert, interactive, in no acute distress. HEENT: PERRLA, TMs pearly gray, turbinates  moderately edematous with clear discharge, post-pharynx non erythematous. Neck: Supple without lymphadenopathy. Lungs: Clear to auscultation without wheezing, rhonchi or rales. {no increased work of breathing. CV: Normal S1, S2 without murmurs. Abdomen: Nondistended, nontender. Skin: Warm and dry, without lesions or rashes. Extremities:  No clubbing, cyanosis or edema. Neuro:   Grossly intact.  Diagnositics/Labs: None today  Assessment and plan:   Allergic reaction Recent severe allergic reaction with angioedema, urticaria, and respiratory symptoms after eating [employer]ills[employer] sugar cookies. Previous milder reaction after eating a hot dog and fries. Discussed the consistency and immediacy of food allergies, and the possibility of red meat allergy  (alpha-gal syndrome) which can be inconsistent and delayed.  Not concerned for wheat, egg, or food dyes as you have been tolerating these food items without issue.  -Order alpha-gal panel and tryptase level to rule out red meat  allergy  and hyperactive allergy  cells. -Recommend carrying an EpiPen  device for severe allergic reactions.  Follow emergency action plan in case of allergic reaction -Should significant symptoms recur or new symptoms occur, a journal is to be kept recording any foods eaten, beverages consumed, medications taken, activities performed, and environmental conditions within a 6 hour time period prior to the onset of symptoms. For any symptoms concerning for anaphylaxis, epinephrine  is to be administered and 911 is to be called immediately.   Rhinoconjunctivitis Chronic symptoms of runny nose, itchy eyes, and throat clearing. Currently taking Zyrtec. -Consider Ryaltris  nasal spray if covered by insurance, otherwise recommend a combination of a steroid nasal spray and Astepro . Ryaltris  2 sprays each nostril twice a day as needed for runny or stuffy nose symptoms -Will obtain environmental allergen testing to identify triggers.  Eczema History of eczema with occasional patches. -Daily moisturization especially after bathing  Nosebleeds Frequent nosebleeds due to dryness and frequent nose blowing. -Consider regular use of a neti pot (saline rinse) to clean nasal cavity and improve efficacy of nasal sprays.  Also helps with moisturizing the nasal cavity  Follow-up 6 months or sooner if needed  I appreciate the opportunity to take part in [facility] care. Please do not hesitate to contact me with questions.  Sincerely,   Danita Brain, MD Allergy /Immunology [facility]

## 2024-01-31 NOTE — Patient Instructions (Addendum)
 Allergic reaction Recent severe allergic reaction with angioedema, urticaria, and respiratory symptoms after eating [employer]illsbury sugar cookies. Previous milder reaction after eating a hot dog and fries. Discussed the consistency and immediacy of food allergies, and the possibility of red meat allergy  (alpha-gal syndrome) which can be inconsistent and delayed.  Not concerned for wheat, egg, or food dyes as you have been tolerating these food items without issue.  -Order alpha-gal panel and tryptase level to rule out red meat allergy  and hyperactive allergy  cells. -Recommend carrying an EpiPen  device for severe allergic reactions.  Follow emergency action plan in case of allergic reaction -Should significant symptoms recur or new symptoms occur, a journal is to be kept recording any foods eaten, beverages consumed, medications taken, activities performed, and environmental conditions within a 6 hour time period prior to the onset of symptoms. For any symptoms concerning for anaphylaxis, epinephrine  is to be administered and 911 is to be called immediately.   Allergic Rhinitis Chronic symptoms of runny nose, itchy eyes, and throat clearing. Currently taking Zyrtec. -Consider Ryaltris  nasal spray if covered by insurance, otherwise recommend a combination of a steroid nasal spray and Astepro . Ryaltris  2 sprays each nostril twice a day as needed for runny or stuffy nose symptoms -Will obtain environmental allergen testing to identify triggers.  Eczema History of eczema with occasional patches. -Daily moisturization especially after bathing  Nosebleeds Frequent nosebleeds due to dryness and frequent nose blowing. -Consider regular use of a neti pot (saline rinse) to clean nasal cavity and improve efficacy of nasal sprays.  Also helps with moisturizing the nasal cavity  Follow-up 6 months or sooner if needed

## 2024-02-04 LAB — ALPHA-GAL PANEL: IgE (Immunoglobulin E), Serum: 6 [IU]/mL (ref 6–495)

## 2024-02-04 LAB — ALLERGENS W/TOTAL IGE AREA 2
D Farinae IgE: 1.79 kU/L — AB
D Pteronyssinus IgE: 1.11 kU/L — AB

## 2024-02-04 LAB — TRYPTASE: Tryptase: 5 ug/L (ref 2.2–13.2)

## 2024-02-06 ENCOUNTER — Encounter: Payer: Self-pay | Admitting: Allergy

## 2024-02-08 ENCOUNTER — Ambulatory Visit: Payer: Commercial Managed Care - PPO | Admitting: Allergy

## 2024-02-08 ENCOUNTER — Encounter: Payer: Self-pay | Admitting: Allergy

## 2024-02-08 DIAGNOSIS — J302 Other seasonal allergic rhinitis: Secondary | ICD-10-CM

## 2024-02-08 DIAGNOSIS — T7840XD Allergy, unspecified, subsequent encounter: Secondary | ICD-10-CM

## 2024-02-08 DIAGNOSIS — J3089 Other allergic rhinitis: Secondary | ICD-10-CM

## 2024-02-08 NOTE — Patient Instructions (Addendum)
Allergic reaction Recent severe allergic reaction with angioedema, urticaria, and respiratory symptoms after eating [employer] sugar cookies. Previous milder reaction after eating a hot dog and fries. Discussed the consistency and immediacy of food allergies.  Not concerned for wheat, egg, or food dyes as you have been tolerating these food items without issue.  -alpha gal panel was negative thus does not have red meat allergy.  -tryptase level was normal thus does not have hyperactive allergy cells -food allergy skin testing today shows a small positive IgE signal to [place].  Would avoid and see if you have no further reaction episodes.  -Have access to EpiPen device for severe allergic reactions.  Follow emergency action plan in case of allergic reaction. -Should significant symptoms recur or new symptoms occur, a journal is to be kept recording any foods eaten, beverages consumed, medications taken, activities performed, and environmental conditions within a 6 hour time period prior to the onset of symptoms. For any symptoms concerning for anaphylaxis, epinephrine is to be administered and 911 is to be called immediately.   Allergic Rhinitis Chronic symptoms of runny nose, itchy eyes, and throat clearing. Currently taking Zyrtec. -Use Ryaltris nasal spray if covered by insurance, otherwise recommend a combination of a steroid nasal spray and Astepro. Ryaltris 2 sprays each nostril twice a day as needed for runny or stuffy nose symptoms -Environmental allergy panel by blood work was positive to dust mites.  Allergy panel by skin testing today is positive to weed pollen, grass pollen, molds, dust mite, horse.   Avoidance measures provided  Eczema History of eczema with occasional patches. -Daily moisturization especially after bathing  Nosebleeds Frequent nosebleeds due to dryness and frequent nose blowing. -Consider regular use of a neti pot (saline rinse) to clean nasal cavity and improve  efficacy of nasal sprays.  Also helps with moisturizing the nasal cavity  Follow-up 6 months or sooner if needed

## 2024-02-08 NOTE — Progress Notes (Signed)
Follow-up Note  RE: Leslie [name] MRN: [id number] DOB: 04/14/2001 Date of Office Visit: 02/08/2024   History of present illness: Leslie [name] is a 23 y.o. female presenting today for skin testing visit.  She was last seen in the office on 01/31/24 by myself.  He is in her usual state of health today without any recent antibiotic use.  She has held antihistamines for at least 3 days for testing  today.   Medication List: Current Outpatient Medications  Medication Sig Dispense Refill   albuterol (VENTOLIN HFA) 108 (90 Base) MCG/ACT inhaler Inhale 1-2 puffs into the lungs every 4 (four) hours if needed. (Patient not taking: Reported on 01/31/2024) 6.7 g 1   cetirizine (ZYRTEC) 10 MG tablet Take 10 mg by mouth daily.     Cholecalciferol (VITAMIN D3) 2000 units TABS Take 2,000 Units by mouth daily. (Patient not taking: Reported on 01/31/2024)     Clindamycin-Benzoyl Per, Refr, gel Apply a small amount to skin topically in the morning. 45 g 11   Cranberry 1000 MG CAPS      drospirenone-ethinyl estradiol (YASMIN) 3-0.03 MG tablet Take 1 tablet by mouth once a day 84 tablet 5   EPINEPHrine 0.3 mg/0.3 mL IJ SOAJ injection Inject 0.3 mg into the muscle as needed for anaphylaxis. 2 each 1   fluconazole (DIFLUCAN) 150 MG tablet Take 1 tablet (150 mg total) by mouth once, for 1 dose. (Patient not taking: Reported on 01/31/2024) 1 tablet 2   hydrOXYzine (ATARAX) 25 MG tablet Take 1 tablet (25 mg total) by mouth 3 (three) times daily as needed for itching for 7 days (Patient not taking: Reported on 01/31/2024) 21 tablet 0   lisdexamfetamine (VYVANSE) 30 MG capsule Take 1 capsule (30 mg total) by mouth in the morning. 30 capsule 0   Olopatadine-Mometasone (RYALTRIS) 665-25 MCG/ACT SUSP Place 2 sprays into the nose 2 (two) times daily as needed. 29 g 5   omeprazole (PRILOSEC) 40 MG capsule Take 1 capsule (40 mg total) by mouth 2 (two) times daily. 60 capsule 2   ondansetron (ZOFRAN-ODT) 4 MG  disintegrating tablet Dissolve 1 tablet (4 mg total) in mouth every 8 (eight) hours as needed. 20 tablet 0   promethazine-dextromethorphan (PROMETHAZINE-DM) 6.25-15 MG/5ML syrup Take 5 mLs by mouth 4 (four) times daily as needed. (Patient not taking: Reported on 01/31/2024) 118 mL 0   sertraline (ZOLOFT) 100 MG tablet Take 1 tablet (100 mg total) by mouth daily. 90 tablet 1   Spacer/Aero-Holding Chambers (AEROCHAMBER PLUS WITH MASK) inhaler Use as directed 1 each 0   sucralfate (CARAFATE) 1 g tablet Take 1 tablet (1 g total) by mouth 3 (three) times daily as needed. 90 tablet 0   traZODone (DESYREL) 50 MG tablet Take 1 tablet (50 mg total) by mouth at bedtime as needed. 90 tablet 1   tretinoin (RETIN-A) 0.05 % cream Apply 1 application (a small amount) topically to skin every night (Patient not taking: Reported on 01/31/2024) 20 g 2   tretinoin (RETIN-A) 0.05 % cream Apply 1 application (a small amount) topically every evening. (Patient not taking: Reported on 01/31/2024) 20 g 11   tretinoin (RETIN-A) 0.05 % cream Apply a small amount to skin every night. 20 g 11   vitamin B-12 (CYANOCOBALAMIN) 1000 MCG tablet Take 1,000 mcg by mouth daily. (Patient not taking: Reported on 01/31/2024)     No current facility-administered medications for this visit.     Known medication allergies: Allergies  Allergen  Reactions   Amoxicillin-Pot Clavulanate Diarrhea    GI upset   Diagnositics/Labs: Labs:  Component     Latest Ref Rng 01/31/2024  D Pteronyssinus IgE     Class II kU/L 1.11 !   D Farinae IgE     Class III kU/L 1.79 !   Cat Dander IgE     Class 0 kU/L <0.10   Dog Dander IgE     Class 0 kU/L <0.10   [place] Grass IgE     Class 0 kU/L <0.10   Timothy Grass IgE     Class 0 kU/L <0.10   Johnson Grass IgE     Class 0 kU/L <0.10   Cockroach, German IgE     Class 0 kU/L <0.10   Penicillium Chrysogen IgE     Class 0 kU/L <0.10   Cladosporium Herbarum IgE     Class 0 kU/L <0.10   Aspergillus  Fumigatus IgE     Class 0 kU/L <0.10   Alternaria Alternata IgE     Class 0 kU/L <0.10   Maple/Box Elder IgE     Class 0 kU/L <0.10   Common Silver Charletta Cousin IgE     Class 0 kU/L <0.10   Cedar, [place] IgE     Class 0 kU/L <0.10   Oak, White IgE     Class 0 kU/L <0.10   Elm, American IgE     Class 0 kU/L <0.10   Cottonwood IgE     Class 0 kU/L <0.10   Pecan, Hickory IgE     Class 0 kU/L <0.10   White Mulberry IgE     Class 0 kU/L <0.10   Ragweed, Short IgE     Class 0 kU/L <0.10   Pigweed, Rough IgE     Class 0 kU/L <0.10   Sheep Sorrel IgE Qn     Class 0 kU/L <0.10   Mouse Urine IgE     Class 0 kU/L <0.10   Class Description Allergens Comment   IgE (Immunoglobulin E), Serum     6 - 495 IU/mL 6   Pork IgE     Class 0 kU/L <0.10   Beef IgE     Class 0 kU/L <0.10   Allergen Lamb IgE     Class 0 kU/L <0.10   O215-IgE Alpha-Gal     Class 0 kU/L <0.10   Tryptase     2.2 - 13.2 ug/L 5.0      Airborne Adult Perc - 02/08/24 0909     Time Antigen Placed 0909    Allergen Manufacturer Waynette Buttery    Location Back    Number of Test 55    Panel 1 Select    1. Control-Buffer 50% Glycerol Negative    2. Control-Histamine 2+    3. Bahia Negative    4. French Southern Territ[place] Negative    5. Johnson Negative    6. [place] Blue Negative    7. Meadow Fescue Negative    8. Perennial Rye Negative    9. Timothy 2+    10. Ragweed Mix Negative    11. Cocklebur 2+    12. Plantain,  English 2+    13. Baccharis 2+    14. Dog Fennel Negative    15. Russian Thistle Negative    16. Lamb's Quarters Negative    17. Sheep Sorrell Negative    18. Rough Pigweed Negative    19. Marsh Elder, Rough Negative    20. Mugwort, Common  Negative    21. Box, Elder Negative    22. Cedar, red Negative    23. Sweet Gum Negative    24. Pecan Pollen Negative    25. Pine Mix Negative    26. Walnut, Black Pollen Negative    27. Red Mulberry Negative    28. Ash Mix Negative    29. Birch Mix Negative    30. Beech  American Negative    31. Cottonwood, [place] Negative    32. Hickory, White Negative    33. Maple Mix Negative    34. Oak, [place] Mix Negative    35. Sycamore Eastern Negative    36. Alternaria Alternata Negative    37. Cladosporium Herbarum Negative    38. Aspergillus Mix 2+    39. Penicillium Mix 2+    40. Bipolaris Sorokiniana (Helminthosporium) 2+    41. Drechslera Spicifera (Curvularia) Negative    42. Mucor Plumbeus Negative    43. Fusarium Moniliforme Negative    44. Aureobasidium Pullulans (pullulara) Negative    45. Rhizopus Oryzae Negative    46. Botrytis Cinera Negative    47. Epicoccum Nigrum Negative    48. Phoma Betae Negative    49. Dust Mite Mix 2+    50. Cat Hair 10,000 BAU/ml Negative    51.  Dog Epithelia Negative    52. Mixed Feathers Negative    53. Horse Epithelia 2+    54. Cockroach, German Negative    55. Tobacco Leaf Negative             Food Adult Perc - 02/08/24 0900     Time Antigen Placed 1610    Allergen Manufacturer Waynette Buttery    Location Back    Number of allergen test 5    Control-Histamine 2+    3. Wheat Negative    11. Walnut Food Negative    12. Almond 2+    65. Pineapple Negative               Assessment and plan: Allergic reaction Recent severe allergic reaction with angioedema, urticaria, and respiratory symptoms after eating Pillsbury sugar cookies. Previous milder reaction after eating a hot dog and fries. Discussed the consistency and immediacy of food allergies.  Not concerned for wheat, egg, or food dyes as you have been tolerating these food items without issue.  -alpha gal panel was negative thus does not have red meat allergy.  -tryptase level was normal thus does not have hyperactive allergy cells -food allergy skin testing today shows a small positive IgE signal to Almond.  Would avoid and see if you have no further reaction episodes.  -Have access to EpiPen device for severe allergic reactions.  Follow emergency  action plan in case of allergic reaction. -Should significant symptoms recur or new symptoms occur, a journal is to be kept recording any foods eaten, beverages consumed, medications taken, activities performed, and environmental conditions within a 6 hour time period prior to the onset of symptoms. For any symptoms concerning for anaphylaxis, epinephrine is to be administered and 911 is to be called immediately.   Allergic Rhinitis Chronic symptoms of runny nose, itchy eyes, and throat clearing. Currently taking Zyrtec. -Use Ryaltris nasal spray if covered by insurance, otherwise recommend a combination of a steroid nasal spray and Astepro. Ryaltris 2 sprays each nostril twice a day as needed for runny or stuffy nose symptoms -Environmental allergy panel by blood work was positive to dust mites.  Allergy panel by skin testing today  is positive to weed pollen, grass pollen, molds, dust mite, horse.   Avoidance measures provided  Eczema History of eczema with occasional patches. -Daily moisturization especially after bathing  Nosebleeds Frequent nosebleeds due to dryness and frequent nose blowing. -Consider regular use of a neti pot (saline rinse) to clean nasal cavity and improve efficacy of nasal sprays.  Also helps with moisturizing the nasal cavity  Follow-up 6 months or sooner if needed  I appreciate the opportunity to take part in [facility] care. Please do not hesitate to contact me with questions.  Sincerely,   Margo Aye, MD Allergy/Immunology [facility]

## 2024-02-20 ENCOUNTER — Other Ambulatory Visit (HOSPITAL_COMMUNITY): Payer: Self-pay

## 2024-02-20 ENCOUNTER — Other Ambulatory Visit: Payer: Self-pay | Admitting: Internal Medicine

## 2024-02-20 ENCOUNTER — Encounter: Payer: Self-pay | Admitting: Internal Medicine

## 2024-02-20 MED ORDER — SUCRALFATE 1 G PO TABS
1.0000 g | ORAL_TABLET | Freq: Three times a day (TID) | ORAL | 0 refills | Status: DC | PRN
  Filled 2024-02-20: qty 90, 30d supply, fill #0

## 2024-02-21 ENCOUNTER — Other Ambulatory Visit (HOSPITAL_COMMUNITY): Payer: Self-pay

## 2024-02-21 ENCOUNTER — Ambulatory Visit: Payer: Commercial Managed Care - PPO | Admitting: Internal Medicine

## 2024-02-21 ENCOUNTER — Other Ambulatory Visit (INDEPENDENT_AMBULATORY_CARE_PROVIDER_SITE_OTHER): Payer: Commercial Managed Care - PPO

## 2024-02-21 ENCOUNTER — Encounter: Payer: Self-pay | Admitting: Internal Medicine

## 2024-02-21 VITALS — BP 110/74 | HR 90 | Ht 60.0 in | Wt 194.0 lb

## 2024-02-21 DIAGNOSIS — K760 Fatty (change of) liver, not elsewhere classified: Secondary | ICD-10-CM | POA: Diagnosis not present

## 2024-02-21 DIAGNOSIS — K7581 Nonalcoholic steatohepatitis (NASH): Secondary | ICD-10-CM

## 2024-02-21 DIAGNOSIS — R112 Nausea with vomiting, unspecified: Secondary | ICD-10-CM | POA: Diagnosis not present

## 2024-02-21 DIAGNOSIS — R1013 Epigastric pain: Secondary | ICD-10-CM

## 2024-02-21 DIAGNOSIS — R7989 Other specified abnormal findings of blood chemistry: Secondary | ICD-10-CM

## 2024-02-21 LAB — CBC WITH DIFFERENTIAL/PLATELET
Basophils Absolute: 0.1 10*3/uL (ref 0.0–0.1)
Basophils Relative: 1 % (ref 0.0–3.0)
Eosinophils Absolute: 0.5 10*3/uL (ref 0.0–0.7)
Eosinophils Relative: 3.9 % (ref 0.0–5.0)
HCT: 42.6 % (ref 36.0–46.0)
Hemoglobin: 14.1 g/dL (ref 12.0–15.0)
Lymphocytes Relative: 25.1 % (ref 12.0–46.0)
Lymphs Abs: 3 10*3/uL (ref 0.7–4.0)
MCHC: 33.2 g/dL (ref 30.0–36.0)
MCV: 85.6 fL (ref 78.0–100.0)
Monocytes Absolute: 0.8 10*3/uL (ref 0.1–1.0)
Monocytes Relative: 6.6 % (ref 3.0–12.0)
Neutro Abs: 7.6 10*3/uL (ref 1.4–7.7)
Neutrophils Relative %: 63.4 % (ref 43.0–77.0)
Platelets: 322 10*3/uL (ref 150.0–400.0)
RBC: 4.98 Mil/uL (ref 3.87–5.11)
RDW: 14 % (ref 11.5–15.5)
WBC: 12 10*3/uL — ABNORMAL HIGH (ref 4.0–10.5)

## 2024-02-21 LAB — HEPATIC FUNCTION PANEL
ALT: 183 U/L — ABNORMAL HIGH (ref 0–35)
AST: 152 U/L — ABNORMAL HIGH (ref 0–37)
Albumin: 4.4 g/dL (ref 3.5–5.2)
Alkaline Phosphatase: 122 U/L — ABNORMAL HIGH (ref 39–117)
Bilirubin, Direct: 0.1 mg/dL (ref 0.0–0.3)
Total Bilirubin: 0.4 mg/dL (ref 0.2–1.2)
Total Protein: 8.1 g/dL (ref 6.0–8.3)

## 2024-02-21 LAB — TSH: TSH: 1.48 u[IU]/mL (ref 0.35–5.50)

## 2024-02-21 LAB — HEMOGLOBIN A1C: Hgb A1c MFr Bld: 5.8 % (ref 4.6–6.5)

## 2024-02-21 LAB — LIPASE: Lipase: 8 U/L — ABNORMAL LOW (ref 11.0–59.0)

## 2024-02-21 MED ORDER — SUCRALFATE 1 G PO TABS
1.0000 g | ORAL_TABLET | Freq: Three times a day (TID) | ORAL | 0 refills | Status: DC | PRN
  Filled 2024-03-01: qty 90, 30d supply, fill #0

## 2024-02-21 NOTE — Patient Instructions (Signed)
 Your provider has requested that you go to the basement level for lab work before leaving today. Press "B" on the elevator. The lab is located at the first door on the left as you exit the elevator.  We have sent the following medications to your pharmacy for you to pick up at your convenience: Carafate  You have been scheduled for a gastric emptying scan at [facility] on 02/26/24 at 8 am. Please arrive at least 30 minutes prior to your appointment for registration. Please make certain not to have anything to eat or drink after midnight the night before your test. Hold all stomach medications (ex: Zofran, phenergan, Reglan) 24 hours prior to your test. If you need to reschedule your appointment, please contact radiology scheduling at [phone]. _____________________________________________________________________ A gastric-emptying study measures how long it takes for food to move through your stomach. There are several ways to measure stomach emptying. In the most common test, you eat food that contains a small amount of radioactive material. A scanner that detects the movement of the radioactive material is placed over your abdomen to monitor the rate at which food leaves your stomach. This test normally takes about 4 hours to complete. _____________________________________________________________________  Leslie [name] have been scheduled for an abdominal ultrasound at [facility] (1st floor of hospital) on 02/27/24 at 7:30 am. Please arrive 30 minutes prior to your appointment for registration. Make certain not to have anything to eat or drink after midnight Should you need to reschedule your appointment, please contact radiology at [phone]. This test typically takes about 30 minutes to perform.   _______________________________________________________  If your blood pressure at your visit was 140/90 or greater, please contact your primary care physician to follow up on  this.  _______________________________________________________  If you are age 65 or older, your body mass index should be between 23-30. Your Body mass index is 37.89 kg/m. If this is out of the aforementioned range listed, please consider follow up with your Primary Care Provider.  If you are age 35 or younger, your body mass index should be between 19-25. Your Body mass index is 37.89 kg/m. If this is out of the aformentioned range listed, please consider follow up with your Primary Care Provider.   ________________________________________________________  The [facility] providers would like to encourage you to use [facility] to communicate with providers for non-urgent requests or questions.  Due to long hold times on the telephone, sending your provider a message by [facility] may be a faster and more efficient way to get a response.  Please allow 48 business hours for a response.  Please remember that this is for non-urgent requests.  _______________________________________________________  Due to recent changes in healthcare laws, you may see the results of your imaging and laboratory studies on MyChart before your provider has had a chance to review them.  We understand that in some cases there may be results that are confusing or concerning to you. Not all laboratory results come back in the same time frame and the provider may be waiting for multiple results in order to interpret others.  Please give [place] 48 hours in order for your provider to thoroughly review all the results before contacting the office for clarification of your results.   Thank you for entrusting me with your care and for choosing [facility], Dr. Eulah Pont

## 2024-02-21 NOTE — Progress Notes (Unsigned)
 Chief Complaint: Fatty liver  HPI : 23 year old female with history of fatty liver, ADHD, prior PUD, and anxiety presents for follow-up of hepatomegaly and fatty liver  She is currently in nursing school  Interval History: She feels like the omeprazole helped a lot. She is still taking it twice a day. Last week she started having twice daily pain. When she eats, she will get sick on her stomach and would throw up. She usually pretty regular but stools can be mushy or hard. She has 2 BMs per day on average. She has Zofran and it helped temporarily. She has been taking sucralfate. When she eats, she sometimes gets really full. 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 She felt dizzy one time during her classes. Denies headaches. Denies CBD chewies or marijuana. Denies alcohol use for over a year. Patient states that she is not pregnant. Denies burning when she urinates  Wt Readings from Last 3 Encounters:  01/31/24 195 lb 8 oz (88.7 kg)  09/21/23 186 lb (84.4 kg)  09/14/23 187 lb (84.8 kg)   Current Outpatient Medications  Medication Sig Dispense Refill   albuterol (VENTOLIN HFA) 108 (90 Base) MCG/ACT inhaler Inhale 1-2 puffs into the lungs every 4 (four) hours if needed. (Patient not taking: Reported on 01/31/2024) 6.7 g 1   cetirizine (ZYRTEC) 10 MG tablet Take 10 mg by mouth daily.     Cholecalciferol (VITAMIN D3) 2000 units TABS Take 2,000 Units by mouth daily. (Patient not taking: Reported on 01/31/2024)     Clindamycin-Benzoyl Per, Refr, gel Apply a small amount to skin topically in the morning. 45 g 11   Cranberry 1000 MG CAPS      drospirenone-ethinyl estradiol (YASMIN) 3-0.03 MG tablet Take 1 tablet by mouth once a day 84 tablet 5   EPINEPHrine 0.3 mg/0.3 mL IJ SOAJ injection Inject 0.3 mg into the muscle as needed for anaphylaxis. 2 each 1   fluconazole (DIFLUCAN) 150 MG tablet Take 1 tablet (150 mg total) by mouth once, for 1 dose. (Patient not taking: Reported on 01/31/2024) 1  tablet 2   hydrOXYzine (ATARAX) 25 MG tablet Take 1 tablet (25 mg total) by mouth 3 (three) times daily as needed for itching for 7 days (Patient not taking: Reported on 01/31/2024) 21 tablet 0   lisdexamfetamine (VYVANSE) 30 MG capsule Take 1 capsule (30 mg total) by mouth in the morning. 30 capsule 0   Olopatadine-Mometasone (RYALTRIS) 665-25 MCG/ACT SUSP Place 2 sprays into the nose 2 (two) times daily as needed. 29 g 5   omeprazole (PRILOSEC) 40 MG capsule Take 1 capsule (40 mg total) by mouth 2 (two) times daily. 60 capsule 2   ondansetron (ZOFRAN-ODT) 4 MG disintegrating tablet Dissolve 1 tablet (4 mg total) in mouth every 8 (eight) hours as needed. 20 tablet 0   promethazine-dextromethorphan (PROMETHAZINE-DM) 6.25-15 MG/5ML syrup Take 5 mLs by mouth 4 (four) times daily as needed. (Patient not taking: Reported on 01/31/2024) 118 mL 0   sertraline (ZOLOFT) 100 MG tablet Take 1 tablet (100 mg total) by mouth daily. 90 tablet 1   Spacer/Aero-Holding Chambers (AEROCHAMBER PLUS WITH MASK) inhaler Use as directed 1 each 0   sucralfate (CARAFATE) 1 g tablet Take 1 tablet (1 g total) by mouth 3 (three) times daily as needed. 90 tablet 0   traZODone (DESYREL) 50 MG tablet Take 1 tablet (50 mg total) by mouth at bedtime as needed. 90 tablet 1   tretinoin (RETIN-A)  0.05 % cream Apply 1 application (a small amount) topically to skin every night (Patient not taking: Reported on 01/31/2024) 20 g 2   tretinoin (RETIN-A) 0.05 % cream Apply 1 application (a small amount) topically every evening. (Patient not taking: Reported on 01/31/2024) 20 g 11   tretinoin (RETIN-A) 0.05 % cream Apply a small amount to skin every night. 20 g 11   vitamin B-12 (CYANOCOBALAMIN) 1000 MCG tablet Take 1,000 mcg by mouth daily. (Patient not taking: Reported on 01/31/2024)     No current facility-administered medications for this visit.   Physical Exam: There were no vitals taken for this visit. Constitutional: Pleasant,well-developed,  female in no acute distress. HEENT: Normocephalic and atraumatic. Conjunctivae are normal. No scleral icterus. Cardiovascular: Normal rate, regular rhythm.  Pulmonary/chest: Effort normal and breath sounds normal. No wheezing, rales or rhonchi. Abdominal: Soft, nondistended, tender in the epigastric area. Bowel sounds active throughout. There are no masses palpable. No hepatomegaly. Extremities: No edema Neurological: Alert and oriented to person place and time. Skin: Skin is warm and dry. No rashes noted. Psychiatric: Normal mood and affect. Behavior is normal.  Labs 09/2022: CBC with elevated WBC of 11.2 and nml platelets. Iron sat low at 11.2%. Ferritin 126.   Labs 10/2022: CMP with elevated AST of 48 and elevated ALT 63.  Labs 11/2022: ANA mildly elevated at 1:80. Hep A antibody positive. Hep B surface antibody positive. Hepatitis B surface antigen negative. HCV antibody negative. Ceruloplasmin nml. ASMA negative. IgG nml. INR nml. TTG IgA negative. IgA nml.   [facility] 02/2023: CMP with elevated AST 55 and ALT 120 (was as high as AST 246 and ALT 211). CBC with elevated WBC of 14.1. Lipase nml. Urine pregnancy test negative. U/A nml.   Labs 03/2023: LFTs with elevated AST 76 and ALT 102. IgG nml. ANA positive at 1:80. ASMA negative.  CT A/P w/contrast 10/26/22: IMPRESSION: 1. Ill-defined heterogeneous area in the anterior lower pole of the right kidney. This may represent pyelonephritis. Recommend correlation with urinalysis. If there is no clinical or laboratory evidence of urinary tract infection, recommend further assessment with MRI to exclude ill-defined lesion. 2. Hepatomegaly and advanced hepatic steatosis. 3. Normal appendix.  MR Abdomen w/contrast 11/20/22: IMPRESSION: 1. No signal abnormality or abnormal contrast enhancement of the inferior pole of the right kidney to correspond to finding of prior CT. This may have reflected since resolved pyelonephritis. 2. Severe hepatic  steatosis.  RUQ U/S with elastograpy 12/12/22: IMPRESSION: ULTRASOUND RUQ: Severe hepatic steatosis without acute findings. ULTRASOUND HEPATIC ELASTOGRAPHY: Median kPa:  7.8 Diagnostic category: < or = 9 kPa: in the absence of other known clinical signs, rules out cACLD  CT A/P w/contrast 03/15/23: IMPRESSION: 1. Hepatomegaly and hepatic steatosis. 2. Small left adnexal cyst, likely ovarian in origin. No follow-up imaging is recommended. This recommendation follows ACR consensus  Liver biopsy 05/02/23: A. LIVER, LEFT LOBE, NEEDLE CORE BIOPSY:  - Moderately active steatohepatitis (grade 2 of 3), see comment  - Mild fibrosis (stage 1 of 4) COMMENT: The biopsy is adequate for review and consists of several cores of liver with a preserved architecture.  Hepatic lobules show marked steatosis (about 75%) with moderate ballooning degeneration and few scattered poorly formed Mallory hyalines.  In addition, there are rare foci of lobular necroinflammation. Portal tracts are mostly unremarkable with preserved biliary and vascular structures and minimal inflammation. Iron stain shows no abnormal iron accumulation. Trichrome and reticulin stains highlight the presence of centrilobular chicken wire-like fibrosis and minimal portal  fibrosis. PASD stain shows no globular inclusions in hepatocytes. The findings are consistent with moderately active steatohepatitis, alcoholic and/or non-alcoholic, with mild fibrosis.   EGD 05/10/23: - Normal esophagus. - Gastritis. Biopsied. - Normal examined duodenum. Path: Surgical [P], gastric bx - ANTRAL MUCOSA WITH MILD CHEMICAL/REACTIVE CHANGE. - OXYNTIC MUCOSA WITH NO SIGNIFICANT PATHOLOGY. - NO HELICOBACTER PYLORI ORGANISMS IDENTIFIED ON H&E STAINED SLIDE.  ASSESSMENT AND PLAN: Epigastric ab pain Fatty liver Hepatomegaly Elevated LFTs History of PUD Patient presents with follow-up for epigastric abdominal pain as well as fatty liver.  On recent  LFTs, she was noted to have more significantly elevated AST and ALT in the 200s range.  Unclear what prompted this elevation LFTs since patient states that she has been avoiding alcohol use and attempting to lose weight.  Since patient has had a mildly elevated ANA in the past, will recheck her autoimmune hepatitis labs today and plan for a liver biopsy in the future to better delineate what the source of her elevated LFTs is due to.  Since patient does have history of gastric ulcer as a child, will plan for an EGD for further evaluation of her epigastric abdominal pain.  This abdominal pain seemed to improve on sucralfate therapy. - Check CBC with diff, LFTs, lipase, TSH, HbA1C  - Encourage weight loss - Encourage 2 cups of coffee consumption per day - Cont omeprazole 40 mg BID - Restart sucralfate tablet 1 TID PRN - Check RUQ U/S  - Check gastric emptying study - Continue to avoid alcohol - RTC in 2 months  Eulah Pont, MD  I spent 40 minutes of time, including in depth chart review, independent review of results as outlined above, communicating results with the patient directly, face-to-face time with the patient, coordinating care, ordering studies and medications as appropriate, and documentation.

## 2024-02-22 ENCOUNTER — Encounter: Payer: Self-pay | Admitting: Internal Medicine

## 2024-02-23 ENCOUNTER — Encounter: Payer: Self-pay | Admitting: Internal Medicine

## 2024-02-23 ENCOUNTER — Other Ambulatory Visit (HOSPITAL_COMMUNITY): Payer: Self-pay

## 2024-02-23 ENCOUNTER — Encounter (HOSPITAL_COMMUNITY)
Admission: RE | Admit: 2024-02-23 | Discharge: 2024-02-23 | Disposition: A | Discharge status: Home / Self Care | Payer: Commercial Managed Care - PPO | Source: Ambulatory Visit | Attending: Internal Medicine | Admitting: Internal Medicine

## 2024-02-23 DIAGNOSIS — R7989 Other specified abnormal findings of blood chemistry: Secondary | ICD-10-CM | POA: Diagnosis not present

## 2024-02-23 DIAGNOSIS — R109 Unspecified abdominal pain: Secondary | ICD-10-CM | POA: Diagnosis not present

## 2024-02-23 DIAGNOSIS — R1013 Epigastric pain: Secondary | ICD-10-CM | POA: Insufficient documentation

## 2024-02-23 DIAGNOSIS — K760 Fatty (change of) liver, not elsewhere classified: Secondary | ICD-10-CM | POA: Diagnosis not present

## 2024-02-23 MED ORDER — TECHNETIUM TC 99M SULFUR COLLOID
2.0000 | Freq: Once | INTRAVENOUS | Status: AC | PRN
  Administered 2024-02-23: 2 via ORAL

## 2024-02-26 ENCOUNTER — Ambulatory Visit (HOSPITAL_COMMUNITY)
Admission: RE | Admit: 2024-02-26 | Discharge: 2024-02-26 | Disposition: A | Discharge status: Home / Self Care | Payer: Commercial Managed Care - PPO | Source: Ambulatory Visit | Attending: Internal Medicine | Admitting: Internal Medicine

## 2024-02-26 DIAGNOSIS — R1013 Epigastric pain: Secondary | ICD-10-CM | POA: Diagnosis not present

## 2024-02-26 DIAGNOSIS — R7989 Other specified abnormal findings of blood chemistry: Secondary | ICD-10-CM | POA: Diagnosis not present

## 2024-02-26 DIAGNOSIS — R109 Unspecified abdominal pain: Secondary | ICD-10-CM | POA: Diagnosis not present

## 2024-02-26 DIAGNOSIS — K760 Fatty (change of) liver, not elsewhere classified: Secondary | ICD-10-CM | POA: Insufficient documentation

## 2024-02-27 ENCOUNTER — Other Ambulatory Visit (HOSPITAL_COMMUNITY): Payer: Commercial Managed Care - PPO

## 2024-02-27 ENCOUNTER — Encounter: Payer: Self-pay | Admitting: Internal Medicine

## 2024-02-27 NOTE — Progress Notes (Signed)
 Hi Beth, let's call the patient and see if she would want to do a HIDA scan in order to rule out biliary dyskinesia as a source of her symptoms.

## 2024-02-28 ENCOUNTER — Other Ambulatory Visit: Payer: Self-pay

## 2024-02-28 DIAGNOSIS — K828 Other specified diseases of gallbladder: Secondary | ICD-10-CM

## 2024-02-28 DIAGNOSIS — R1011 Right upper quadrant pain: Secondary | ICD-10-CM

## 2024-03-01 ENCOUNTER — Other Ambulatory Visit (HOSPITAL_COMMUNITY): Payer: Self-pay

## 2024-03-01 ENCOUNTER — Other Ambulatory Visit: Payer: Self-pay | Admitting: Internal Medicine

## 2024-03-01 ENCOUNTER — Other Ambulatory Visit: Payer: Self-pay | Admitting: Nurse Practitioner

## 2024-03-01 ENCOUNTER — Other Ambulatory Visit: Payer: Self-pay

## 2024-03-01 DIAGNOSIS — R110 Nausea: Secondary | ICD-10-CM

## 2024-03-04 ENCOUNTER — Other Ambulatory Visit (HOSPITAL_COMMUNITY): Payer: Self-pay

## 2024-03-04 ENCOUNTER — Other Ambulatory Visit: Payer: Self-pay

## 2024-03-04 MED ORDER — DROSPIRENONE-ETHINYL ESTRADIOL 3-0.03 MG PO TABS
1.0000 | ORAL_TABLET | Freq: Every day | ORAL | 2 refills | Status: DC
  Filled 2024-03-04: qty 84, 84d supply, fill #0

## 2024-03-04 MED ORDER — DROSPIRENONE-ETHINYL ESTRADIOL 3-0.03 MG PO TABS
1.0000 | ORAL_TABLET | Freq: Every day | ORAL | 0 refills | Status: DC
  Filled 2024-03-04: qty 84, 84d supply, fill #0

## 2024-03-04 MED ORDER — ONDANSETRON 4 MG PO TBDP
4.0000 mg | ORAL_TABLET | Freq: Three times a day (TID) | ORAL | 2 refills | Status: DC | PRN
  Filled 2024-03-04 – 2024-11-08 (×3): qty 20, 7d supply, fill #0

## 2024-03-04 NOTE — Telephone Encounter (Signed)
Needs annual exam

## 2024-03-06 ENCOUNTER — Other Ambulatory Visit (HOSPITAL_COMMUNITY): Payer: Self-pay

## 2024-03-07 ENCOUNTER — Ambulatory Visit (HOSPITAL_COMMUNITY)
Admission: RE | Admit: 2024-03-07 | Discharge: 2024-03-07 | Disposition: A | Discharge status: Home / Self Care | Source: Ambulatory Visit | Attending: Internal Medicine | Admitting: Internal Medicine

## 2024-03-07 DIAGNOSIS — R1011 Right upper quadrant pain: Secondary | ICD-10-CM | POA: Diagnosis not present

## 2024-03-07 DIAGNOSIS — K828 Other specified diseases of gallbladder: Secondary | ICD-10-CM | POA: Insufficient documentation

## 2024-03-07 MED ORDER — TECHNETIUM TC 99M MEBROFENIN IV KIT
5.4600 | PACK | Freq: Once | INTRAVENOUS | Status: AC
  Administered 2024-03-07: 5.46 via INTRAVENOUS

## 2024-03-08 ENCOUNTER — Encounter: Payer: Self-pay | Admitting: Internal Medicine

## 2024-03-09 ENCOUNTER — Other Ambulatory Visit (HOSPITAL_COMMUNITY): Payer: Self-pay

## 2024-03-11 ENCOUNTER — Other Ambulatory Visit (HOSPITAL_COMMUNITY): Payer: Self-pay

## 2024-03-28 ENCOUNTER — Ambulatory Visit (INDEPENDENT_AMBULATORY_CARE_PROVIDER_SITE_OTHER): Admitting: Nurse Practitioner

## 2024-03-28 ENCOUNTER — Encounter: Payer: Self-pay | Admitting: Nurse Practitioner

## 2024-03-28 VITALS — BP 116/76 | HR 94 | Ht 60.5 in | Wt 196.0 lb

## 2024-03-28 DIAGNOSIS — Z3A01 Less than 8 weeks gestation of pregnancy: Secondary | ICD-10-CM

## 2024-03-28 DIAGNOSIS — N912 Amenorrhea, unspecified: Secondary | ICD-10-CM

## 2024-03-28 DIAGNOSIS — Z3201 Encounter for pregnancy test, result positive: Secondary | ICD-10-CM

## 2024-03-28 LAB — PREGNANCY, URINE: Preg Test, Ur: POSITIVE — AB

## 2024-03-28 NOTE — Progress Notes (Signed)
   Acute Office Visit  Subjective:    Patient ID: Leslie [name], female    DOB: 22-Mar-2001, 23 y.o.   MRN: [id number]   HPI 23 y.o. G0 presents today for + home pregnancy test. Had 3 positives tests. Was on COCs but missed a couple of doses. Thinks her last period was in early February.   Patient's last menstrual period was 01/27/2024 (approximate). Period Cycle (Days): 28 Period Duration (Days): 3-4 Period Pattern: Regular Menstrual Flow: Moderate Menstrual Control: Maxi pad Dysmenorrhea: (!) Moderate Dysmenorrhea Symptoms: Cramping  Review of Systems  Constitutional: Negative.   Genitourinary: Negative.        Objective:    Physical Exam Constitutional:      Appearance: Normal appearance.   GU: Not indicated  BP 116/76 (BP Location: Left Arm, Patient Position: Sitting, Cuff Size: Normal)   Pulse 94   Ht 5' 0.5" (1.537 m)   Wt 196 lb (88.9 kg)   LMP 01/27/2024 (Approximate) Comment: Exact date unknown  SpO2 98%   BMI 37.65 kg/m  Wt Readings from Last 3 Encounters:  03/28/24 196 lb (88.9 kg)  02/21/24 194 lb (88 kg)  01/31/24 195 lb 8 oz (88.7 kg)        UPT+  Assessment & Plan:   Problem List Items Addressed This Visit   None Visit Diagnoses       Less than [redacted] weeks gestation of pregnancy    -  Primary     Amenorrhea       Relevant Orders   Pregnancy, urine (Completed)      Plan: + UPT. Has appointment scheduled with [facility]. OK to continue Zoloft. Discontinue Hydroxyzine, Vyvanse, Trazodone. Discussed pregnancy-safe behaviors, what to avoid. All questions answered.   Return if symptoms worsen or fail to improve.    Olivia Mackie DNP, 4:15 PM 03/28/2024

## 2024-04-02 ENCOUNTER — Emergency Department (HOSPITAL_BASED_OUTPATIENT_CLINIC_OR_DEPARTMENT_OTHER)

## 2024-04-02 ENCOUNTER — Emergency Department (HOSPITAL_BASED_OUTPATIENT_CLINIC_OR_DEPARTMENT_OTHER)
Admission: EM | Admit: 2024-04-02 | Discharge: 2024-04-03 | Disposition: A | Discharge status: Home / Self Care | Attending: Emergency Medicine | Admitting: Emergency Medicine

## 2024-04-02 DIAGNOSIS — R102 Pelvic and perineal pain: Secondary | ICD-10-CM | POA: Insufficient documentation

## 2024-04-02 DIAGNOSIS — Z3A08 8 weeks gestation of pregnancy: Secondary | ICD-10-CM | POA: Diagnosis not present

## 2024-04-02 DIAGNOSIS — O26891 Other specified pregnancy related conditions, first trimester: Secondary | ICD-10-CM | POA: Insufficient documentation

## 2024-04-02 DIAGNOSIS — R109 Unspecified abdominal pain: Secondary | ICD-10-CM | POA: Diagnosis not present

## 2024-04-02 DIAGNOSIS — Z3A01 Less than 8 weeks gestation of pregnancy: Secondary | ICD-10-CM | POA: Diagnosis not present

## 2024-04-02 DIAGNOSIS — Z3A00 Weeks of gestation of pregnancy not specified: Secondary | ICD-10-CM | POA: Diagnosis not present

## 2024-04-02 LAB — COMPREHENSIVE METABOLIC PANEL WITH GFR
ALT: 158 U/L — ABNORMAL HIGH (ref 0–44)
AST: 129 U/L — ABNORMAL HIGH (ref 15–41)
Albumin: 4.3 g/dL (ref 3.5–5.0)
Alkaline Phosphatase: 111 U/L (ref 38–126)
Anion gap: 10 (ref 5–15)
BUN: 11 mg/dL (ref 6–20)
CO2: 24 mmol/L (ref 22–32)
Calcium: 9.5 mg/dL (ref 8.9–10.3)
Chloride: 102 mmol/L (ref 98–111)
Creatinine, Ser: 0.62 mg/dL (ref 0.44–1.00)
GFR, Estimated: >60 mL/min (ref >60)
Glucose, Bld: 115 mg/dL — ABNORMAL HIGH (ref 70–99)
Potassium: 3.7 mmol/L (ref 3.5–5.1)
Sodium: 136 mmol/L (ref 135–145)
Total Bilirubin: 0.3 mg/dL (ref 0.0–1.2)
Total Protein: 7.2 g/dL (ref 6.5–8.1)

## 2024-04-02 LAB — CBC
HCT: 38.8 % (ref 36.0–46.0)
Hemoglobin: 13.3 g/dL (ref 12.0–15.0)
MCH: 28.4 pg (ref 26.0–34.0)
MCHC: 34.3 g/dL (ref 30.0–36.0)
MCV: 82.9 fL (ref 80.0–100.0)
Platelets: 296 10*3/uL (ref 150–400)
RBC: 4.68 MIL/uL (ref 3.87–5.11)
RDW: 13.2 % (ref 11.5–15.5)
WBC: 13.2 10*3/uL — ABNORMAL HIGH (ref 4.0–10.5)
nRBC: 0 % (ref 0.0–0.2)

## 2024-04-02 LAB — URINALYSIS, ROUTINE W REFLEX MICROSCOPIC
Bilirubin Urine: NEGATIVE
Glucose, UA: NEGATIVE mg/dL
Hgb urine dipstick: NEGATIVE
Ketones, ur: NEGATIVE mg/dL
Leukocytes,Ua: NEGATIVE
Nitrite: NEGATIVE
Specific Gravity, Urine: 1.025 (ref 1.005–1.030)
pH: 6.5 (ref 5.0–8.0)

## 2024-04-02 LAB — LIPASE, BLOOD: Lipase: 14 U/L (ref 11–51)

## 2024-04-02 LAB — HCG, QUANTITATIVE, PREGNANCY: hCG, Beta Chain, Quant, S: 15539 m[IU]/mL — ABNORMAL HIGH (ref <5)

## 2024-04-02 MED ORDER — ACETAMINOPHEN 500 MG PO TABS
1000.0000 mg | ORAL_TABLET | Freq: Once | ORAL | Status: AC
  Administered 2024-04-02: 1000 mg via ORAL
  Filled 2024-04-02: qty 2

## 2024-04-02 NOTE — ED Triage Notes (Signed)
 Pregnant. Cramping today- has worsened over day. Nauseated in triage. No bleeding. Unknown gestational age- first appointment Friday. First pregnancy. HX fatty liver. Omeprazole and zoloft daily.

## 2024-04-02 NOTE — Progress Notes (Signed)
 Pt paged on call provider with abdominal pain in early pregancy. LMP unsure, early February 2025. Was taking COC at the time.  First pregnancy, so worried. No bleeding, nausea or emesis. Pain rated an 8-9/10. First OB appt, [facility] on friday. No [place] yet.   Recommend that she proceed to ED for evaluation.

## 2024-04-03 NOTE — ED Provider Notes (Signed)
 [facility] Emergency Department Provider Note MRN:  [id number]  Arrival date & time: 04/03/24     Chief Complaint   Pelvic pain History of Present Illness   Leslie [name] is a 23 y.o. year-old female with no pertinent past medical history presenting to the ED with chief complaint of pelvic pain.  Patient explains that she is about [redacted] weeks pregnant, has follow-up with OB/GYN in 2 days.  Has had some occasional lower pelvic cramping during this pregnancy but nothing severe and usually very brief.  A few hours ago began experiencing increased severity of lower pelvic cramping pain not going away, constant.  Denies fever, no bleeding, no leakage of fluid.  Review of Systems  A thorough review of systems was obtained and all systems are negative except as noted in the HPI and PMH.   Patient's Health History    Past Medical History:  Diagnosis Date   ADHD (attention deficit hyperactivity disorder)    Allergy    Angio-edema    Anxiety    Eczema    Hepatic steatosis    Recurrent upper respiratory infection (URI)    Urticaria     Past Surgical History:  Procedure Laterality Date   ADENOIDECTOMY     LIVER BIOPSY  04/2023   MYRINGOTOMY WITH TUBE PLACEMENT     TONSILLECTOMY     tubes     TYMPANOSTOMY TUBE PLACEMENT     UPPER GASTROINTESTINAL ENDOSCOPY     wisdom teeth      Family History  Adopted: Yes    Social History   Socioeconomic History   Marital status: Single    Spouse name: Not on file   Number of children: 0   Years of education: 12   Highest education level: Not on file  Occupational History   Occupation: Consulting civil engineer  Tobacco Use   Smoking status: Never   Smokeless tobacco: Never  Vaping Use   Vaping status: Never Used  Substance and Sexual Activity   Alcohol use: Not Currently   Drug use: No   Sexual activity: Yes    Partners: Male    Birth control/protection: OCP    Comment: intercourse age 26, less than sexual partnes   Other Topics Concern   Not on file  Social History Narrative   Lives at home with parents.   Right-handed.   2 sodas per day.       Social Drivers of Corporate investment banker Strain: Not on file  Food Insecurity: Not on file  Transportation Needs: Not on file  Physical Activity: Not on file  Stress: Not on file  Social Connections: Not on file  Intimate Partner Violence: Not on file     Physical Exam   Vitals:   04/03/24 0145 04/03/24 0200  BP: 100/70 108/68  Pulse: 89 92  Resp:    Temp:    SpO2: 99% 99%    CONSTITUTIONAL: Well-appearing, NAD NEURO/PSYCH:  Alert and oriented x 3, no focal deficits EYES:  eyes equal and reactive ENT/NECK:  no LAD, no JVD CARDIO: Regular rate, well-perfused, normal S1 and S2 PULM:  CTAB no wheezing or rhonchi GI/GU:  non-distended, non-tender MSK/SPINE:  No gross deformities, no edema SKIN:  no rash, atraumatic   *Additional and/or pertinent findings included in MDM below  Diagnostic and Interventional Summary    EKG Interpretation Date/Time:    Ventricular Rate:    PR Interval:    QRS Duration:    QT Interval:  QTC Calculation:   R Axis:      Text Interpretation:         Labs Reviewed  COMPREHENSIVE METABOLIC PANEL WITH GFR - Abnormal; Notable for the following components:      Result Value   Glucose, Bld 115 (*)    AST 129 (*)    ALT 158 (*)    All other components within normal limits  CBC - Abnormal; Notable for the following components:   WBC 13.2 (*)    All other components within normal limits  URINALYSIS, ROUTINE W REFLEX MICROSCOPIC - Abnormal; Notable for the following components:   Protein, ur TRACE (*)    All other components within normal limits  HCG, QUANTITATIVE, PREGNANCY - Abnormal; Notable for the following components:   hCG, Beta Chain, Quant, S 15,539 (*)    All other components within normal limits  LIPASE, BLOOD    US OB LESS THAN 14 WEEKS WITH OB TRANSVAGINAL  Final Result       Medications  acetaminophen (TYLENOL) tablet 1,000 mg (1,000 mg Oral Given 04/02/24 2357)     Procedures  /  Critical Care Procedures  ED Course and Medical Decision Making  Initial Impression and Ddx Differential diagnosis includes pelvic pain related to the changes of pregnancy, miscarriage, ectopic pregnancy, UTI.  No vaginal discharge to suggest BV or STI.  Well-appearing in no acute distress with normal vital signs soft nontender abdomen awaiting ultrasound.  Past medical/surgical history that increases complexity of ED encounter: None  Interpretation of Diagnostics I personally reviewed the Laboratory Testing and my interpretation is as follows: No significant blood count or electrolyte disturbance.  hCG elevated  Ultrasound with probable early IUP but possibly too early to identify yolk sac, fetal pole, cardiac activity.  Patient Reassessment and Ultimate Disposition/Management     Patient feeling a lot better after Tylenol, will follow-up with OB/GYN.  Made aware of the ultrasound results.  Patient management required discussion with the following services or consulting groups:  None  Complexity of Problems Addressed Acute illness or injury that poses threat of life of bodily function  Additional Data Reviewed and Analyzed Further history obtained from: Further history from spouse/family member  Additional Factors Impacting ED Encounter Risk None  Elmer Sow. Pilar Plate, MD [facility] Emergency Medicine [facility] [email or web address]  Final Clinical Impressions(s) / ED Diagnoses     ICD-10-CM   1. Pelvic pain affecting pregnancy in first trimester, antepartum  O26.891    R10.2       ED Discharge Orders     None        Discharge Instructions Discussed with and Provided to Patient:     Discharge Instructions      You were evaluated in the Emergency Department and after careful evaluation, we did not find any emergent condition requiring  admission or further testing in the hospital.  Your exam/testing today was overall reassuring.  The ultrasound was overall reassuring, recommending repeat ultrasound within the next few weeks.  Keep your follow-up with OB/GYN in 2 days, continue Tylenol as needed for pain.  Please return to the Emergency Department if you experience any worsening of your condition.  Thank you for allowing [place] to be a part of your care.        Sabas Sous, MD 04/03/24 [phone]

## 2024-04-03 NOTE — Discharge Instructions (Signed)
 You were evaluated in the Emergency Department and after careful evaluation, we did not find any emergent condition requiring admission or further testing in the hospital.  Your exam/testing today was overall reassuring.  The ultrasound was overall reassuring, recommending repeat ultrasound within the next few weeks.  Keep your follow-up with OB/GYN in 2 days, continue Tylenol as needed for pain.  Please return to the Emergency Department if you experience any worsening of your condition.  Thank you for allowing [place] to be a part of your care.

## 2024-04-05 DIAGNOSIS — Z113 Encounter for screening for infections with a predominantly sexual mode of transmission: Secondary | ICD-10-CM | POA: Diagnosis not present

## 2024-04-05 DIAGNOSIS — N925 Other specified irregular menstruation: Secondary | ICD-10-CM | POA: Diagnosis not present

## 2024-04-05 DIAGNOSIS — Z3201 Encounter for pregnancy test, result positive: Secondary | ICD-10-CM | POA: Diagnosis not present

## 2024-04-05 DIAGNOSIS — Z3480 Encounter for supervision of other normal pregnancy, unspecified trimester: Secondary | ICD-10-CM | POA: Diagnosis not present

## 2024-04-23 ENCOUNTER — Ambulatory Visit: Payer: Commercial Managed Care - PPO | Admitting: Internal Medicine

## 2024-04-25 DIAGNOSIS — O3680X1 Pregnancy with inconclusive fetal viability, fetus 1: Secondary | ICD-10-CM | POA: Diagnosis not present

## 2024-04-25 DIAGNOSIS — Z3480 Encounter for supervision of other normal pregnancy, unspecified trimester: Secondary | ICD-10-CM | POA: Diagnosis not present

## 2024-04-25 DIAGNOSIS — Z3A08 8 weeks gestation of pregnancy: Secondary | ICD-10-CM | POA: Diagnosis not present

## 2024-04-25 DIAGNOSIS — Z3481 Encounter for supervision of other normal pregnancy, first trimester: Secondary | ICD-10-CM | POA: Diagnosis not present

## 2024-04-25 LAB — OB RESULTS CONSOLE RUBELLA ANTIBODY, IGM: Rubella: IMMUNE

## 2024-04-25 LAB — OB RESULTS CONSOLE GC/CHLAMYDIA
Chlamydia: NEGATIVE
Neisseria Gonorrhea: NEGATIVE

## 2024-04-25 LAB — OB RESULTS CONSOLE HEPATITIS B SURFACE ANTIGEN: Hepatitis B Surface Ag: NEGATIVE

## 2024-04-25 LAB — OB RESULTS CONSOLE RPR: RPR: NONREACTIVE

## 2024-04-25 LAB — HEPATITIS C ANTIBODY: HCV Ab: NEGATIVE

## 2024-04-25 LAB — OB RESULTS CONSOLE HIV ANTIBODY (ROUTINE TESTING): HIV: NONREACTIVE

## 2024-05-01 ENCOUNTER — Encounter: Payer: Self-pay | Admitting: Internal Medicine

## 2024-05-23 DIAGNOSIS — R7309 Other abnormal glucose: Secondary | ICD-10-CM | POA: Diagnosis not present

## 2024-05-23 DIAGNOSIS — Z3401 Encounter for supervision of normal first pregnancy, first trimester: Secondary | ICD-10-CM | POA: Diagnosis not present

## 2024-05-23 DIAGNOSIS — Z369 Encounter for antenatal screening, unspecified: Secondary | ICD-10-CM | POA: Diagnosis not present

## 2024-05-24 ENCOUNTER — Other Ambulatory Visit: Payer: Self-pay | Admitting: Internal Medicine

## 2024-05-27 ENCOUNTER — Other Ambulatory Visit: Payer: Self-pay

## 2024-05-27 ENCOUNTER — Other Ambulatory Visit (HOSPITAL_COMMUNITY): Payer: Self-pay

## 2024-05-27 MED ORDER — OMEPRAZOLE 40 MG PO CPDR
40.0000 mg | DELAYED_RELEASE_CAPSULE | Freq: Two times a day (BID) | ORAL | 2 refills | Status: DC
  Filled 2024-05-27 – 2024-07-15 (×2): qty 60, 30d supply, fill #0
  Filled 2024-08-25: qty 60, 30d supply, fill #1

## 2024-06-03 ENCOUNTER — Other Ambulatory Visit (HOSPITAL_COMMUNITY): Payer: Self-pay

## 2024-06-03 ENCOUNTER — Other Ambulatory Visit (HOSPITAL_BASED_OUTPATIENT_CLINIC_OR_DEPARTMENT_OTHER): Payer: Self-pay

## 2024-06-21 DIAGNOSIS — K700 Alcoholic fatty liver: Secondary | ICD-10-CM | POA: Diagnosis not present

## 2024-06-21 DIAGNOSIS — Z369 Encounter for antenatal screening, unspecified: Secondary | ICD-10-CM | POA: Diagnosis not present

## 2024-06-21 DIAGNOSIS — Z3482 Encounter for supervision of other normal pregnancy, second trimester: Secondary | ICD-10-CM | POA: Diagnosis not present

## 2024-07-12 DIAGNOSIS — Z369 Encounter for antenatal screening, unspecified: Secondary | ICD-10-CM | POA: Diagnosis not present

## 2024-07-12 DIAGNOSIS — Z363 Encounter for antenatal screening for malformations: Secondary | ICD-10-CM | POA: Diagnosis not present

## 2024-07-12 DIAGNOSIS — Z3A20 20 weeks gestation of pregnancy: Secondary | ICD-10-CM | POA: Diagnosis not present

## 2024-07-18 ENCOUNTER — Other Ambulatory Visit (HOSPITAL_BASED_OUTPATIENT_CLINIC_OR_DEPARTMENT_OTHER): Payer: Self-pay

## 2024-07-31 ENCOUNTER — Ambulatory Visit: Payer: Commercial Managed Care - PPO | Admitting: Allergy

## 2024-07-31 DIAGNOSIS — J309 Allergic rhinitis, unspecified: Secondary | ICD-10-CM

## 2024-08-06 DIAGNOSIS — Z369 Encounter for antenatal screening, unspecified: Secondary | ICD-10-CM | POA: Diagnosis not present

## 2024-08-29 ENCOUNTER — Other Ambulatory Visit (HOSPITAL_BASED_OUTPATIENT_CLINIC_OR_DEPARTMENT_OTHER): Payer: Self-pay

## 2024-08-31 ENCOUNTER — Other Ambulatory Visit (HOSPITAL_BASED_OUTPATIENT_CLINIC_OR_DEPARTMENT_OTHER): Payer: Self-pay

## 2024-09-02 ENCOUNTER — Other Ambulatory Visit: Payer: Self-pay

## 2024-09-02 ENCOUNTER — Other Ambulatory Visit (HOSPITAL_BASED_OUTPATIENT_CLINIC_OR_DEPARTMENT_OTHER): Payer: Self-pay

## 2024-09-02 MED ORDER — SERTRALINE HCL 100 MG PO TABS
100.0000 mg | ORAL_TABLET | Freq: Every day | ORAL | 1 refills | Status: DC
  Filled 2024-09-02: qty 90, 90d supply, fill #0
  Filled 2024-12-03: qty 90, 90d supply, fill #1

## 2024-09-06 ENCOUNTER — Other Ambulatory Visit (HOSPITAL_BASED_OUTPATIENT_CLINIC_OR_DEPARTMENT_OTHER): Payer: Self-pay

## 2024-09-06 DIAGNOSIS — Z3480 Encounter for supervision of other normal pregnancy, unspecified trimester: Secondary | ICD-10-CM | POA: Diagnosis not present

## 2024-09-06 DIAGNOSIS — Z23 Encounter for immunization: Secondary | ICD-10-CM | POA: Diagnosis not present

## 2024-09-06 MED ORDER — COMIRNATY 30 MCG/0.3ML IM SUSY
0.3000 mL | PREFILLED_SYRINGE | Freq: Once | INTRAMUSCULAR | 0 refills | Status: AC
  Filled 2024-09-07: qty 0.3, 1d supply, fill #0

## 2024-09-07 ENCOUNTER — Other Ambulatory Visit (HOSPITAL_BASED_OUTPATIENT_CLINIC_OR_DEPARTMENT_OTHER): Payer: Self-pay

## 2024-09-07 MED ORDER — FLUZONE 0.5 ML IM SUSY
0.5000 mL | PREFILLED_SYRINGE | Freq: Once | INTRAMUSCULAR | 0 refills | Status: AC
  Filled 2024-09-07: qty 0.5, 1d supply, fill #0

## 2024-09-20 DIAGNOSIS — O3663X1 Maternal care for excessive fetal growth, third trimester, fetus 1: Secondary | ICD-10-CM | POA: Diagnosis not present

## 2024-09-20 DIAGNOSIS — K700 Alcoholic fatty liver: Secondary | ICD-10-CM | POA: Diagnosis not present

## 2024-09-20 DIAGNOSIS — Z3A30 30 weeks gestation of pregnancy: Secondary | ICD-10-CM | POA: Diagnosis not present

## 2024-10-02 ENCOUNTER — Other Ambulatory Visit (HOSPITAL_BASED_OUTPATIENT_CLINIC_OR_DEPARTMENT_OTHER): Payer: Self-pay

## 2024-10-02 DIAGNOSIS — Z2911 Encounter for prophylactic immunotherapy for respiratory syncytial virus (RSV): Secondary | ICD-10-CM | POA: Diagnosis not present

## 2024-10-02 DIAGNOSIS — Z3490 Encounter for supervision of normal pregnancy, unspecified, unspecified trimester: Secondary | ICD-10-CM | POA: Diagnosis not present

## 2024-10-02 DIAGNOSIS — Z3A31 31 weeks gestation of pregnancy: Secondary | ICD-10-CM | POA: Diagnosis not present

## 2024-10-02 DIAGNOSIS — Z369 Encounter for antenatal screening, unspecified: Secondary | ICD-10-CM | POA: Diagnosis not present

## 2024-10-02 MED ORDER — ABRYSVO 120 MCG/0.5ML IM SOLR
INTRAMUSCULAR | 0 refills | Status: DC
  Filled 2024-10-07: qty 1, 1d supply, fill #0

## 2024-10-06 ENCOUNTER — Encounter: Payer: Self-pay | Admitting: Internal Medicine

## 2024-10-07 ENCOUNTER — Other Ambulatory Visit (HOSPITAL_BASED_OUTPATIENT_CLINIC_OR_DEPARTMENT_OTHER): Payer: Self-pay

## 2024-10-07 ENCOUNTER — Other Ambulatory Visit: Payer: Self-pay | Admitting: Internal Medicine

## 2024-10-07 DIAGNOSIS — K2970 Gastritis, unspecified, without bleeding: Secondary | ICD-10-CM

## 2024-10-07 MED ORDER — OMEPRAZOLE 40 MG PO CPDR
40.0000 mg | DELAYED_RELEASE_CAPSULE | Freq: Every day | ORAL | 0 refills | Status: DC
  Filled 2024-10-07: qty 30, 30d supply, fill #0

## 2024-10-18 DIAGNOSIS — Z369 Encounter for antenatal screening, unspecified: Secondary | ICD-10-CM | POA: Diagnosis not present

## 2024-11-06 DIAGNOSIS — Z113 Encounter for screening for infections with a predominantly sexual mode of transmission: Secondary | ICD-10-CM | POA: Diagnosis not present

## 2024-11-06 DIAGNOSIS — Z369 Encounter for antenatal screening, unspecified: Secondary | ICD-10-CM | POA: Diagnosis not present

## 2024-11-06 LAB — OB RESULTS CONSOLE GBS: GBS: NEGATIVE

## 2024-11-07 DIAGNOSIS — Z3480 Encounter for supervision of other normal pregnancy, unspecified trimester: Secondary | ICD-10-CM | POA: Diagnosis not present

## 2024-11-08 ENCOUNTER — Other Ambulatory Visit (HOSPITAL_COMMUNITY): Payer: Self-pay

## 2024-11-08 ENCOUNTER — Other Ambulatory Visit (HOSPITAL_BASED_OUTPATIENT_CLINIC_OR_DEPARTMENT_OTHER): Payer: Self-pay

## 2024-11-14 DIAGNOSIS — Z3A37 37 weeks gestation of pregnancy: Secondary | ICD-10-CM | POA: Diagnosis not present

## 2024-11-14 DIAGNOSIS — O09213 Supervision of pregnancy with history of pre-term labor, third trimester: Secondary | ICD-10-CM | POA: Diagnosis not present

## 2024-11-14 DIAGNOSIS — Z3483 Encounter for supervision of other normal pregnancy, third trimester: Secondary | ICD-10-CM | POA: Diagnosis not present

## 2024-11-14 DIAGNOSIS — Z3482 Encounter for supervision of other normal pregnancy, second trimester: Secondary | ICD-10-CM | POA: Diagnosis not present

## 2024-11-17 ENCOUNTER — Encounter: Payer: Self-pay | Admitting: Internal Medicine

## 2024-11-18 ENCOUNTER — Other Ambulatory Visit (HOSPITAL_BASED_OUTPATIENT_CLINIC_OR_DEPARTMENT_OTHER): Payer: Self-pay

## 2024-11-18 MED ORDER — OMEPRAZOLE 20 MG PO CPDR
DELAYED_RELEASE_CAPSULE | ORAL | 0 refills | Status: DC
  Filled 2024-11-18: qty 50, 52d supply, fill #0

## 2024-11-20 ENCOUNTER — Encounter (HOSPITAL_COMMUNITY): Payer: Self-pay

## 2024-11-20 ENCOUNTER — Inpatient Hospital Stay (HOSPITAL_COMMUNITY): Admitting: Anesthesiology

## 2024-11-20 ENCOUNTER — Other Ambulatory Visit: Payer: Self-pay

## 2024-11-20 ENCOUNTER — Inpatient Hospital Stay (HOSPITAL_COMMUNITY)
Admission: AD | Admit: 2024-11-20 | Discharge: 2024-11-24 | DRG: 786 | Disposition: A | Discharge status: Home / Self Care | Attending: Obstetrics and Gynecology | Admitting: Obstetrics and Gynecology

## 2024-11-20 DIAGNOSIS — Z3A38 38 weeks gestation of pregnancy: Secondary | ICD-10-CM | POA: Diagnosis not present

## 2024-11-20 DIAGNOSIS — E66813 Obesity, class 3: Secondary | ICD-10-CM | POA: Diagnosis not present

## 2024-11-20 DIAGNOSIS — Z3490 Encounter for supervision of normal pregnancy, unspecified, unspecified trimester: Principal | ICD-10-CM

## 2024-11-20 DIAGNOSIS — F32A Depression, unspecified: Secondary | ICD-10-CM | POA: Diagnosis not present

## 2024-11-20 DIAGNOSIS — K219 Gastro-esophageal reflux disease without esophagitis: Secondary | ICD-10-CM | POA: Diagnosis present

## 2024-11-20 DIAGNOSIS — O99214 Obesity complicating childbirth: Secondary | ICD-10-CM | POA: Diagnosis present

## 2024-11-20 DIAGNOSIS — N179 Acute kidney failure, unspecified: Secondary | ICD-10-CM | POA: Diagnosis not present

## 2024-11-20 DIAGNOSIS — D62 Acute posthemorrhagic anemia: Secondary | ICD-10-CM | POA: Diagnosis not present

## 2024-11-20 DIAGNOSIS — O99344 Other mental disorders complicating childbirth: Secondary | ICD-10-CM | POA: Diagnosis not present

## 2024-11-20 DIAGNOSIS — O9081 Anemia of the puerperium: Secondary | ICD-10-CM | POA: Diagnosis not present

## 2024-11-20 DIAGNOSIS — O4292 Full-term premature rupture of membranes, unspecified as to length of time between rupture and onset of labor: Principal | ICD-10-CM | POA: Diagnosis present

## 2024-11-20 DIAGNOSIS — O9962 Diseases of the digestive system complicating childbirth: Secondary | ICD-10-CM | POA: Diagnosis present

## 2024-11-20 DIAGNOSIS — O9049 Other postpartum acute kidney failure: Secondary | ICD-10-CM | POA: Diagnosis not present

## 2024-11-20 DIAGNOSIS — F419 Anxiety disorder, unspecified: Secondary | ICD-10-CM | POA: Diagnosis present

## 2024-11-20 DIAGNOSIS — O411230 Chorioamnionitis, third trimester, not applicable or unspecified: Secondary | ICD-10-CM | POA: Diagnosis not present

## 2024-11-20 DIAGNOSIS — Z3A00 Weeks of gestation of pregnancy not specified: Secondary | ICD-10-CM | POA: Diagnosis not present

## 2024-11-20 LAB — CBC
HCT: 33.4 % — ABNORMAL LOW (ref 36.0–46.0)
Hemoglobin: 10.6 g/dL — ABNORMAL LOW (ref 12.0–15.0)
MCH: 23.6 pg — ABNORMAL LOW (ref 26.0–34.0)
MCHC: 31.7 g/dL (ref 30.0–36.0)
MCV: 74.4 fL — ABNORMAL LOW (ref 80.0–100.0)
Platelets: 307 K/uL (ref 150–400)
RBC: 4.49 MIL/uL (ref 3.87–5.11)
RDW: 15 % (ref 11.5–15.5)
WBC: 13 K/uL — ABNORMAL HIGH (ref 4.0–10.5)
nRBC: 0 % (ref 0.0–0.2)

## 2024-11-20 LAB — SYPHILIS: RPR W/REFLEX TO RPR TITER AND TREPONEMAL ANTIBODIES, TRADITIONAL SCREENING AND DIAGNOSIS ALGORITHM: RPR Ser Ql: NONREACTIVE

## 2024-11-20 LAB — POCT FERN TEST: POCT Fern Test: POSITIVE

## 2024-11-20 MED ORDER — FLEET ENEMA RE ENEM: 1.0000 | ENEMA | RECTAL | Status: DC | PRN

## 2024-11-20 MED ORDER — ONDANSETRON HCL 4 MG/2ML IJ SOLN: 4.0000 mg | Freq: Four times a day (QID) | INTRAMUSCULAR | Status: DC | PRN

## 2024-11-20 MED ORDER — OXYCODONE-ACETAMINOPHEN 5-325 MG PO TABS: 1.0000 | ORAL_TABLET | ORAL | Status: DC | PRN

## 2024-11-20 MED ORDER — LACTATED RINGERS IV SOLN
500.0000 mL | INTRAVENOUS | Status: DC | PRN
  Administered 2024-11-21: 500 mL via INTRAVENOUS

## 2024-11-20 MED ORDER — LIDOCAINE HCL (PF) 1 % IJ SOLN: 30.0000 mL | INTRAMUSCULAR | Status: DC | PRN

## 2024-11-20 MED ORDER — OXYCODONE-ACETAMINOPHEN 5-325 MG PO TABS: 2.0000 | ORAL_TABLET | ORAL | Status: DC | PRN

## 2024-11-20 MED ORDER — SOD CITRATE-CITRIC ACID 500-334 MG/5ML PO SOLN: 30.0000 mL | ORAL | Status: DC | PRN

## 2024-11-20 MED ORDER — ACETAMINOPHEN 325 MG PO TABS: 650.0000 mg | ORAL_TABLET | ORAL | Status: DC | PRN

## 2024-11-20 MED ORDER — DIPHENHYDRAMINE HCL 50 MG/ML IJ SOLN: 12.5000 mg | INTRAMUSCULAR | Status: DC | PRN

## 2024-11-20 MED ORDER — LACTATED RINGERS IV SOLN: 500.0000 mL | Freq: Once | INTRAVENOUS | Status: DC

## 2024-11-20 MED ORDER — OXYTOCIN-SODIUM CHLORIDE 30-0.9 UT/500ML-% IV SOLN
1.0000 m[IU]/min | INTRAVENOUS | Status: DC
  Administered 2024-11-20: 2 m[IU]/min via INTRAVENOUS
  Filled 2024-11-20: qty 500

## 2024-11-20 MED ORDER — PHENYLEPHRINE 80 MCG/ML (10ML) SYRINGE FOR IV PUSH (FOR BLOOD PRESSURE SUPPORT): 80.0000 ug | PREFILLED_SYRINGE | INTRAVENOUS | Status: DC | PRN

## 2024-11-20 MED ORDER — LACTATED RINGERS IV SOLN: INTRAVENOUS | Status: DC

## 2024-11-20 MED ORDER — OXYTOCIN BOLUS FROM INFUSION: 333.0000 mL | Freq: Once | INTRAVENOUS | Status: DC

## 2024-11-20 MED ORDER — LACTATED RINGERS IV SOLN: 500.0000 mL | INTRAVENOUS | Status: DC | PRN

## 2024-11-20 MED ORDER — EPHEDRINE 5 MG/ML INJ
10.0000 mg | INTRAVENOUS | Status: DC | PRN
Start: 2024-11-20 — End: 2024-11-20

## 2024-11-20 MED ORDER — OXYTOCIN-SODIUM CHLORIDE 30-0.9 UT/500ML-% IV SOLN: 1.0000 m[IU]/min | INTRAVENOUS | Status: DC

## 2024-11-20 MED ORDER — OXYTOCIN-SODIUM CHLORIDE 30-0.9 UT/500ML-% IV SOLN: 2.5000 [IU]/h | INTRAVENOUS | Status: DC

## 2024-11-20 MED ORDER — FENTANYL-BUPIVACAINE-NACL 0.5-0.125-0.9 MG/250ML-% EP SOLN
12.0000 mL/h | EPIDURAL | Status: DC | PRN
  Administered 2024-11-20: 10 mL/h via EPIDURAL
  Filled 2024-11-20: qty 250

## 2024-11-20 MED ORDER — ACETAMINOPHEN 325 MG PO TABS
ORAL_TABLET | ORAL | Status: AC
  Filled 2024-11-20: qty 2

## 2024-11-20 MED ORDER — LIDOCAINE HCL (PF) 1 % IJ SOLN
INTRAMUSCULAR | Status: DC | PRN
  Administered 2024-11-20 (×2): 4 mL via EPIDURAL

## 2024-11-20 MED ORDER — FENTANYL CITRATE (PF) 100 MCG/2ML IJ SOLN
100.0000 ug | Freq: Once | INTRAMUSCULAR | Status: AC
  Administered 2024-11-20: 100 ug via INTRAVENOUS
  Filled 2024-11-20: qty 2

## 2024-11-20 MED ORDER — TERBUTALINE SULFATE 1 MG/ML IJ SOLN: 0.2500 mg | Freq: Once | INTRAMUSCULAR | Status: DC | PRN

## 2024-11-20 MED ORDER — TERBUTALINE SULFATE 1 MG/ML IJ SOLN
0.2500 mg | Freq: Once | INTRAMUSCULAR | Status: DC | PRN
  Filled 2024-11-20: qty 1

## 2024-11-20 MED ORDER — ONDANSETRON HCL 4 MG/2ML IJ SOLN
4.0000 mg | Freq: Four times a day (QID) | INTRAMUSCULAR | Status: DC | PRN
  Administered 2024-11-21: 4 mg via INTRAVENOUS
  Filled 2024-11-20: qty 2

## 2024-11-20 MED ORDER — SOD CITRATE-CITRIC ACID 500-334 MG/5ML PO SOLN
30.0000 mL | ORAL | Status: DC | PRN
  Filled 2024-11-20: qty 30

## 2024-11-20 MED ORDER — ACETAMINOPHEN 325 MG PO TABS
650.0000 mg | ORAL_TABLET | ORAL | Status: DC | PRN
  Administered 2024-11-20: 650 mg via ORAL

## 2024-11-20 NOTE — Progress Notes (Signed)
 Leslie [name] is a 23 y.o. G1P0000 at [redacted]w[redacted]d  Objective: BP (!) 135/95   Pulse (!) 119   Temp 97.9 F (36.6 C) (Oral)   Resp 16   Ht 5' (1.524 m)   Wt 97.5 kg   LMP 01/27/2024 (Approximate) Comment: Exact date unknown  SpO2 99%   BMI 41.99 kg/m  I/O last 3 completed shifts: In: -  Out: 1400 [Urine:1400] No intake/output data recorded.  FHT:  FHR: 130 bpm, variability: moderate,  accelerations:  Present,  decelerations:  Present variable UC:   regular, every 1-3 minutes SVE:   Dilation: 5.5 Effacement (%): 70, 80 Station: -1 Exam by:: Leslie Meyers, RN  Labs: Lab Results  Component Value Date   WBC 13.0 (H) 11/20/2024   HGB 10.6 (L) 11/20/2024   HCT 33.4 (L) 11/20/2024   MCV 74.4 (L) 11/20/2024   PLT 307 11/20/2024    Assessment / Plan: 23 yo G1P0 @ 38.5, IOL PROM  IOL: Progressing slowly through latent phase, pitocin  on 20 Fetal Wellbeing:  Category II Pain Control:  Epidural I/D:  n/a Anticipated MOD:  NSVD  Leslie DELENA Sharps, DO 11/20/2024, 10:24 PM

## 2024-11-20 NOTE — Progress Notes (Signed)
 Leslie [name] is a 23 y.o. G1P0000 at [redacted]w[redacted]d   Objective: BP 116/75   Pulse 78   Temp (!) 97 F (36.1 C) (Axillary)   Resp 16   Ht 5' (1.524 m)   Wt 97.5 kg   LMP 01/27/2024 (Approximate) Comment: Exact date unknown  SpO2 100%   BMI 41.99 kg/m  No intake/output data recorded. Total I/O In: -  Out: 1400 [Urine:1400]  FHT:  FHR: 120 bpm, variability: moderate,  accelerations:  Present,  decelerations:  Absent UC:   regular, every 2 minutes SVE:   Dilation: 4 Effacement (%): 70 Station: -1 Exam by:: Tennile Styles,DO  Labs: Lab Results  Component Value Date   WBC 13.0 (H) 11/20/2024   HGB 10.6 (L) 11/20/2024   HCT 33.4 (L) 11/20/2024   MCV 74.4 (L) 11/20/2024   PLT 307 11/20/2024    Assessment / Plan: 23 yo G1P0 @ 38.5, IOL PROM  IOL: AROM'd forebag, thick meconium. Pitocin  on 12 Fetal Wellbeing:  Category I Pain Control:  Epidural I/D:  n/a Anticipated MOD:  NSVD  Leslie DELENA Sharps, DO 11/20/2024, 4:39 PM

## 2024-11-20 NOTE — H&P (Signed)
 Leslie [name] is a 23 y.o. female presenting for rupture of membranes.  Leakage of fluid beginning @ 2330, confirmed ROM in MAU with positive fern. Was 2 cm on admit.  Pregnancy is complicated by:  1) EIF: x3 noted on anatomy US . Per [facility] no f/u needed, but 3rd tri US  done 2) ADHD: stopped scheduled Vyvanse , now only taking when she has a test (she is in nursing school) 3) GERD/h/o stomach ulcer: on omeprazole  4) Anxiety/depression: Zoloft  100mg . d/c'd trazadone at beginning of pregnancy. Unisom for sleep now 5) AFLD: biopsy proven, was due to alcohol consumption, and she has quit for over a year. LFTs were trended in pregnancy  6) Obesity: BMI 39 @ work-up  + FM, bloody show, and LOF. Normal FM.  OB History     Gravida  1   Para  0   Term  0   Preterm  0   AB  0   Living  0      SAB  0   IAB  0   Ectopic  0   Multiple  0   Live Births  0          Past Medical History:  Diagnosis Date   ADHD (attention deficit hyperactivity disorder)    Allergy     Angio-edema    Anxiety    Eczema    Hepatic steatosis    Recurrent upper respiratory infection (URI)    Urticaria    Past Surgical History:  Procedure Laterality Date   ADENOIDECTOMY     LIVER BIOPSY  04/2023   MYRINGOTOMY WITH TUBE PLACEMENT     TONSILLECTOMY     tubes     TYMPANOSTOMY TUBE PLACEMENT     UPPER GASTROINTESTINAL ENDOSCOPY     wisdom teeth     Family History: family history is not on file. She was adopted. Social History:  reports that she has never smoked. She has never used smokeless tobacco. She reports that she does not currently use alcohol. She reports that she does not use drugs.     Maternal Diabetes: No Genetic Screening: Normal Maternal Ultrasounds/Referrals: Isolated EIF (echogenic intracardiac focus) Fetal Ultrasounds or other Referrals:  None Maternal Substance Abuse:  No, none currently Significant Maternal Medications:  vyvanse , omeprazole , zoloft , ASA,  unisom Significant Maternal Lab Results:  Group B Strep negative Number of Prenatal Visits:greater than 3 verified prenatal visits Maternal Vaccinations:RSV: Given during pregnancy >/=14 days ago, TDap, Flu, and Covid Other Comments:  None  Review of Systems  Constitutional:  Negative for chills and fever.  Respiratory:  Negative for shortness of breath.   Cardiovascular:  Negative for chest pain.  Gastrointestinal:  Positive for abdominal pain.  Genitourinary:  Positive for pelvic pain, vaginal bleeding and vaginal discharge.   History Dilation: 3 Effacement (%): 70 Station: -2 Exam by:: Latrisa Hellums,DO Blood pressure 110/73, pulse 84, temperature 97.7 F (36.5 C), temperature source Oral, resp. rate 18, height 5' (1.524 m), weight 97.5 kg, last menstrual period 01/27/2024. Exam Physical Exam Constitutional:      General: She is not in acute distress.    Appearance: She is obese.  HENT:     Head: Normocephalic and atraumatic.  Pulmonary:     Effort: Pulmonary effort is normal.  Musculoskeletal:        General: Normal range of motion.  Skin:    General: Skin is warm and dry.  Neurological:     Mental Status: She is alert.  Psychiatric:  Mood and Affect: Mood normal.        Behavior: Behavior normal.     Prenatal labs: ABO, Rh: --/--/O POS (11/26 0256) Antibody: NEG (11/26 0256) Rubella: Immune (05/01 0000) RPR: NON REACTIVE (11/26 0256) Non-reactive  HBsAg: Negative (05/01 0000)  HIV:   Non-reactive GBS: Negative/-- (11/12 0000)   Assessment/Plan: 23 yo G1P0 @ 38.5, IOL PROM - Minimal change since admit, start pitocin  - Requests epidural prior to    [employer] 11/20/2024, 8:25 AM

## 2024-11-20 NOTE — Anesthesia Preprocedure Evaluation (Signed)
 Anesthesia Evaluation  Patient identified by MRN, date of birth, ID band Patient awake    Reviewed: Allergy  & Precautions, NPO status , Patient's Chart, lab work & pertinent test results  History of Anesthesia Complications Negative for: history of anesthetic complications  Airway Mallampati: III  TM Distance: >3 FB Neck ROM: Full    Dental   Pulmonary neg pulmonary ROS   Pulmonary exam normal breath sounds clear to auscultation       Cardiovascular negative cardio ROS  Rhythm:Regular Rate:Normal     Neuro/Psych  PSYCHIATRIC DISORDERS (ADHD) Anxiety     negative neurological ROS     GI/Hepatic ,GERD  Medicated,,Hepatic steatosis   Endo/Other  neg diabetes  Class 3 obesity  Renal/GU negative Renal ROS     Musculoskeletal   Abdominal  (+) + obese  Peds  Hematology  (+) Blood dyscrasia, anemia Lab Results      Component                Value               Date                      WBC                      13.0 (H)            11/20/2024                HGB                      10.6 (L)            11/20/2024                HCT                      33.4 (L)            11/20/2024                MCV                      74.4 (L)            11/20/2024                PLT                      307                 11/20/2024              Anesthesia Other Findings   Reproductive/Obstetrics (+) Pregnancy                              Anesthesia Physical Anesthesia Plan  ASA: 3  Anesthesia Plan: Epidural   Post-op Pain Management:    Induction:   PONV Risk Score and Plan:   Airway Management Planned: Natural Airway  Additional Equipment:   Intra-op Plan:   Post-operative Plan:   Informed Consent: I have reviewed the patients History and Physical, chart, labs and discussed the procedure including the risks, benefits and alternatives for the proposed anesthesia with the patient or  authorized representative who has indicated his/her understanding and acceptance.       Plan Discussed with:  Anesthesiologist  Anesthesia Plan Comments: (I have discussed risks of neuraxial anesthesia including but not limited to infection, bleeding, nerve injury, back pain, headache, seizures, and failure of block. Patient denies bleeding disorders and is not currently anticoagulated. Labs have been reviewed. Risks and benefits discussed. All patient's questions answered.  )         Anesthesia Quick Evaluation

## 2024-11-20 NOTE — Progress Notes (Signed)
 Marvis W Walle is a 23 y.o. G1P0000 at [redacted]w[redacted]d  Objective: BP (!) 99/53   Pulse 80   Temp 97.6 F (36.4 C) (Oral)   Resp 16   Ht 5' (1.524 m)   Wt 97.5 kg   LMP 01/27/2024 (Approximate) Comment: Exact date unknown  SpO2 97%   BMI 41.99 kg/m  No intake/output data recorded. Total I/O In: -  Out: 600 [Urine:600]  FHT:  FHR: 125 bpm, variability: moderate,  accelerations:  Present,  decelerations:  Absent UC:   regular, every 2 minutes SVE:   Dilation: 4 Effacement (%): 70 Station: -2 Exam by:: [employer]  Labs: Lab Results  Component Value Date   WBC 13.0 (H) 11/20/2024   HGB 10.6 (L) 11/20/2024   HCT 33.4 (L) 11/20/2024   MCV 74.4 (L) 11/20/2024   PLT 307 11/20/2024    Assessment / Plan: 24 yo G1P0 @ 38.5, IOL PROM  IOL: Pitocin  on 10  Fetal Wellbeing:  Category I Pain Control:  Epidural Anticipated MOD:  NSVD  [name], DO 11/20/2024, 1:02 PM

## 2024-11-20 NOTE — MAU Note (Signed)
 MAU Labor Triage Note:  .Leslie [name] is a 23 y.o. at Unknown here in MAU reporting:  Contractions every: 5 minutes  Pain Score: 7  Pain Location: Abdomen  ROM: large gush of brown-tinged fluid around 2330, is still leaking now Vaginal Bleeding: bloody show Last SVE: 2 cm  Labor Pain Management Plan: Planning epidural  GBS: Negative  Fetal Movement: Reports positive FM FHT: Fetal Heart Rate Mode: External Baseline Rate (A): 145 bpm  Vitals:   11/20/24 0121  BP: 128/81  Resp: 17  Temp: 97.9 F (36.6 C)      Lab orders placed from triage: MAU Labor Eval OB Office: [facility]

## 2024-11-20 NOTE — Anesthesia Procedure Notes (Signed)
 Epidural Patient location during procedure: OB Start time: 11/20/2024 8:36 AM End time: 11/20/2024 8:41 AM  Staffing Anesthesiologist: Peggye Delon Brunswick, MD Performed: anesthesiologist   Preanesthetic Checklist Completed: patient identified, IV checked, risks and benefits discussed, monitors and equipment checked, pre-op evaluation and timeout performed  Epidural Patient position: sitting Prep: DuraPrep and site prepped and draped Patient monitoring: continuous pulse ox and blood pressure Approach: midline Location: L3-L4 Injection technique: LOR saline  Needle:  Needle type: Tuohy  Needle gauge: 17 G Needle length: 9 cm and 9 Needle insertion depth: 6 cm Catheter type: closed end flexible Catheter size: 19 Gauge Catheter at skin depth: 11 cm Test dose: negative  Assessment Events: blood not aspirated, no cerebrospinal fluid, injection not painful, no injection resistance, no paresthesia and negative IV test  Additional Notes The patient has requested an epidural for labor pain management. Risks and benefits including, but not limited to, infection, bleeding, local anesthetic toxicity, headache, hypotension, back pain, block failure, etc. were discussed with the patient. The patient expressed understanding and consented to the procedure. I confirmed that the patient has no bleeding disorders and is not taking blood thinners. I confirmed the patient's last platelet count with the nurse. A time-out was performed immediately prior to the procedure. Please see nursing documentation for vital signs. Sterile technique was used throughout the whole procedure. Once LOR achieved, the epidural catheter threaded easily without resistance. Aspiration of the catheter was negative for blood and CSF. The epidural was dosed slowly and an infusion was started.  1 attempt(s)Reason for block:procedure for pain

## 2024-11-21 ENCOUNTER — Encounter (HOSPITAL_COMMUNITY): Payer: Self-pay | Admitting: Student

## 2024-11-21 ENCOUNTER — Other Ambulatory Visit: Payer: Self-pay

## 2024-11-21 ENCOUNTER — Encounter (HOSPITAL_COMMUNITY)
Admission: AD | Disposition: A | Discharge status: Home / Self Care | Payer: Self-pay | Source: Home / Self Care | Attending: Obstetrics and Gynecology

## 2024-11-21 DIAGNOSIS — O99214 Obesity complicating childbirth: Secondary | ICD-10-CM | POA: Diagnosis not present

## 2024-11-21 DIAGNOSIS — Z3A38 38 weeks gestation of pregnancy: Secondary | ICD-10-CM

## 2024-11-21 DIAGNOSIS — O99344 Other mental disorders complicating childbirth: Secondary | ICD-10-CM | POA: Diagnosis not present

## 2024-11-21 LAB — COMPREHENSIVE METABOLIC PANEL WITH GFR
ALT: 11 U/L (ref 0–44)
AST: 23 U/L (ref 15–41)
Albumin: 1.6 g/dL — ABNORMAL LOW (ref 3.5–5.0)
Alkaline Phosphatase: 199 U/L — ABNORMAL HIGH (ref 38–126)
Anion gap: 11 (ref 5–15)
BUN: 12 mg/dL (ref 6–20)
CO2: 19 mmol/L — ABNORMAL LOW (ref 22–32)
Calcium: 7.7 mg/dL — ABNORMAL LOW (ref 8.9–10.3)
Chloride: 106 mmol/L (ref 98–111)
Creatinine, Ser: 1.39 mg/dL — ABNORMAL HIGH (ref 0.44–1.00)
GFR, Estimated: 54 mL/min — ABNORMAL LOW (ref >60)
Glucose, Bld: 99 mg/dL (ref 70–99)
Potassium: 4.1 mmol/L (ref 3.5–5.1)
Sodium: 136 mmol/L (ref 135–145)
Total Bilirubin: 0.5 mg/dL (ref 0.0–1.2)
Total Protein: 4.4 g/dL — ABNORMAL LOW (ref 6.5–8.1)

## 2024-11-21 LAB — CBC
HCT: 23.9 % — ABNORMAL LOW (ref 36.0–46.0)
Hemoglobin: 7.7 g/dL — ABNORMAL LOW (ref 12.0–15.0)
MCH: 24 pg — ABNORMAL LOW (ref 26.0–34.0)
MCHC: 32.2 g/dL (ref 30.0–36.0)
MCV: 74.5 fL — ABNORMAL LOW (ref 80.0–100.0)
Platelets: 247 K/uL (ref 150–400)
RBC: 3.21 MIL/uL — ABNORMAL LOW (ref 3.87–5.11)
RDW: 14.9 % (ref 11.5–15.5)
WBC: 24 K/uL — ABNORMAL HIGH (ref 4.0–10.5)
nRBC: 0 % (ref 0.0–0.2)

## 2024-11-21 SURGERY — Surgical Case
Anesthesia: Epidural | Site: Abdomen

## 2024-11-21 MED ORDER — SODIUM CHLORIDE 0.9 % IR SOLN
Status: DC | PRN
  Administered 2024-11-21: 1000 mL

## 2024-11-21 MED ORDER — SOD CITRATE-CITRIC ACID 500-334 MG/5ML PO SOLN
30.0000 mL | ORAL | Status: AC
  Administered 2024-11-21: 30 mL via ORAL

## 2024-11-21 MED ORDER — NALOXONE HCL 4 MG/10ML IJ SOLN: 1.0000 ug/kg/h | INTRAVENOUS | Status: DC | PRN

## 2024-11-21 MED ORDER — TRANEXAMIC ACID-NACL 1000-0.7 MG/100ML-% IV SOLN
1000.0000 mg | Freq: Once | INTRAVENOUS | Status: AC | PRN
  Administered 2024-11-21: 1000 mg via INTRAVENOUS

## 2024-11-21 MED ORDER — WITCH HAZEL-GLYCERIN EX PADS: 1.0000 | MEDICATED_PAD | CUTANEOUS | Status: DC | PRN

## 2024-11-21 MED ORDER — IBUPROFEN 600 MG PO TABS: 600.0000 mg | ORAL_TABLET | Freq: Four times a day (QID) | ORAL | Status: DC

## 2024-11-21 MED ORDER — GABAPENTIN 100 MG PO CAPS
100.0000 mg | ORAL_CAPSULE | Freq: Two times a day (BID) | ORAL | Status: DC
  Administered 2024-11-21 – 2024-11-22 (×2): 100 mg via ORAL
  Filled 2024-11-21 (×2): qty 1

## 2024-11-21 MED ORDER — STERILE WATER FOR IRRIGATION IR SOLN
Status: DC | PRN
  Administered 2024-11-21: 1000 mL

## 2024-11-21 MED ORDER — SODIUM CHLORIDE 0.9% FLUSH: 3.0000 mL | INTRAVENOUS | Status: DC | PRN

## 2024-11-21 MED ORDER — PRENATAL MULTIVITAMIN CH
1.0000 | ORAL_TABLET | Freq: Every day | ORAL | Status: DC
  Administered 2024-11-23 – 2024-11-24 (×2): 1 via ORAL
  Filled 2024-11-21 (×2): qty 1

## 2024-11-21 MED ORDER — ONDANSETRON HCL 4 MG/2ML IJ SOLN
4.0000 mg | Freq: Three times a day (TID) | INTRAMUSCULAR | Status: DC | PRN
  Administered 2024-11-23: 4 mg via INTRAVENOUS
  Filled 2024-11-21: qty 2

## 2024-11-21 MED ORDER — OXYTOCIN-SODIUM CHLORIDE 30-0.9 UT/500ML-% IV SOLN
2.5000 [IU]/h | INTRAVENOUS | Status: DC
  Administered 2024-11-21: 2.5 [IU]/h via INTRAVENOUS
  Filled 2024-11-21: qty 500

## 2024-11-21 MED ORDER — EPHEDRINE SULFATE-NACL 50-0.9 MG/10ML-% IV SOSY
PREFILLED_SYRINGE | INTRAVENOUS | Status: DC | PRN
  Administered 2024-11-21: 10 mg via INTRAVENOUS

## 2024-11-21 MED ORDER — SIMETHICONE 80 MG PO CHEW
80.0000 mg | CHEWABLE_TABLET | Freq: Three times a day (TID) | ORAL | Status: DC
  Administered 2024-11-21 – 2024-11-24 (×7): 80 mg via ORAL
  Filled 2024-11-21 (×7): qty 1

## 2024-11-21 MED ORDER — DIPHENHYDRAMINE HCL 25 MG PO CAPS
25.0000 mg | ORAL_CAPSULE | ORAL | Status: DC | PRN
  Filled 2024-11-21: qty 1

## 2024-11-21 MED ORDER — KETOROLAC TROMETHAMINE 30 MG/ML IJ SOLN: 30.0000 mg | Freq: Four times a day (QID) | INTRAMUSCULAR | Status: DC | PRN

## 2024-11-21 MED ORDER — FENTANYL CITRATE (PF) 100 MCG/2ML IJ SOLN
INTRAMUSCULAR | Status: AC
  Filled 2024-11-21: qty 2

## 2024-11-21 MED ORDER — COCONUT OIL OIL: 1.0000 | TOPICAL_OIL | Status: DC | PRN

## 2024-11-21 MED ORDER — FENTANYL-BUPIVACAINE-NACL 0.5-0.125-0.9 MG/250ML-% EP SOLN
EPIDURAL | Status: AC
  Filled 2024-11-21: qty 250

## 2024-11-21 MED ORDER — OXYTOCIN-SODIUM CHLORIDE 30-0.9 UT/500ML-% IV SOLN
INTRAVENOUS | Status: DC | PRN
Start: 2024-11-21 — End: 2024-11-21
  Administered 2024-11-21: 300 mL via INTRAVENOUS

## 2024-11-21 MED ORDER — SERTRALINE HCL 100 MG PO TABS
100.0000 mg | ORAL_TABLET | Freq: Every day | ORAL | Status: DC
  Administered 2024-11-21 – 2024-11-23 (×3): 100 mg via ORAL
  Filled 2024-11-21 (×3): qty 1

## 2024-11-21 MED ORDER — SODIUM CHLORIDE 0.9 % IV SOLN
500.0000 mg | INTRAVENOUS | Status: AC
  Administered 2024-11-21: 500 mg via INTRAVENOUS

## 2024-11-21 MED ORDER — KETOROLAC TROMETHAMINE 30 MG/ML IJ SOLN
30.0000 mg | Freq: Four times a day (QID) | INTRAMUSCULAR | Status: DC
  Administered 2024-11-21 – 2024-11-22 (×3): 30 mg via INTRAVENOUS
  Filled 2024-11-21 (×3): qty 1

## 2024-11-21 MED ORDER — DIPHENHYDRAMINE HCL 50 MG/ML IJ SOLN: 12.5000 mg | INTRAMUSCULAR | Status: DC | PRN

## 2024-11-21 MED ORDER — FENTANYL CITRATE (PF) 100 MCG/2ML IJ SOLN: 25.0000 ug | INTRAMUSCULAR | Status: DC | PRN

## 2024-11-21 MED ORDER — SENNOSIDES-DOCUSATE SODIUM 8.6-50 MG PO TABS
2.0000 | ORAL_TABLET | Freq: Every day | ORAL | Status: DC
  Administered 2024-11-22 – 2024-11-24 (×3): 2 via ORAL
  Filled 2024-11-21 (×3): qty 2

## 2024-11-21 MED ORDER — MEPERIDINE HCL 25 MG/ML IJ SOLN: 6.2500 mg | INTRAMUSCULAR | Status: DC | PRN

## 2024-11-21 MED ORDER — DEXAMETHASONE SOD PHOSPHATE PF 10 MG/ML IJ SOLN
INTRAMUSCULAR | Status: DC | PRN
Start: 2024-11-21 — End: 2024-11-21
  Administered 2024-11-21: 10 mg via INTRAVENOUS

## 2024-11-21 MED ORDER — KETOROLAC TROMETHAMINE 30 MG/ML IJ SOLN
30.0000 mg | Freq: Four times a day (QID) | INTRAMUSCULAR | Status: DC | PRN
  Administered 2024-11-21: 30 mg via INTRAVENOUS

## 2024-11-21 MED ORDER — OXYCODONE HCL 5 MG PO TABS
5.0000 mg | ORAL_TABLET | ORAL | Status: DC | PRN
  Administered 2024-11-22: 10 mg via ORAL
  Administered 2024-11-22 (×2): 5 mg via ORAL
  Administered 2024-11-23 – 2024-11-24 (×3): 10 mg via ORAL
  Filled 2024-11-21 (×5): qty 2
  Filled 2024-11-21 (×2): qty 1

## 2024-11-21 MED ORDER — ONDANSETRON HCL 4 MG/2ML IJ SOLN
INTRAMUSCULAR | Status: DC | PRN
  Administered 2024-11-21: 4 mg via INTRAVENOUS

## 2024-11-21 MED ORDER — PHENYLEPHRINE 80 MCG/ML (10ML) SYRINGE FOR IV PUSH (FOR BLOOD PRESSURE SUPPORT)
PREFILLED_SYRINGE | INTRAVENOUS | Status: DC | PRN
Start: 2024-11-21 — End: 2024-11-21
  Administered 2024-11-21 (×2): 80 ug via INTRAVENOUS
  Administered 2024-11-21 (×2): 160 ug via INTRAVENOUS
  Administered 2024-11-21 (×2): 80 ug via INTRAVENOUS

## 2024-11-21 MED ORDER — FENTANYL CITRATE (PF) 100 MCG/2ML IJ SOLN
INTRAMUSCULAR | Status: DC | PRN
  Administered 2024-11-21: 100 ug via EPIDURAL

## 2024-11-21 MED ORDER — KETOROLAC TROMETHAMINE 30 MG/ML IJ SOLN
INTRAMUSCULAR | Status: AC
  Filled 2024-11-21: qty 1

## 2024-11-21 MED ORDER — MORPHINE SULFATE (PF) 0.5 MG/ML IJ SOLN
INTRAMUSCULAR | Status: DC | PRN
  Administered 2024-11-21: 3 ug via INTRATHECAL

## 2024-11-21 MED ORDER — MENTHOL 3 MG MT LOZG: 1.0000 | LOZENGE | OROMUCOSAL | Status: DC | PRN

## 2024-11-21 MED ORDER — DIBUCAINE (PERIANAL) 1 % EX OINT: 1.0000 | TOPICAL_OINTMENT | CUTANEOUS | Status: DC | PRN

## 2024-11-21 MED ORDER — SIMETHICONE 80 MG PO CHEW: 80.0000 mg | CHEWABLE_TABLET | ORAL | Status: DC | PRN

## 2024-11-21 MED ORDER — ENOXAPARIN SODIUM 40 MG/0.4ML IJ SOSY
40.0000 mg | PREFILLED_SYRINGE | INTRAMUSCULAR | Status: DC
  Filled 2024-11-21: qty 0.4

## 2024-11-21 MED ORDER — ACETAMINOPHEN 500 MG PO TABS
1000.0000 mg | ORAL_TABLET | Freq: Four times a day (QID) | ORAL | Status: DC
  Administered 2024-11-21 – 2024-11-24 (×12): 1000 mg via ORAL
  Filled 2024-11-21 (×12): qty 2

## 2024-11-21 MED ORDER — DIPHENHYDRAMINE HCL 25 MG PO CAPS
25.0000 mg | ORAL_CAPSULE | Freq: Four times a day (QID) | ORAL | Status: DC | PRN
  Administered 2024-11-21 – 2024-11-22 (×3): 25 mg via ORAL
  Filled 2024-11-21 (×2): qty 1

## 2024-11-21 MED ORDER — NALOXONE HCL 0.4 MG/ML IJ SOLN: 0.4000 mg | INTRAMUSCULAR | Status: DC | PRN

## 2024-11-21 MED ORDER — ONDANSETRON HCL 4 MG/2ML IJ SOLN
INTRAMUSCULAR | Status: AC
  Filled 2024-11-21: qty 2

## 2024-11-21 MED ORDER — SCOPOLAMINE 1 MG/3DAYS TD PT72: 1.0000 | MEDICATED_PATCH | Freq: Once | TRANSDERMAL | Status: DC

## 2024-11-21 MED ORDER — SERTRALINE HCL 100 MG PO TABS: 100.0000 mg | ORAL_TABLET | Freq: Every day | ORAL | Status: DC

## 2024-11-21 MED ORDER — ACETAMINOPHEN 10 MG/ML IV SOLN: 1000.0000 mg | Freq: Once | INTRAVENOUS | Status: DC | PRN

## 2024-11-21 MED ORDER — FENTANYL CITRATE (PF) 100 MCG/2ML IJ SOLN
INTRAMUSCULAR | Status: DC | PRN
  Administered 2024-11-21 (×2): 50 ug via INTRAVENOUS

## 2024-11-21 MED ORDER — CEFAZOLIN SODIUM-DEXTROSE 2-4 GM/100ML-% IV SOLN
2.0000 g | INTRAVENOUS | Status: AC
  Administered 2024-11-21: 2 g via INTRAVENOUS

## 2024-11-21 MED ORDER — ZOLPIDEM TARTRATE 5 MG PO TABS: 5.0000 mg | ORAL_TABLET | Freq: Every evening | ORAL | Status: DC | PRN

## 2024-11-21 MED ORDER — LIDOCAINE-EPINEPHRINE (PF) 2 %-1:200000 IJ SOLN
INTRAMUSCULAR | Status: DC | PRN
  Administered 2024-11-21: 2 mL via EPIDURAL
  Administered 2024-11-21: 5 mL via EPIDURAL
  Administered 2024-11-21: 3 mL via EPIDURAL

## 2024-11-21 MED ORDER — MORPHINE SULFATE (PF) 0.5 MG/ML IJ SOLN
INTRAMUSCULAR | Status: AC
  Filled 2024-11-21: qty 10

## 2024-11-21 MED ORDER — DEXMEDETOMIDINE HCL IN NACL 80 MCG/20ML IV SOLN
INTRAVENOUS | Status: DC | PRN
  Administered 2024-11-21: 8 ug via INTRAVENOUS
  Administered 2024-11-21: 4 ug via INTRAVENOUS

## 2024-11-21 MED ORDER — ACETAMINOPHEN 10 MG/ML IV SOLN
INTRAVENOUS | Status: DC | PRN
  Administered 2024-11-21: 1000 mg via INTRAVENOUS

## 2024-11-21 SURGICAL SUPPLY — 35 items
BENZOIN TINCTURE PRP APPL 2/3 (GAUZE/BANDAGES/DRESSINGS) ×2 IMPLANT
CHLORAPREP W/TINT 26 (MISCELLANEOUS) ×4 IMPLANT
CLAMP UMBILICAL CORD (MISCELLANEOUS) ×2 IMPLANT
CLOTH BEACON ORANGE TIMEOUT ST (SAFETY) ×2 IMPLANT
DERMABOND ADVANCED .7 DNX12 (GAUZE/BANDAGES/DRESSINGS) IMPLANT
DERMABOND ADVANCED .7 DNX6 (GAUZE/BANDAGES/DRESSINGS) IMPLANT
DRSG OPSITE POSTOP 4X10 (GAUZE/BANDAGES/DRESSINGS) ×2 IMPLANT
ELECTRODE REM PT RTRN 9FT ADLT (ELECTROSURGICAL) ×2 IMPLANT
EXTRACTOR VACUUM KIWI (MISCELLANEOUS) IMPLANT
GAUZE PAD ABD 7.5X8 STRL (GAUZE/BANDAGES/DRESSINGS) IMPLANT
GAUZE SPONGE 4X4 12PLY STRL LF (GAUZE/BANDAGES/DRESSINGS) IMPLANT
GLOVE BIOGEL M STER SZ 6 (GLOVE) ×2 IMPLANT
GLOVE BIOGEL PI IND STRL 6.5 (GLOVE) ×2 IMPLANT
GLOVE BIOGEL PI IND STRL 7.0 (GLOVE) ×2 IMPLANT
GOWN STRL REUS W/TWL LRG LVL3 (GOWN DISPOSABLE) ×4 IMPLANT
HEMOSTAT ARISTA ABSORB 3G PWDR (HEMOSTASIS) IMPLANT
KIT ABG SYR 3ML LUER SLIP (SYRINGE) IMPLANT
NDL HYPO 25X5/8 SAFETYGLIDE (NEEDLE) IMPLANT
NS IRRIG 1000ML POUR BTL (IV SOLUTION) ×2 IMPLANT
PACK C SECTION WH (CUSTOM PROCEDURE TRAY) ×2 IMPLANT
PAD OB MATERNITY 4.3X12.25 (PERSONAL CARE ITEMS) ×2 IMPLANT
RETRACTOR WND ALEXIS 25 LRG (MISCELLANEOUS) IMPLANT
RTRCTR C-SECT PINK 25CM LRG (MISCELLANEOUS) ×2 IMPLANT
STRIP CLOSURE SKIN 1/2X4 (GAUZE/BANDAGES/DRESSINGS) ×2 IMPLANT
SUT MNCRL 0 VIOLET CTX 36 (SUTURE) ×4 IMPLANT
SUT PDS AB 0 CTX 60 (SUTURE) IMPLANT
SUT PLAIN 0 NONE (SUTURE) IMPLANT
SUT PLAIN ABS 2-0 CT1 27XMFL (SUTURE) ×2 IMPLANT
SUT VIC AB 0 CTX36XBRD ANBCTRL (SUTURE) ×4 IMPLANT
SUT VIC AB 2-0 CT1 TAPERPNT 27 (SUTURE) ×2 IMPLANT
SUT VIC AB 4-0 KS 27 (SUTURE) ×2 IMPLANT
TAPE CLOTH SURG 4X10 WHT LF (GAUZE/BANDAGES/DRESSINGS) IMPLANT
TOWEL OR 17X24 6PK STRL BLUE (TOWEL DISPOSABLE) ×2 IMPLANT
TRAY FOLEY W/BAG SLVR 14FR LF (SET/KITS/TRAYS/PACK) ×2 IMPLANT
WATER STERILE IRR 1000ML POUR (IV SOLUTION) ×2 IMPLANT

## 2024-11-21 NOTE — Progress Notes (Signed)
 Leslie [name] is a 23 y.o. G1P0000 at [redacted]w[redacted]d  Patient is exhausted, has been pushing for approximately 3 hours.   Objective: BP 109/65   Pulse 77   Temp 99.2 F (37.3 C) (Axillary)   Resp 16   Ht 5' (1.524 m)   Wt 97.5 kg   LMP 01/27/2024 (Approximate) Comment: Exact date unknown  SpO2 100%   BMI 41.99 kg/m  I/O last 3 completed shifts: In: -  Out: 1400 [Urine:1400] No intake/output data recorded.  FHT:  FHR: 35 bpm, variability: moderate,  accelerations:  Present,  decelerations:  none UC:   q3-4 min SVE:   Complete/100%/+1 with pushes.  Labs: Lab Results  Component Value Date   WBC 13.0 (H) 11/20/2024   HGB 10.6 (L) 11/20/2024   HCT 33.4 (L) 11/20/2024   MCV 74.4 (L) 11/20/2024   PLT 307 11/20/2024    Assessment / Plan: 23 yo G1P0 @ 38.6, IOL PROM  IOL->pCS: 10cm since 4:30am, however labored down due to baby high in the pelvis. Pushing since approximately 7am with breaks for fetal wellbeing. Due to lack of appropriate descent with pushing, I recommended a C-section for arrest of descent and patient agrees. Pitocin  stopped and patient consented as below:  We thoroughly discussed the risks and benefits of of a C-section. Risks include bleeding (she would accept a blood transfusion in an emergency), damage to surrounding structures, and infection. We discussed that any of these complications could lead to the need for longer hospitalization, need for additional procedures/surgeries, or need for more medications. All questions were answered and patient endorsed understanding of risks. She would like to proceed with the surgery.   Prolonged rupture, afebrile. H OBH risk- plan TXA at start. Ancef  and azithro for surgical ppx. SCDs for VTE ppx.  Fetal Wellbeing:  Cat 1 Pain Control:  Epidural Anticipated MOD:  pCS  Rubie DELENA Husky, MD 11/21/2024, 10:33 AM

## 2024-11-21 NOTE — Progress Notes (Signed)
 Leslie [name] is a 23 y.o. G1P0000 at [redacted]w[redacted]d  Objective: BP 122/61   Pulse 79   Temp 97.9 F (36.6 C) (Oral)   Resp 16   Ht 5' (1.524 m)   Wt 97.5 kg   LMP 01/27/2024 (Approximate) Comment: Exact date unknown  SpO2 97%   BMI 41.99 kg/m  I/O last 3 completed shifts: In: -  Out: 1400 [Urine:1400] No intake/output data recorded.  FHT:  FHR: 130 bpm, variability: moderate,  accelerations:  Present,  decelerations:  Present variable x 1 UC:   regular, every 2 minutes SVE:   Dilation: 10 Effacement (%): 90 Station: Plus 1 Exam by:: Dr. Claudene  Labs: Lab Results  Component Value Date   WBC 13.0 (H) 11/20/2024   HGB 10.6 (L) 11/20/2024   HCT 33.4 (L) 11/20/2024   MCV 74.4 (L) 11/20/2024   PLT 307 11/20/2024    Assessment / Plan: 23 yo G1P0 @ 38.6, IOL PROM  IOL: Prolonged rupture, now 10 cm. However very high in the maternal pelvis with little descent while pushing. Labor down Fetal Wellbeing:  Category II Pain Control:  Epidural I/D:  n/a Anticipated MOD:  NSVD  Leslie DELENA Claudene, DO 11/21/2024, 5:12 AM

## 2024-11-21 NOTE — Transfer of Care (Signed)
 Immediate Anesthesia Transfer of Care Note  Patient: Leslie [name]  Procedure(s) Performed: CESAREAN DELIVERY (Abdomen)  Patient Location: PACU  Anesthesia Type:Epidural  Level of Consciousness: awake  Airway & Oxygen  Therapy: Patient Spontanous Breathing  Post-op Assessment: Report given to RN  Post vital signs: Reviewed and stable  Last Vitals:  Vitals Value Taken Time  BP 99/55 11/21/24 12:30  Temp    Pulse 108 11/21/24 12:31  Resp 16 11/21/24 12:31  SpO2 99 % 11/21/24 12:31  Vitals shown include unfiled device data.  Last Pain:  Vitals:   11/21/24 0935  TempSrc: Axillary  PainSc:          Complications: No notable events documented.

## 2024-11-21 NOTE — Op Note (Signed)
 CESAREAN SECTION Procedure Note  Patient: Leslie [name] DOB [birth year]  Preoperative Diagnosis: IUP at [redacted]w[redacted]d, prolonged rupture of membranes, arrest of descent, meconium stained fluid Postoperative Diagnosis: same, delivered  Procedure: primary low transverse C-section     Surgeon: Rubie Husky, MD  Assistant surgeon: Rollo Bring, MD  A skilled assistant was required for this case due to its complexity.  Anesthesia: Spinal anesthesia   Findings: Normal appearing uterus, fallopian tubes bilaterally, and ovaries bilaterally.  Viable female infant in vertex presentation delivered at 11:30am with weight 7lb 14.6oz; Apgars 8 and 9.  Estimated Blood Loss:  962cc         Specimens: Placenta to pathology         Complications:  None         Disposition: PACU - hemodynamically stable.         Condition: stable    Description of Procedure: The patient was taken to the operating room where epidural anesthesia was found to be adequate. The patient was placed in the dorsal supine position. Fetal heart tones were confirmed. SCD were applied and cycling. A hand was placed into the vagina and fetal head was disengaged from the pelvis. A foley catheter was previously inserted and draining. Ancef  2g and 500mg  azithromycin  were given for infection prophylaxis. The patient was subsequently prepped and draped in the normal sterile fashion.  A routine pre-operative time out was performed.  A low transverse skin incision was made with a scalpel and carried down to the level of the fascia with the Bovie.  The fascia was incised in the midline with the scalpel and extended laterally with curved Mayo scissors.  Kocher clamps were applied to the superior fascial edge and the fascia was dissected off the rectus muscle sharply using the Mayo scissors. The rectus muscles then were separated in the midline.  The peritoneum was found free of adherent bowel and the peritoneal cavity was entered bluntly.  The  uterus was identified and the alexis retractor was placed intraperitoneal.  A bladder flap was then created sharply with Metzenbaum scissors and separated from the lower uterine segment digitally.   A low transverse hysterotomy was then made with a scalpel. The infant was found in the vertex presentation was delivered atraumatically and without difficulty with standard maneuvers. No nuchal. Baby had good tone and cry and routine bulb suction of nares was performed. After 60 seconds of delayed cord clamping the cord was clamped and cut and the infant was handed off to the pediatricians. The placenta was delivered with gentle traction on umbilical cord and manual massage of the uterine fundus. The uterus was cleared of all clot and debris.  The hysterotomy was then closed with 0 monocryl in a running locked fashion. A hematoma was noted on the left corner of the hysterotomy but was noted to be stable after a few minutes of monitoring. The hysterotomy was found to be hemostatic after light Bovie use on serosal edges and Arista hemostatic agent placed over the hysterotomy. The Alexis retractor was removed. The fascia was closed with a 0 Vicryl suture in a continuous running fashion.  The subcutaneous tissue was irrigated and rendered hemostatic with cautery.  The subcutaneous layer was subsequently closed with 2-0 plain gut in an interrupted fashion.  The skin was closed with 4-0 vicryl  in a running subcuticular fashion.  Sponge, lap and needle counts were correct. Dermabond and a Honeycomb dressing were placed on the incision.  The patient and baby were  both doing well when I left the OR.  Rubie Husky, MD 11/21/24 12:13pm

## 2024-11-21 NOTE — Anesthesia Postprocedure Evaluation (Signed)
 Anesthesia Post Note  Patient: Leslie [name]  Procedure(s) Performed: CESAREAN DELIVERY (Abdomen)     Patient location during evaluation: PACU Anesthesia Type: Epidural Level of consciousness: oriented and awake and alert Pain management: pain level controlled Vital Signs Assessment: post-procedure vital signs reviewed and stable Respiratory status: spontaneous breathing, respiratory function stable and patient connected to nasal cannula oxygen  Cardiovascular status: blood pressure returned to baseline and stable Postop Assessment: no headache, no backache and no apparent nausea or vomiting Anesthetic complications: no   No notable events documented.  Last Vitals:  Vitals:   11/21/24 1315 11/21/24 1330  BP: 97/70 100/61  Pulse: 93 80  Resp: 17 20  Temp:    SpO2: 98% 97%    Last Pain:  Vitals:   11/21/24 1325  TempSrc:   PainSc: 4    Pain Goal:    LLE Motor Response: Purposeful movement (11/21/24 1315)   RLE Motor Response: Purposeful movement (11/21/24 1315)       Epidural/Spinal Function Cutaneous sensation: Able to Wiggle Toes (11/21/24 1315), Patient able to flex knees: Yes (11/21/24 1315), Patient able to lift hips off bed: No (11/21/24 1315), Back pain beyond tenderness at insertion site: No (11/21/24 1315), Progressively worsening motor and/or sensory loss: No (11/21/24 1315), Bowel and/or bladder incontinence post epidural: No (11/21/24 1315)  [name]

## 2024-11-21 NOTE — Progress Notes (Signed)
 Delane W Bordeaux is a 23 y.o. G1P0000 at [redacted]w[redacted]d   Objective: BP 119/70   Pulse 79   Temp 97.9 F (36.6 C) (Oral)   Resp 16   Ht 5' (1.524 m)   Wt 97.5 kg   LMP 01/27/2024 (Approximate) Comment: Exact date unknown  SpO2 99%   BMI 41.99 kg/m  I/O last 3 completed shifts: In: -  Out: 1400 [Urine:1400] No intake/output data recorded.  FHT:  FHR: 130 bpm, variability: moderate,  accelerations:  Abscent,  decelerations:  Present early and prolonged UC:   regular, every 2-3 minutes SVE:   Dilation: Lip/rim Effacement (%): 90 Station: Plus 1 Exam by:: Greig Meyers, RN  Labs: Lab Results  Component Value Date   WBC 13.0 (H) 11/20/2024   HGB 10.6 (L) 11/20/2024   HCT 33.4 (L) 11/20/2024   MCV 74.4 (L) 11/20/2024   PLT 307 11/20/2024    Assessment / Plan: 23 yo G1P0 @ 38.6  Labor: Pitocin  on 22, anterior lip Fetal Wellbeing:  Category II Pain Control:  Epidural Anticipated MOD:  NSVD  [name], DO 11/21/2024, 2:57 AM

## 2024-11-21 NOTE — Lactation Note (Signed)
 This note was copied from a baby's chart. Lactation Consultation Note  Patient Name: Leslie [name] Unijb'd Date: 11/21/2024 Age:25 hours Reason for consult: Initial assessment;Primapara;1st time breastfeeding;Early term 37-38.6wks  P1- MOB came into the hospital planning to offer both breast milk and formula, but decided to switch to formula only after having her csection. MOB feels like formula only would be what is best for her and infant. LC spent 30 minutes reviewing lactation suppression, engorgement/breast care, formula volume guidelines by age, how to safely prepare/store/feed formula and how to pace feed infant. LC encouraged MOB to call for further assistance as needed in hospital and after discharge.  Maternal Data Has patient been taught Hand Expression?: No Does the patient have breastfeeding experience prior to this delivery?: No  Feeding Mother's Current Feeding Choice: Formula Nipple Type: Slow - flow  Interventions Interventions: Pace feeding;Education  Discharge Discharge Education: Engorgement and breast care;Warning signs for feeding baby Pump: Declined  Consult Status Consult Status: Complete    Recardo Hoit BS, IBCLC 11/21/2024, 5:32 PM

## 2024-11-21 NOTE — Progress Notes (Addendum)
 Alaisa W Dillingham is a 23 y.o. G1P0000 at [redacted]w[redacted]d  Objective: BP 131/80   Pulse 80   Temp 98.7 F (37.1 C) (Axillary)   Resp 16   Ht 5' (1.524 m)   Wt 97.5 kg   LMP 01/27/2024 (Approximate) Comment: Exact date unknown  SpO2 98%   BMI 41.99 kg/m  I/O last 3 completed shifts: In: -  Out: 1400 [Urine:1400] No intake/output data recorded.  FHT:  FHR: 150 bpm, variability: moderate,  accelerations:  Present,  decelerations:  variables with pushing UC:   regular, every 2-3 minutes SVE:   Complete/100%/+1 with pushes.  Labs: Lab Results  Component Value Date   WBC 13.0 (H) 11/20/2024   HGB 10.6 (L) 11/20/2024   HCT 33.4 (L) 11/20/2024   MCV 74.4 (L) 11/20/2024   PLT 307 11/20/2024    Assessment / Plan: 23 yo G1P0 @ 38.6, IOL PROM  IOL: Prolonged rupture, afebrile. Plan TXA with delivery. 10cm since 4:30am, however labored down due to baby high in the pelvis. Pushing since approximately 7am with breaks.  Fetal Wellbeing:  Category II for variables with pushing.  Pain Control:  Epidural Anticipated MOD:  NSVD  Rubie DELENA Husky, MD 11/21/2024, 7:57 AM   Addendum: prolonged variable with pushing. Resolved with turning off pitocin  and position change. Moderate variability throughout. Will restart pitocin  at half now and restart pushing in 15 minutes allowing for fetal recovery. Fetus descending to +2 with pushes.

## 2024-11-22 LAB — COMPREHENSIVE METABOLIC PANEL WITH GFR
ALT: 12 U/L (ref 0–44)
AST: 19 U/L (ref 15–41)
Albumin: 1.8 g/dL — ABNORMAL LOW (ref 3.5–5.0)
Alkaline Phosphatase: 152 U/L — ABNORMAL HIGH (ref 38–126)
Anion gap: 8 (ref 5–15)
BUN: 15 mg/dL (ref 6–20)
CO2: 21 mmol/L — ABNORMAL LOW (ref 22–32)
Calcium: 8 mg/dL — ABNORMAL LOW (ref 8.9–10.3)
Chloride: 110 mmol/L (ref 98–111)
Creatinine, Ser: 1.04 mg/dL — ABNORMAL HIGH (ref 0.44–1.00)
GFR, Estimated: >60 mL/min (ref >60)
Glucose, Bld: 72 mg/dL (ref 70–99)
Potassium: 4.1 mmol/L (ref 3.5–5.1)
Sodium: 139 mmol/L (ref 135–145)
Total Bilirubin: <0.2 mg/dL (ref 0.0–1.2)
Total Protein: 4.8 g/dL — ABNORMAL LOW (ref 6.5–8.1)

## 2024-11-22 LAB — CBC
HCT: 18.6 % — ABNORMAL LOW (ref 36.0–46.0)
HCT: 25.4 % — ABNORMAL LOW (ref 36.0–46.0)
Hemoglobin: 6.1 g/dL — CL (ref 12.0–15.0)
Hemoglobin: 8.3 g/dL — ABNORMAL LOW (ref 12.0–15.0)
MCH: 24.3 pg — ABNORMAL LOW (ref 26.0–34.0)
MCH: 25.2 pg — ABNORMAL LOW (ref 26.0–34.0)
MCHC: 32.8 g/dL (ref 30.0–36.0)
MCHC: 32.7 g/dL (ref 30.0–36.0)
MCV: 74.1 fL — ABNORMAL LOW (ref 80.0–100.0)
MCV: 77.2 fL — ABNORMAL LOW (ref 80.0–100.0)
Platelets: 224 K/uL (ref 150–400)
Platelets: 226 K/uL (ref 150–400)
RBC: 2.51 MIL/uL — ABNORMAL LOW (ref 3.87–5.11)
RBC: 3.29 MIL/uL — ABNORMAL LOW (ref 3.87–5.11)
RDW: 15.3 % (ref 11.5–15.5)
RDW: 15.9 % — ABNORMAL HIGH (ref 11.5–15.5)
WBC: 18.4 K/uL — ABNORMAL HIGH (ref 4.0–10.5)
WBC: 19.3 K/uL — ABNORMAL HIGH (ref 4.0–10.5)
nRBC: 0 % (ref 0.0–0.2)
nRBC: 0 % (ref 0.0–0.2)

## 2024-11-22 LAB — PREPARE RBC (CROSSMATCH)

## 2024-11-22 LAB — ABO/RH: ABO/RH(D): O POS

## 2024-11-22 MED ORDER — LIDOCAINE-EPINEPHRINE (PF) 2 %-1:200000 IJ SOLN
INTRAMUSCULAR | Status: AC
  Filled 2024-11-22: qty 20

## 2024-11-22 MED ORDER — CYCLOBENZAPRINE HCL 10 MG PO TABS
5.0000 mg | ORAL_TABLET | Freq: Three times a day (TID) | ORAL | Status: DC | PRN
  Administered 2024-11-22 – 2024-11-24 (×3): 5 mg via ORAL
  Filled 2024-11-22 (×3): qty 1

## 2024-11-22 MED ORDER — LIDOCAINE HCL (PF) 1 % IJ SOLN
INTRAMUSCULAR | Status: AC
  Filled 2024-11-22: qty 5

## 2024-11-22 MED ORDER — IBUPROFEN 600 MG PO TABS: 600.0000 mg | ORAL_TABLET | Freq: Four times a day (QID) | ORAL | Status: DC

## 2024-11-22 MED ORDER — DEXMEDETOMIDINE HCL IN NACL 80 MCG/20ML IV SOLN
INTRAVENOUS | Status: AC
  Filled 2024-11-22: qty 20

## 2024-11-22 MED ORDER — FAMOTIDINE 20 MG PO TABS
20.0000 mg | ORAL_TABLET | Freq: Every day | ORAL | Status: DC
  Administered 2024-11-22 – 2024-11-24 (×3): 20 mg via ORAL
  Filled 2024-11-22 (×3): qty 1

## 2024-11-22 MED ORDER — SODIUM CHLORIDE 0.9% IV SOLUTION: Freq: Once | INTRAVENOUS | Status: AC

## 2024-11-22 MED ORDER — DIPHENHYDRAMINE HCL 50 MG/ML IJ SOLN
INTRAMUSCULAR | Status: AC
  Filled 2024-11-22: qty 1

## 2024-11-22 MED ORDER — GABAPENTIN 100 MG PO CAPS
200.0000 mg | ORAL_CAPSULE | Freq: Two times a day (BID) | ORAL | Status: DC
  Administered 2024-11-22 – 2024-11-24 (×4): 200 mg via ORAL
  Filled 2024-11-22 (×4): qty 2

## 2024-11-22 MED ORDER — DIPHENHYDRAMINE HCL 25 MG PO CAPS: 25.0000 mg | ORAL_CAPSULE | Freq: Once | ORAL | Status: DC

## 2024-11-22 NOTE — Progress Notes (Signed)
 MOB was referred for history of depression/anxiety.  * Referral screened out by Clinical Social Worker because none of the following criteria appear to apply:  ~ History of anxiety/depression during this pregnancy, or of post-partum depression following prior delivery.  ~ Diagnosis of anxiety and/or depression within last 3 years  OR   * MOB's symptoms currently being treated with medication and/or therapy.  Per OB notes, MOB has an active prescription for Zoloft 100mg  for support.  Edinburgh score 4  Please contact the Clinical Social Worker if needs arise, by [facility] request, or if MOB scores greater than 9/yes to question 10 on Edinburgh Postpartum Depression Screen.  Leslie [name], [place] Clinical Social Worker [phone]

## 2024-11-22 NOTE — Progress Notes (Addendum)
 Subjective: Postpartum Day 1: Cesarean Delivery Patient reports incisional pain, tolerating PO, and + flatus.  Has not yet voided since foley taken out two hours ago. Has ambulated twice. Lochia appropriate.  Objective: Vital signs in last 24 hours: Temp:  [97.6 F (36.4 C)-99.2 F (37.3 C)] 97.8 F (36.6 C) (11/28 0028) Pulse Rate:  [72-114] 85 (11/28 0028) Resp:  [14-21] 16 (11/28 0028) BP: (94-131)/(55-83) 107/55 (11/28 0028) SpO2:  [97 %-100 %] 98 % (11/28 0028)  Physical Exam:  General: alert, cooperative, fatigued, and no distress Lochia: appropriate Uterine Fundus: firm Incision: c/d/I/ with honeycomb dressing  DVT Evaluation: No evidence of DVT seen on physical exam.  Recent Labs    11/21/24 1248 11/22/24 0438  HGB 7.7* 6.1*  HCT 23.9* 18.6*    Assessment/Plan: Leslie [name] is a 23 y.o. G1 now P71 female POD1 s/p pCS at [redacted]w[redacted]d for arrest of descent.  -POD1: Doing well postoperatively.  -Acute blood loss anemia, clinically significant for this hospitalization/PPH: EBL 1128cc. Hgb 10.6-6.1. Plan two units PRBC. Patient consented.  -AKI: Cr 1.39 in setting of ABLA/PPH as above. Normal UOP since surgery. Hold Lovenox  and trend Cr. - ADHD: stopped scheduled Vyvanse , now only taking when she has a test (she is in nursing school) -GERD/h/o stomach ulcer: on omeprazole ; not on formulary, famotidine ordered.  -Anxiety/depression: Zoloft  100mg  ordred.  -AFLD: biopsy proven, was due to alcohol consumption, and she has quit for over a year. Normal admit LFTs -Obesity: hold Lovenox  in s/o AKI. SCDs for VTE ppx.   Dispo: Continue current care.  Leslie DELENA Husky, MD 11/22/2024, 5:24 AM   Addendum: Rounded on patient again. She was up ambulating, second unit of blood infusing. Pain well controlled. Voiding, passing gas, tolerating PO. Lochia minimal. Bottlefeeding.     11/22/2024   11:30 AM 11/22/2024   10:06 AM 11/22/2024    9:29 AM  Vitals with BMI  Systolic  95 90 97  Diastolic 61 55 64  Pulse 74 81 79   Physical Exam:  General: alert, cooperative, and no distress Lochia: appropriate Uterine Fundus: firm Incision: c/d/I/ with honeycomb dressing  DVT Evaluation: No evidence of DVT seen on physical exam.   We discussed her pain regimen. Holding toradol  for AKI, patient reports she can't take ibuprofen  due to h/o gastric ulcers, so I cancelled this order. Increased scheduled gabapentin  to 200mg  BID and added flexeril PRN.   Repeat CBC and CMP ordered for 4 hours after completion of second unit PRBC.   Desires circumcision; too early today, plan for tomorrow, orders in.

## 2024-11-23 ENCOUNTER — Encounter (HOSPITAL_COMMUNITY): Payer: Self-pay | Admitting: Obstetrics and Gynecology

## 2024-11-23 LAB — TYPE AND SCREEN
ABO/RH(D): O POS
Antibody Screen: NEGATIVE
Unit division: 0
Unit division: 0

## 2024-11-23 LAB — COMPREHENSIVE METABOLIC PANEL WITH GFR
ALT: 16 U/L (ref 0–44)
AST: 24 U/L (ref 15–41)
Albumin: 2 g/dL — ABNORMAL LOW (ref 3.5–5.0)
Alkaline Phosphatase: 164 U/L — ABNORMAL HIGH (ref 38–126)
Anion gap: 9 (ref 5–15)
BUN: 13 mg/dL (ref 6–20)
CO2: 21 mmol/L — ABNORMAL LOW (ref 22–32)
Calcium: 8.1 mg/dL — ABNORMAL LOW (ref 8.9–10.3)
Chloride: 110 mmol/L (ref 98–111)
Creatinine, Ser: 1.02 mg/dL — ABNORMAL HIGH (ref 0.44–1.00)
GFR, Estimated: >60 mL/min (ref >60)
Glucose, Bld: 86 mg/dL (ref 70–99)
Potassium: 4.2 mmol/L (ref 3.5–5.1)
Sodium: 140 mmol/L (ref 135–145)
Total Bilirubin: 0.4 mg/dL (ref 0.0–1.2)
Total Protein: 5.2 g/dL — ABNORMAL LOW (ref 6.5–8.1)

## 2024-11-23 LAB — CBC
HCT: 24.8 % — ABNORMAL LOW (ref 36.0–46.0)
Hemoglobin: 8.1 g/dL — ABNORMAL LOW (ref 12.0–15.0)
MCH: 25.2 pg — ABNORMAL LOW (ref 26.0–34.0)
MCHC: 32.7 g/dL (ref 30.0–36.0)
MCV: 77 fL — ABNORMAL LOW (ref 80.0–100.0)
Platelets: 268 K/uL (ref 150–400)
RBC: 3.22 MIL/uL — ABNORMAL LOW (ref 3.87–5.11)
RDW: 15.9 % — ABNORMAL HIGH (ref 11.5–15.5)
WBC: 14.9 K/uL — ABNORMAL HIGH (ref 4.0–10.5)
nRBC: 0 % (ref 0.0–0.2)

## 2024-11-23 LAB — BPAM RBC
Blood Product Expiration Date: 202512282359
Blood Product Expiration Date: 202512282359
ISSUE DATE / TIME: 202511280646
ISSUE DATE / TIME: 202511280945
Unit Type and Rh: 5100
Unit Type and Rh: 5100

## 2024-11-23 NOTE — Progress Notes (Signed)
 Subjective: Postpartum Day 2: Cesarean Delivery Patient reports pain controlled, no nausea or vomiting.  Ambulating.  Initially with standing patient feels like her legs are weak and she might fall, however this improves within 1 to 2 minutes.  Denies dizziness or lightheadedness.  Voiding without difficulty.  Objective: Vital signs in last 24 hours: Temp:  [97.9 F (36.6 C)-98 F (36.7 C)] 98 F (36.7 C) (11/29 0550) Pulse Rate:  [72-105] 87 (11/29 0550) Resp:  [16-17] 16 (11/29 0550) BP: (97-122)/(62-71) 114/71 (11/29 0550) SpO2:  [98 %-99 %] 98 % (11/29 0550)  Physical Exam:  General: alert, cooperative, and appears stated age 23: appropriate Uterine Fundus: firm Incision: healing well DVT Evaluation: No evidence of DVT seen on physical exam.  Recent Labs    11/22/24 1249 11/23/24 1202  HGB 8.3* 8.1*  HCT 25.4* 24.8*   Results for orders placed or performed during the hospital encounter of 11/20/24 (from the past 24 hours)  CBC     Status: Abnormal   Collection Time: 11/23/24 12:02 PM  Result Value Ref Range   WBC 14.9 (H) 4.0 - 10.5 K/uL   RBC 3.22 (L) 3.87 - 5.11 MIL/uL   Hemoglobin 8.1 (L) 12.0 - 15.0 g/dL   HCT 75.1 (L) 63.9 - 53.9 %   MCV 77.0 (L) 80.0 - 100.0 fL   MCH 25.2 (L) 26.0 - 34.0 pg   MCHC 32.7 30.0 - 36.0 g/dL   RDW 84.0 (H) 88.4 - 84.4 %   Platelets 268 150 - 400 K/uL   nRBC 0.0 0.0 - 0.2 %  Comprehensive metabolic panel     Status: Abnormal   Collection Time: 11/23/24 12:02 PM  Result Value Ref Range   Sodium 140 135 - 145 mmol/L   Potassium 4.2 3.5 - 5.1 mmol/L   Chloride 110 98 - 111 mmol/L   CO2 21 (L) 22 - 32 mmol/L   Glucose, Bld 86 70 - 99 mg/dL   BUN 13 6 - 20 mg/dL   Creatinine, Ser 8.97 (H) 0.44 - 1.00 mg/dL   Calcium 8.1 (L) 8.9 - 10.3 mg/dL   Total Protein 5.2 (L) 6.5 - 8.1 g/dL   Albumin 2.0 (L) 3.5 - 5.0 g/dL   AST 24 15 - 41 U/L   ALT 16 0 - 44 U/L   Alkaline Phosphatase 164 (H) 38 - 126 U/L   Total Bilirubin 0.4 0.0 -  1.2 mg/dL   GFR, Estimated >39 >39 mL/min   Anion gap 9 5 - 15     Assessment/Plan: - Status post Cesarean section. Doing well postoperatively.  - Continue current care. - Acute blood loss anemia: Status post 2 units packed red cells.  Continue p.o. iron.  Hemoglobin stable -AKI: Creatinine 1.02, continues to improve - Desires neonatal circumcision, R/B/A of procedure discussed at length. Pt understands that neonatal circumcision is not considered medically necessary and is elective. The risks include, but are not limited to bleeding, infection, damage to the penis, development of scar tissue, and having to have it redone at a later date. Pt understands theses risks and wishes to proceed - Patient did not feel comfortable going home today.  Anticipate discharge home tomorrow  Marjorie Gull, MD 11/23/2024, 1:30 PM

## 2024-11-24 ENCOUNTER — Other Ambulatory Visit (HOSPITAL_COMMUNITY): Payer: Self-pay

## 2024-11-24 MED ORDER — OXYCODONE HCL 5 MG PO TABS
5.0000 mg | ORAL_TABLET | ORAL | 0 refills | Status: AC | PRN
  Filled 2024-11-24: qty 30, 3d supply, fill #0

## 2024-11-24 MED ORDER — ACETAMINOPHEN 500 MG PO TABS
1000.0000 mg | ORAL_TABLET | Freq: Four times a day (QID) | ORAL | 0 refills | Status: AC
  Filled 2024-11-24: qty 90, 12d supply, fill #0

## 2024-11-24 MED ORDER — GABAPENTIN 100 MG PO CAPS
200.0000 mg | ORAL_CAPSULE | Freq: Two times a day (BID) | ORAL | 0 refills | Status: AC
  Filled 2024-11-24: qty 120, 30d supply, fill #0

## 2024-11-24 MED ORDER — CYCLOBENZAPRINE HCL 5 MG PO TABS
5.0000 mg | ORAL_TABLET | Freq: Three times a day (TID) | ORAL | 1 refills | Status: AC | PRN
  Filled 2024-11-24: qty 30, 10d supply, fill #0

## 2024-11-24 NOTE — Discharge Summary (Signed)
 Postpartum Discharge Summary       Patient Name: Leslie [name] DOB: 10-Jul-2001 MRN: [id number]  Date of admission: 11/20/2024 Delivery date:11/21/2024 Delivering provider: CLAIRE RAMAN A Date of discharge: 11/24/2024  Admitting diagnosis: Normal labor and delivery [O80] Pregnancy [Z34.90] Intrauterine pregnancy: [redacted]w[redacted]d     Secondary diagnosis:  Principal Problem:   Normal labor and delivery Active Problems:   Pregnancy  Additional problems: ADHD, anxiety/depression, acute fatty liver disease, BMI 39   Discharge diagnosis: Term Pregnancy Delivered , acute kidney injury, acute blood loss anemia                                            Post partum procedures:blood transfusion Augmentation: Pitocin  Complications: None  Hospital course: Onset of Labor With Unplanned C/S   23 y.o. yo G1P1001 at [redacted]w[redacted]d was admitted in Latent Labor on 11/20/2024. Patient had a labor course significant for arrest of descent. The patient went for cesarean section due to Arrest of Descent. Delivery details as follows: Membrane Rupture Time/Date: 4:33 PM,11/20/2024  Delivery Method:C-Section, Low Transverse Operative Delivery:N/A Details of operation can be found in separate operative note. Patient had a postpartum course complicated by postpartum hemorrhage.  She received 2 units of packed red cells.  She experienced a acute kidney injury with a bump in her creatinine felt to be due to hypovolemia.  This improved over her hospital stay.  She is ambulating,tolerating a regular diet, passing flatus, and urinating well.  Patient is discharged home in stable condition 11/24/24.   Newborn Data: Birth date:11/21/2024 Birth time:11:30 AM Gender:Female Living status:Living Apgars:8 ,9  Weight:3590 g  Magnesium Sulfate received: No BMZ received: No Rhophylac:N/A Transfusion:Yes Immunizations administered: Immunization History  Administered Date(s) Administered    sv, Bivalent, Protein Subunit  Rsvpref,pf (Abrysvo ) 10/07/2024   Influenza, Seasonal, Injecte, Preservative Fre 09/07/2024   Influenza,inj,Quad PF,6+ Mos 09/11/2020   Pfizer(Comirnaty )Fall Seasonal Vaccine 12 years and older 09/07/2024    Physical exam  Vitals:   11/23/24 1331 11/23/24 1339 11/23/24 2010 11/24/24 0520  BP: 128/82 103/64 115/75 114/76  Pulse: 89 67 83   Resp: 16 16 16 16   Temp: 97.7 F (36.5 C) 97.7 F (36.5 C) 98 F (36.7 C) 98.4 F (36.9 C)  TempSrc: Oral Oral Oral Oral  SpO2: 99% 100% 100% 99%  Weight:      Height:       General: alert, cooperative, and no distress Lochia: appropriate Uterine Fundus: firm Incision: Healing well with no significant drainage DVT Evaluation: No evidence of DVT seen on physical exam. Labs: Lab Results  Component Value Date   WBC 14.9 (H) 11/23/2024   HGB 8.1 (L) 11/23/2024   HCT 24.8 (L) 11/23/2024   MCV 77.0 (L) 11/23/2024   PLT 268 11/23/2024      Latest Ref Rng & Units 11/23/2024   12:02 PM  CMP  Glucose 70 - 99 mg/dL 86   BUN 6 - 20 mg/dL 13   Creatinine 9.55 - 1.00 mg/dL 8.97   Sodium 864 - 854 mmol/L 140   Potassium 3.5 - 5.1 mmol/L 4.2   Chloride 98 - 111 mmol/L 110   CO2 22 - 32 mmol/L 21   Calcium 8.9 - 10.3 mg/dL 8.1   Total Protein 6.5 - 8.1 g/dL 5.2   Total Bilirubin 0.0 - 1.2 mg/dL 0.4   Alkaline Phos 38 -  126 U/L 164   AST 15 - 41 U/L 24   ALT 0 - 44 U/L 16    Edinburgh Score:    11/21/2024    2:59 PM  Edinburgh Postnatal Depression Scale Screening Tool  I have been able to laugh and see the funny side of things. 0  I have looked forward with enjoyment to things. 0  I have blamed myself unnecessarily when things went wrong. 1  I have been anxious or worried for no good reason. 0  I have felt scared or panicky for no good reason. 2  Things have been getting on top of me. 1  I have been so unhappy that I have had difficulty sleeping. 0  I have felt sad or miserable. 0  I have been so unhappy that I have been crying. 0   The thought of harming myself has occurred to me. 0  Edinburgh Postnatal Depression Scale Total 4      After visit meds:  Allergies as of 11/24/2024       Reactions   Almond (diagnostic) Hives   Amoxicillin -pot Clavulanate Diarrhea   GI upset        Medication List     STOP taking these medications    Abrysvo  120 MCG/0.5ML injection Generic drug: RSV bivalent vaccine   aspirin EC 81 MG tablet   cetirizine 10 MG tablet Commonly known as: ZYRTEC   Clindamycin -Benzoyl Per (Refr) gel   Cranberry 1000 MG Caps   omeprazole  20 MG capsule Commonly known as: PRILOSEC   ondansetron  4 MG disintegrating tablet Commonly known as: ZOFRAN -ODT   promethazine -dextromethorphan 6.25-15 MG/5ML syrup Commonly known as: PROMETHAZINE -DM   Ryaltris  665-25 MCG/ACT Susp Generic drug: Olopatadine-Mometasone   sucralfate  1 g tablet Commonly known as: Carafate    tretinoin  0.05 % cream Commonly known as: RETIN-A        TAKE these medications    acetaminophen  500 MG tablet Commonly known as: TYLENOL  Take 2 tablets (1,000 mg total) by mouth every 6 (six) hours.   Aerochamber Plus Device Use as directed   albuterol  108 (90 Base) MCG/ACT inhaler Commonly known as: VENTOLIN  HFA Inhale 1-2 puffs into the lungs every 4 (four) hours if needed.   cyanocobalamin  1000 MCG tablet Commonly known as: VITAMIN B12 Take 1,000 mcg by mouth daily.   cyclobenzaprine 5 MG tablet Commonly known as: FLEXERIL Take 1 tablet (5 mg total) by mouth 3 (three) times daily as needed for muscle spasms.   EPINEPHrine  0.3 mg/0.3 mL Soaj injection Commonly known as: EPI-PEN Inject 0.3 mg into the muscle as needed for anaphylaxis.   gabapentin  100 MG capsule Commonly known as: NEURONTIN  Take 2 capsules (200 mg total) by mouth 2 (two) times daily.   lisdexamfetamine 30 MG capsule Commonly known as: Vyvanse  Take 1 capsule (30 mg total) by mouth in the morning.   multivitamin-prenatal 27-0.8 MG  Tabs tablet Take 1 tablet by mouth daily at 12 noon.   oxyCODONE  5 MG immediate release tablet Commonly known as: Oxy IR/ROXICODONE  Take 1-2 tablets (5-10 mg total) by mouth every 4 (four) hours as needed for moderate pain (pain score 4-6).   sertraline  100 MG tablet Commonly known as: ZOLOFT  Take 1 tablet (100 mg total) by mouth daily.   Vitamin D3 50 MCG (2000 UT) Tabs Take 2,000 Units by mouth daily.               Discharge Care Instructions  (From admission, onward)  Start     Ordered   11/24/24 0000  Discharge wound care:       Comments: For a cesarean delivery: You may wash incision with soap and water .  Do not soak or submerge the incision for 2 weeks. Keep incision dry. You may need to keep a sanitary pad or panty liner between the incision and your clothing for comfort and to keep the incision dry. If you note drainage, increased pain, or increased redness of the incision, then please notify your physician.   11/24/24 1302   11/24/24 0000  If the dressing is still on your incision site when you go home, remove it on the third day after your surgery date. Remove dressing if it begins to fall off, or if it is dirty or damaged before the third day.       Comments: For a cesarean delivery   11/24/24 1302             Discharge home in stable condition Infant Feeding: Bottle and Breast Infant Disposition:home with mother Discharge instruction: per After Visit Summary and Postpartum booklet. Activity: Advance as tolerated. Pelvic rest for 6 weeks.  Diet: routine diet Anticipated Birth Control: Unsure Postpartum Appointment:4 weeks Future Appointments:No future appointments. Follow up Visit:  Follow-up Information     Ob/Gyn, Landy Stains Follow up in 4 week(s).   Why: For a postpartum evaluation Contact information: [address] [place] [address] [phone]                     11/24/2024 Marjorie Gull,  MD

## 2024-11-25 ENCOUNTER — Other Ambulatory Visit (HOSPITAL_BASED_OUTPATIENT_CLINIC_OR_DEPARTMENT_OTHER): Payer: Self-pay

## 2024-11-25 LAB — SURGICAL PATHOLOGY

## 2024-12-03 ENCOUNTER — Telehealth (HOSPITAL_COMMUNITY): Payer: Self-pay | Admitting: *Deleted

## 2024-12-03 ENCOUNTER — Encounter: Payer: Self-pay | Admitting: Nurse Practitioner

## 2024-12-03 NOTE — Telephone Encounter (Signed)
 12/03/2024  Name: Leslie [name] MRN: [id number] DOB: 10-06-2001  Reason for Call:  Transition of Care Hospital Discharge Call  Contact Status: Patient Contact Status: Complete  Language assistant needed: Interpreter Mode: Interpreter Not Needed        Follow-Up Questions: Do You Have Any Concerns About Your Health As You Heal From Delivery?: No Do You Have Any Concerns About Your Infants Health?: No  Edinburgh Postnatal Depression Scale:  In the Past 7 Days: I have been able to laugh and see the funny side of things.: As much as I always could I have looked forward with enjoyment to things.: As much as I ever did I have blamed myself unnecessarily when things went wrong.: No, never I have been anxious or worried for no good reason.: Yes, sometimes I have felt scared or panicky for no good reason.: No, not at all Things have been getting on top of me.: No, I have been coping as well as ever I have been so unhappy that I have had difficulty sleeping.: Not at all I have felt sad or miserable.: No, not at all I have been so unhappy that I have been crying.: No, never The thought of harming myself has occurred to me.: Never Edinburgh Postnatal Depression Scale Total: 2  PHQ2-9 Depression Scale:     Discharge Follow-up: Edinburgh score requires follow up?: No Patient was advised of the following resources:: Support Group, Breastfeeding Support Group  Post-discharge interventions: Reviewed Newborn Safe Sleep Practices  Mliss Sieve, RN 12/03/2024 12:23

## 2024-12-04 ENCOUNTER — Other Ambulatory Visit (HOSPITAL_BASED_OUTPATIENT_CLINIC_OR_DEPARTMENT_OTHER): Payer: Self-pay

## 2024-12-04 DIAGNOSIS — R300 Dysuria: Secondary | ICD-10-CM | POA: Diagnosis not present

## 2024-12-04 MED ORDER — NITROFURANTOIN MONOHYD MACRO 100 MG PO CAPS
100.0000 mg | ORAL_CAPSULE | Freq: Two times a day (BID) | ORAL | 0 refills | Status: AC
  Filled 2024-12-04: qty 10, 5d supply, fill #0

## 2024-12-05 ENCOUNTER — Encounter (HOSPITAL_COMMUNITY): Payer: Self-pay

## 2024-12-05 ENCOUNTER — Other Ambulatory Visit (HOSPITAL_COMMUNITY): Payer: Self-pay

## 2024-12-05 ENCOUNTER — Other Ambulatory Visit: Payer: Self-pay | Admitting: Physician Assistant

## 2024-12-05 DIAGNOSIS — K2970 Gastritis, unspecified, without bleeding: Secondary | ICD-10-CM

## 2024-12-06 ENCOUNTER — Encounter (HOSPITAL_COMMUNITY): Payer: Self-pay

## 2024-12-06 ENCOUNTER — Inpatient Hospital Stay (HOSPITAL_COMMUNITY)

## 2024-12-17 ENCOUNTER — Other Ambulatory Visit (HOSPITAL_BASED_OUTPATIENT_CLINIC_OR_DEPARTMENT_OTHER): Payer: Self-pay

## 2024-12-25 ENCOUNTER — Other Ambulatory Visit: Payer: Self-pay

## 2024-12-25 ENCOUNTER — Other Ambulatory Visit (HOSPITAL_BASED_OUTPATIENT_CLINIC_OR_DEPARTMENT_OTHER): Payer: Self-pay

## 2024-12-25 MED ORDER — ZURZUVAE 25 MG PO CAPS
50.0000 mg | ORAL_CAPSULE | Freq: Every day | ORAL | 0 refills | Status: AC
  Filled 2024-12-25 – 2024-12-27 (×2): qty 28, 14d supply, fill #0

## 2024-12-27 ENCOUNTER — Other Ambulatory Visit (HOSPITAL_BASED_OUTPATIENT_CLINIC_OR_DEPARTMENT_OTHER): Payer: Self-pay

## 2024-12-27 ENCOUNTER — Other Ambulatory Visit: Payer: Self-pay

## 2024-12-27 ENCOUNTER — Other Ambulatory Visit (HOSPITAL_COMMUNITY): Payer: Self-pay

## 2024-12-30 ENCOUNTER — Other Ambulatory Visit (HOSPITAL_BASED_OUTPATIENT_CLINIC_OR_DEPARTMENT_OTHER): Payer: Self-pay

## 2024-12-30 ENCOUNTER — Other Ambulatory Visit (HOSPITAL_COMMUNITY): Payer: Self-pay

## 2024-12-30 ENCOUNTER — Encounter (HOSPITAL_COMMUNITY): Payer: Self-pay

## 2025-01-02 ENCOUNTER — Other Ambulatory Visit (HOSPITAL_BASED_OUTPATIENT_CLINIC_OR_DEPARTMENT_OTHER): Payer: Self-pay

## 2025-01-11 ENCOUNTER — Other Ambulatory Visit (HOSPITAL_COMMUNITY): Payer: Self-pay

## 2025-01-13 ENCOUNTER — Other Ambulatory Visit (HOSPITAL_BASED_OUTPATIENT_CLINIC_OR_DEPARTMENT_OTHER): Payer: Self-pay

## 2025-01-13 ENCOUNTER — Other Ambulatory Visit (INDEPENDENT_AMBULATORY_CARE_PROVIDER_SITE_OTHER): Payer: Self-pay

## 2025-01-13 ENCOUNTER — Ambulatory Visit: Admitting: Orthopedic Surgery

## 2025-01-13 ENCOUNTER — Other Ambulatory Visit (HOSPITAL_COMMUNITY): Payer: Self-pay

## 2025-01-13 DIAGNOSIS — M5412 Radiculopathy, cervical region: Secondary | ICD-10-CM | POA: Diagnosis not present

## 2025-01-13 DIAGNOSIS — M25512 Pain in left shoulder: Secondary | ICD-10-CM

## 2025-01-13 MED ORDER — MELOXICAM 15 MG PO TABS
15.0000 mg | ORAL_TABLET | Freq: Every day | ORAL | 0 refills | Status: AC
  Filled 2025-01-13: qty 21, 21d supply, fill #0

## 2025-01-13 MED ORDER — SERTRALINE HCL 100 MG PO TABS
100.0000 mg | ORAL_TABLET | Freq: Every day | ORAL | 1 refills | Status: AC
  Filled 2025-01-13: qty 90, 90d supply, fill #0

## 2025-01-14 ENCOUNTER — Encounter: Payer: Self-pay | Admitting: Orthopedic Surgery

## 2025-01-14 ENCOUNTER — Other Ambulatory Visit (HOSPITAL_BASED_OUTPATIENT_CLINIC_OR_DEPARTMENT_OTHER): Payer: Self-pay

## 2025-01-14 NOTE — Progress Notes (Signed)
 "  Office Visit Note   Patient: Leslie [name]           Date of Birth: 2001-11-15           MRN: [id number] Visit Date: 01/13/2025 Requested by: Royden Ronal Czar, FNP [address] [place],  [place] [address] PCP: Royden Ronal Czar, FNP  Subjective: Chief Complaint  Patient presents with   Left Shoulder - Pain    HPI: Leslie [name] is a 24 y.o. female who presents to the office reporting left shoulder pain.  Primarily anterior for 3 to 4 weeks.  Denies any history of injury.  She is right-hand dominant.  Patient states that she also has a bulging disc in my neck.  Does describe some radicular pain with numbness and tingling on the palmar aspect of her hand along with some scapular pain.  She is not taking any medication for the problem except for occasional Robaxin ..                ROS: All systems reviewed are negative as they relate to the chief complaint within the history of present illness.  Patient denies fevers or chills.  Assessment & Plan: Visit Diagnoses:  1. Left shoulder pain, unspecified chronicity   2. Radiculopathy, cervical region     Plan: Impression is left shoulder pain and hand numbness.  Could be some bursitis and carpal tunnel or radicular pain from the neck.  Will try her on Mobic  15 mg a day for 3 weeks as well as a volar wrist splint.  If that fails to help then EMG nerve study of the left arm indicated as well as possible scanning of the shoulder  Follow-Up Instructions: No follow-ups on file.   Orders:  Orders Placed This Encounter  Procedures   XR Shoulder Left   XR Cervical Spine 2 or 3 views   Meds ordered this encounter  Medications   meloxicam  (MOBIC ) 15 MG tablet    Sig: Take 1 tablet (15 mg total) by mouth daily for 3 weeks.    Dispense:  21 tablet    Refill:  0      Procedures: No procedures performed   Clinical Data: No additional findings.  Objective: Vital Signs: LMP 01/27/2024 (Approximate)  Comment: Exact date unknown  Physical Exam:  Constitutional: Patient appears well-developed HEENT:  Head: Normocephalic Eyes:EOM are normal Neck: Normal range of motion Cardiovascular: Normal rate Pulmonary/chest: Effort normal Neurologic: Patient is alert Skin: Skin is warm Psychiatric: Patient has normal mood and affect  Ortho Exam: Patient has bilateral 5 out of 5 grip EPL FPL interosseous wrist flexion wrist extension bicep triceps and deltoid strength.  Bilateral palpable radial pulses and no paresthesias C5-T1 in either arm.  Neck range of motion flexion chin to chest with extension approximately 50 degrees with approximately 50 degrees of rotation bilaterally.  No masses lymphadenopathy or skin changes around the neck or shoulder girdle region bilaterally  Left shoulder exam demonstrates full active and passive range of motion with good rotator cuff strength.  Negative bicipital groove tenderness.  No discrete AC joint tenderness is noted.  Negative carpal tunnel compression testing but positive Phalen's testing on the left.  Negative cubital tunnel tenderness on the left hand side.  Specialty Comments:  No specialty comments available.  Imaging: XR Cervical Spine 2 or 3 views Result Date: 01/14/2025 AP lateral cervical spine radiographs reviewed.  Slight loss of lordosis.  No degenerative disc disease facet arthritis or compression  fractures.  XR Shoulder Left Result Date: 01/14/2025 AP outlet axillary lateral radiographs left shoulder reviewed.  No acute fracture.  Shoulder is located.  Acromiohumeral distance is normal.  No significant degenerative changes in the glenohumeral or AC joint.  Visualized lung fields clear.     PMFS History: Patient Active Problem List   Diagnosis Date Noted   Normal labor and delivery 11/20/2024   Pregnancy 11/20/2024   Hepatic steatosis 09/14/2023   Vitamin B12 deficiency 05/10/2018   B12 deficiency 02/08/2018   Past Medical History:   Diagnosis Date   ADHD (attention deficit hyperactivity disorder)    Allergy     Angio-edema    Anxiety    Eczema    Hepatic steatosis    Recurrent upper respiratory infection (URI)    Urticaria     Family History  Adopted: Yes    Past Surgical History:  Procedure Laterality Date   ADENOIDECTOMY     CESAREAN SECTION N/A 11/21/2024   Procedure: CESAREAN DELIVERY;  Surgeon: Claire Rubie LABOR, MD;  Location: [facility];  Service: Obstetrics;  Laterality: N/A;   LIVER BIOPSY  04/2023   MYRINGOTOMY WITH TUBE PLACEMENT     TONSILLECTOMY     tubes     TYMPANOSTOMY TUBE PLACEMENT     UPPER GASTROINTESTINAL ENDOSCOPY     wisdom teeth     Social History   Occupational History   Occupation: Consulting Civil Engineer  Tobacco Use   Smoking status: Never   Smokeless tobacco: Never  Vaping Use   Vaping status: Never Used  Substance and Sexual Activity   Alcohol use: Not Currently   Drug use: No   Sexual activity: Yes    Partners: Male    Birth control/protection: OCP    Comment: intercourse age 50, less than sexual partnes        "

## 2025-01-15 ENCOUNTER — Other Ambulatory Visit (HOSPITAL_BASED_OUTPATIENT_CLINIC_OR_DEPARTMENT_OTHER): Payer: Self-pay

## 2025-01-16 ENCOUNTER — Other Ambulatory Visit: Payer: Self-pay

## 2025-01-17 ENCOUNTER — Other Ambulatory Visit: Payer: Self-pay

## 2025-01-20 ENCOUNTER — Ambulatory Visit: Admitting: Orthopedic Surgery
